# Patient Record
Sex: Female | Born: 1935 | Race: White | Hispanic: No | Marital: Married | Smoking: Never smoker
Health system: Southern US, Community
[De-identification: ages and names within clinical notes are randomized; demographics above are authoritative.]

## PROBLEM LIST (undated history)

## (undated) DIAGNOSIS — M1990 Unspecified osteoarthritis, unspecified site: Secondary | ICD-10-CM

## (undated) DIAGNOSIS — J449 Chronic obstructive pulmonary disease, unspecified: Secondary | ICD-10-CM

## (undated) DIAGNOSIS — M810 Age-related osteoporosis without current pathological fracture: Secondary | ICD-10-CM

## (undated) DIAGNOSIS — M51369 Other intervertebral disc degeneration, lumbar region without mention of lumbar back pain or lower extremity pain: Secondary | ICD-10-CM

## (undated) DIAGNOSIS — M4800 Spinal stenosis, site unspecified: Secondary | ICD-10-CM

## (undated) DIAGNOSIS — C50919 Malignant neoplasm of unspecified site of unspecified female breast: Secondary | ICD-10-CM

## (undated) DIAGNOSIS — M5430 Sciatica, unspecified side: Secondary | ICD-10-CM

## (undated) DIAGNOSIS — M5136 Other intervertebral disc degeneration, lumbar region: Secondary | ICD-10-CM

## (undated) DIAGNOSIS — I10 Essential (primary) hypertension: Secondary | ICD-10-CM

## (undated) DIAGNOSIS — C9200 Acute myeloblastic leukemia, not having achieved remission: Secondary | ICD-10-CM

## (undated) DIAGNOSIS — C801 Malignant (primary) neoplasm, unspecified: Secondary | ICD-10-CM

## (undated) HISTORY — DX: Essential (primary) hypertension: I10

## (undated) HISTORY — PX: ABDOMINAL HYSTERECTOMY: SHX81

## (undated) HISTORY — DX: Malignant (primary) neoplasm, unspecified: C80.1

## (undated) HISTORY — DX: Malignant neoplasm of unspecified site of unspecified female breast: C50.919

## (undated) HISTORY — DX: Sciatica, unspecified side: M54.30

## (undated) HISTORY — DX: Acute myeloblastic leukemia, not having achieved remission: C92.00

---

## 2010-02-24 DIAGNOSIS — E7800 Pure hypercholesterolemia, unspecified: Secondary | ICD-10-CM | POA: Insufficient documentation

## 2013-04-02 ENCOUNTER — Ambulatory Visit: Payer: Self-pay | Admitting: Family Medicine

## 2013-04-09 ENCOUNTER — Encounter: Payer: Self-pay | Admitting: General Surgery

## 2013-04-09 ENCOUNTER — Ambulatory Visit (INDEPENDENT_AMBULATORY_CARE_PROVIDER_SITE_OTHER): Payer: Medicare Other | Admitting: General Surgery

## 2013-04-09 ENCOUNTER — Ambulatory Visit: Payer: Self-pay | Admitting: General Surgery

## 2013-04-09 VITALS — BP 120/80 | HR 80 | Resp 16 | Ht 63.0 in | Wt 162.0 lb

## 2013-04-09 DIAGNOSIS — C50919 Malignant neoplasm of unspecified site of unspecified female breast: Secondary | ICD-10-CM

## 2013-04-09 DIAGNOSIS — C50912 Malignant neoplasm of unspecified site of left female breast: Secondary | ICD-10-CM

## 2013-04-09 DIAGNOSIS — N63 Unspecified lump in unspecified breast: Secondary | ICD-10-CM

## 2013-04-09 LAB — CBC WITH DIFFERENTIAL/PLATELET
HGB: 13.3 g/dL (ref 12.0–16.0)
Lymphocyte #: 2.2 10*3/uL (ref 1.0–3.6)
MCV: 91 fL (ref 80–100)
Monocyte #: 0.5 x10 3/mm (ref 0.2–0.9)
Monocyte %: 6.9 %
Neutrophil #: 4.1 10*3/uL (ref 1.4–6.5)
Neutrophil %: 59.4 %
Platelet: 177 10*3/uL (ref 150–440)
RDW: 14 % (ref 11.5–14.5)

## 2013-04-09 LAB — COMPREHENSIVE METABOLIC PANEL
Albumin: 4.1 g/dL (ref 3.4–5.0)
Anion Gap: 5 — ABNORMAL LOW (ref 7–16)
BUN: 17 mg/dL (ref 7–18)
Bilirubin,Total: 0.5 mg/dL (ref 0.2–1.0)
Chloride: 102 mmol/L (ref 98–107)
Creatinine: 0.76 mg/dL (ref 0.60–1.30)
EGFR (African American): >60
EGFR (Non-African Amer.): >60
Potassium: 4.1 mmol/L (ref 3.5–5.1)
SGOT(AST): 23 U/L (ref 15–37)
Sodium: 138 mmol/L (ref 136–145)
Total Protein: 7.3 g/dL (ref 6.4–8.2)

## 2013-04-09 NOTE — Patient Instructions (Addendum)
.     CARE AFTER BREAST BIOPSY  1. Leave the dressing on that your doctor applied after surgery. It is waterproof. You may bathe, shower and/or swim. The dressing will probably remain intact until your return office visit. If the dressing comes off, you will see small strips of tape against your skin on the incision. Do not remove these strips.  2. You may want to use a gauze,cloth or similar protection in your bra to prevent rubbing against your dressing and incision. This is not necessary, but you may feel more comfortable doing so.  3. It is recommended that you wear a bra day and night to give support to the breast. This will prevent the weight of the breast from pulling on the incision.  4. Your breast will feel hard and lumpy under the incision. Do not be alarmed. This is the underlying stitching of tissue. Softening of this tissue will occur in time.  5. Make sure you call the office and schedule an appointment in one week after your surgery. The office phone number is [phone]. The [facility] may have already done this for you.  6. You will notice about a week after your office visit that the strips of the tape on your incision will begin to loosen. These may then be removed.  7. Report to your doctor any of the following:  * Severe pain not relieved by your pain medication  *Redness of the incision  * Drainage from the incision  *Fever greater than 101 degrees  Follow up with MD based on results.  Patient to have labs and a chest x-ray at [facility] today.

## 2013-04-09 NOTE — Progress Notes (Signed)
Patient ID: Crystal Carpenter, female   DOB: 1924-07-17, 79 y.o.   MRN: [id number]  Chief Complaint  Patient presents with  . Other    mammogram    HPI Crystal Carpenter is a 79 y.o. female Who presents for a breast evaluation. The most recent mammogram was done on 04/02/13 cat 5  Patient does not  perform regular self breast checks and this was her first mammogram.  No family history of breast cancer. Patient felt an left breast mass on the central outer side a left breast after a chiropractic treatment. She felt it two weeks ago.   HPI  Past Medical History  Diagnosis Date  . Hypertension     Past Surgical History  Procedure Laterality Date  . Abdominal hysterectomy      No family history on file.  Social History History  Substance Use Topics  . Smoking status: Never Smoker   . Smokeless tobacco: Never Used  . Alcohol Use: No    Allergies  Allergen Reactions  . Shellfish Allergy Rash    Current Outpatient Prescriptions  Medication Sig Dispense Refill  . hydrochlorothiazide (HYDRODIURIL) 25 MG tablet Take 1 tablet by mouth daily.      . potassium chloride (MICRO-K) 10 MEQ CR capsule Take 1 capsule by mouth daily.      . sertraline (ZOLOFT) 50 MG tablet Take 1 tablet by mouth daily.      . Vitamin D, Ergocalciferol, (DRISDOL) 50000 UNITS CAPS Take 50,000 Units by mouth.       No current facility-administered medications for this visit.    Review of Systems Review of Systems  Constitutional: Negative.   Respiratory: Negative.   Cardiovascular: Negative.     Blood pressure 120/80, pulse 80, resp. rate 16, height 5\' 3"  (1.6 m), weight 162 lb (73.483 kg).  Physical Exam Physical Exam  Constitutional: She is oriented to person, place, and time. She appears well-developed and well-nourished.  Eyes: No scleral icterus.  Cardiovascular: Normal rate, regular rhythm, normal heart sounds and normal pulses.   No edema   Pulmonary/Chest: Breath sounds normal. Right breast  exhibits no inverted nipple, no mass, no nipple discharge, no skin change and no tenderness. Left breast exhibits mass (3cm mass at 2 o'clock). Left breast exhibits no inverted nipple, no nipple discharge, no skin change and no tenderness.  Lymphadenopathy:    She has no cervical adenopathy.    She has no axillary adenopathy.  Neurological: She is alert and oriented to person, place, and time.  Skin: Skin is warm and dry.    Data Reviewed Bilateral mammogram dated April 02, 2013 showed scattered fibroglandular tissue. The right breast was unremarkable. The left breast showed a mass with pleomorphic calcifications. Ultrasound at the 2:00 position showed a 1.5 x 2.0 x 2.7 cm mass suspicious for malignancy. BI-RAD-5.  Ultrasound examination completed in the 2:00 position, 6 centimeters from the nipple confirmed a 1.25 x 1.85 x 2.3 cm hypoechoic mass with dense shadowing and markedly irregular borders. The patient was amenable to core biopsy.  After skin preparation with alcohol 10 cc of 0.5% Xylocaine with 0.25% Marcaine with 1-200,000 units of epinephrine was utilized well-tolerated. ChloraPrep was applied to the skin. A 14-gauge Bard biopsy device was utilized and multiple core samples extracted. No bleeding was noted. The skin defect was closed with benzoin and Steri-Strips followed by Telfa and Tegaderm dressing. A postbiopsy clip was not placed. The procedure was well-tolerated. Instructions were provided. Assessment    Left breast  cancer.     Plan    The patient was notified of the clinical suspicion for malignancy. Arrangements were made for a followup examination early next week for biopsy results and laboratory studies have returned to review options.  The patient's case was briefly present at the [facility] tumor board. There was no interest in neoadjuvant chemotherapy.     Patient to have labs (CBC, Met C, CEA, and CA 27-29) and a chest x-ray PA and lateral at [facility] today. Labs and x-ray  will be completed at [facility] due to patient's request.   We will have the patient return to the office on Monday, 04-13-13, to discuss options.    Crystal Carpenter 04/09/2013, 9:36 PM

## 2013-04-10 ENCOUNTER — Telehealth: Payer: Self-pay | Admitting: General Surgery

## 2013-04-10 LAB — CANCER ANTIGEN 27.29: CA 27.29: 22.4 U/mL (ref 0.0–38.6)

## 2013-04-10 LAB — CEA: CEA: 1.3 ng/mL (ref 0.0–4.7)

## 2013-04-10 NOTE — Telephone Encounter (Signed)
The patient was notified that her biopsy completed yesterday did confirm the clinical impression of breast cancer. She is scheduled to come in on 04/14/2003 to discuss treatment options.

## 2013-04-13 ENCOUNTER — Ambulatory Visit (INDEPENDENT_AMBULATORY_CARE_PROVIDER_SITE_OTHER): Payer: Medicare Other | Admitting: General Surgery

## 2013-04-13 ENCOUNTER — Encounter: Payer: Self-pay | Admitting: General Surgery

## 2013-04-13 VITALS — BP 138/68 | HR 68 | Resp 14 | Ht 63.0 in | Wt 163.0 lb

## 2013-04-13 DIAGNOSIS — C801 Malignant (primary) neoplasm, unspecified: Secondary | ICD-10-CM | POA: Insufficient documentation

## 2013-04-13 DIAGNOSIS — C50912 Malignant neoplasm of unspecified site of left female breast: Secondary | ICD-10-CM

## 2013-04-13 DIAGNOSIS — C50919 Malignant neoplasm of unspecified site of unspecified female breast: Secondary | ICD-10-CM

## 2013-04-14 ENCOUNTER — Encounter: Payer: Self-pay | Admitting: General Surgery

## 2013-04-14 DIAGNOSIS — C50919 Malignant neoplasm of unspecified site of unspecified female breast: Secondary | ICD-10-CM

## 2013-04-14 DIAGNOSIS — C801 Malignant (primary) neoplasm, unspecified: Secondary | ICD-10-CM

## 2013-04-14 HISTORY — DX: Malignant neoplasm of unspecified site of unspecified female breast: C50.919

## 2013-04-14 HISTORY — DX: Malignant (primary) neoplasm, unspecified: C80.1

## 2013-04-14 NOTE — Progress Notes (Signed)
Patient ID: Crystal Carpenter, female   DOB: [birth year], 79 y.o.   MRN: [id number]   The patient returns today accompanied by her daughter, Crystal Carpenter, as well as a close friend who is present at the time of her biopsy to discuss her biopsy results. She had been contacted by phone yesterday with the results.  The patient shows minimal bruising from the biopsy.  Dispense better part 45 minutes to an hour to review the options for management. He she is aware that there is no research data for patient center age bracket, but considering her actual functional status she should be treated as a younger woman.  Breast conservation and mastectomy were presented is equivalent. The role of adjuvant radiation therapy was discussed. The possibility but not necessitate of adjuvant chemotherapy with reviewed.  An informational brochure was provided.  Baseline laboratory studies will be appropriate, and arrangements will be made for these to be completed in the near future.  The patient's daughter as the possibility of preoperative body imaging to look for metastatic disease. I explained it typically this is done only of preoperative laboratory chest x-ray studies are abnormal and then based on nodal status.  The patient will consider her options and notify the office of how she would like to proceed.

## 2013-04-16 LAB — PATHOLOGY

## 2013-04-20 ENCOUNTER — Other Ambulatory Visit: Payer: Self-pay | Admitting: General Surgery

## 2013-04-20 ENCOUNTER — Telehealth: Payer: Self-pay | Admitting: *Deleted

## 2013-04-20 DIAGNOSIS — C50912 Malignant neoplasm of unspecified site of left female breast: Secondary | ICD-10-CM

## 2013-04-20 NOTE — Telephone Encounter (Signed)
Patient has been scheduled for surgery at [facility] for 04-28-13. She is aware of all instructions and will call if she has further questions.

## 2013-04-22 ENCOUNTER — Ambulatory Visit: Payer: Self-pay | Admitting: General Surgery

## 2013-04-28 ENCOUNTER — Ambulatory Visit: Payer: Self-pay | Admitting: General Surgery

## 2013-04-28 DIAGNOSIS — C50419 Malignant neoplasm of upper-outer quadrant of unspecified female breast: Secondary | ICD-10-CM

## 2013-04-28 HISTORY — PX: BREAST SURGERY: SHX581

## 2013-04-29 ENCOUNTER — Encounter: Payer: Self-pay | Admitting: General Surgery

## 2013-05-01 ENCOUNTER — Ambulatory Visit (INDEPENDENT_AMBULATORY_CARE_PROVIDER_SITE_OTHER): Payer: Medicare Other | Admitting: *Deleted

## 2013-05-01 ENCOUNTER — Encounter: Payer: Self-pay | Admitting: General Surgery

## 2013-05-01 DIAGNOSIS — C50912 Malignant neoplasm of unspecified site of left female breast: Secondary | ICD-10-CM

## 2013-05-01 DIAGNOSIS — C50919 Malignant neoplasm of unspecified site of unspecified female breast: Secondary | ICD-10-CM

## 2013-05-01 NOTE — Progress Notes (Signed)
Patient came in today for an wound check on left mastectomy site. Changed and cleaned the area,added fluff and applied a wrapping of gauzes than an ace bandage was applied. Patient stated she has not had any pain since surgery. Will follow up on Tuesday with the nurse.

## 2013-05-05 ENCOUNTER — Ambulatory Visit (INDEPENDENT_AMBULATORY_CARE_PROVIDER_SITE_OTHER): Payer: Medicare Other | Admitting: *Deleted

## 2013-05-05 DIAGNOSIS — C50912 Malignant neoplasm of unspecified site of left female breast: Secondary | ICD-10-CM

## 2013-05-05 DIAGNOSIS — C50919 Malignant neoplasm of unspecified site of unspecified female breast: Secondary | ICD-10-CM

## 2013-05-05 NOTE — Patient Instructions (Addendum)
As scheduled with Dr Lemar Livings May use heating pad for comfort twice a day Continue to record JP drain output

## 2013-05-05 NOTE — Progress Notes (Addendum)
Patient came in today for an wound check on left mastectomy site. Area clean and no signs of infection. Changed and cleaned the area,added fluff and applied a wrapping of gauzes than an ace bandage was applied. Patient stated she has not had any pain since surgery. JP drain sheet present, output over 30ml a day

## 2013-05-11 ENCOUNTER — Ambulatory Visit (INDEPENDENT_AMBULATORY_CARE_PROVIDER_SITE_OTHER): Payer: Medicare Other | Admitting: General Surgery

## 2013-05-11 DIAGNOSIS — C50919 Malignant neoplasm of unspecified site of unspecified female breast: Secondary | ICD-10-CM

## 2013-05-11 DIAGNOSIS — C50912 Malignant neoplasm of unspecified site of left female breast: Secondary | ICD-10-CM

## 2013-05-12 ENCOUNTER — Encounter: Payer: Self-pay | Admitting: General Surgery

## 2013-05-12 NOTE — Progress Notes (Signed)
The patient presented for wound evaluation. The drain volume is still greater than 30 cc per day.  Examination of the incision shows good healing. No evidence of likely the fluid. No erythema or induration.  The compressive wrap will be discontinued.

## 2013-05-13 ENCOUNTER — Ambulatory Visit (INDEPENDENT_AMBULATORY_CARE_PROVIDER_SITE_OTHER): Payer: Medicare Other | Admitting: General Surgery

## 2013-05-13 ENCOUNTER — Encounter: Payer: Self-pay | Admitting: General Surgery

## 2013-05-13 VITALS — BP 150/78 | HR 72 | Resp 14 | Ht 65.0 in | Wt 164.0 lb

## 2013-05-13 DIAGNOSIS — C50912 Malignant neoplasm of unspecified site of left female breast: Secondary | ICD-10-CM

## 2013-05-13 DIAGNOSIS — C50919 Malignant neoplasm of unspecified site of unspecified female breast: Secondary | ICD-10-CM

## 2013-05-13 NOTE — Patient Instructions (Addendum)
Call if any problems. Resume regular activities. No heavy lifting with the left arm.

## 2013-05-13 NOTE — Progress Notes (Signed)
Patient ID: Crystal Carpenter, female   DOB: 09-Jan-1924, 79 y.o.   MRN: [id number]  Chief Complaint  Patient presents with  . Follow-up    post op mastectomy    HPI Crystal Carpenter is a 79 y.o. female here for Post Op left mastectomy. The surgery was done on 04/28/13. Patient is doing well and denies pain.   HPI  Past Medical History  Diagnosis Date  . Hypertension   . Cancer 2014    left breast     Past Surgical History  Procedure Laterality Date  . Abdominal hysterectomy      No family history on file.  Social History History  Substance Use Topics  . Smoking status: Never Smoker   . Smokeless tobacco: Never Used  . Alcohol Use: No    Allergies  Allergen Reactions  . Shellfish Allergy Rash    Current Outpatient Prescriptions  Medication Sig Dispense Refill  . hydrochlorothiazide (HYDRODIURIL) 25 MG tablet Take 1 tablet by mouth daily.      . potassium chloride (MICRO-K) 10 MEQ CR capsule Take 1 capsule by mouth daily.      . sertraline (ZOLOFT) 50 MG tablet Take 1 tablet by mouth daily.      . Vitamin D, Ergocalciferol, (DRISDOL) 50000 UNITS CAPS Take 50,000 Units by mouth.       No current facility-administered medications for this visit.    Review of Systems Review of Systems  Constitutional: Negative.   Respiratory: Negative.   Cardiovascular: Negative.     Blood pressure 150/78, pulse 72, resp. rate 14, height 5\' 5"  (1.651 m), weight 164 lb (74.39 kg).  Physical Exam Physical Exam Examination of the mastectomy site shows no evidence of loculated fluid. The previously placed JP drain was removed 2 days ago. Excellent shoulder range of motion.  Data Reviewed Pathology showed 6 axillary nodes positive.  Assessment    Doing well status post left modified radical mastectomy.     Plan    The patient was accompanied today by her daughter and a close friend. Her case was presented at the [facility] tumor board last week. Recommendations were for chest  wall/peripheral lymphatic radiation if metastatic disease was not identified, anti-estrogen therapy only if additional metastatic disease was detected on a PET CT. No plans for adjuvant chemotherapy if metastatic disease was identified was recommended. I see little advantage to chest wall radiation for this patient at this age. Options for formal medical oncology/radiation oncology consultation was offered. Otherwise, antiestrogen therapy with an aromatase inhibitor would be appropriate.  The patient will consider her options and will review them at her follow up in approximately 10 days.  She may resume driving if she feels comfortable incompetent and her ability to control a car.       [name] 05/13/2013, 9:10 PM

## 2013-05-25 ENCOUNTER — Encounter: Payer: Self-pay | Admitting: General Surgery

## 2013-05-25 ENCOUNTER — Ambulatory Visit (INDEPENDENT_AMBULATORY_CARE_PROVIDER_SITE_OTHER): Payer: Medicare Other | Admitting: General Surgery

## 2013-05-25 VITALS — BP 140/72 | HR 72 | Resp 12 | Ht 62.0 in | Wt 160.0 lb

## 2013-05-25 DIAGNOSIS — C50912 Malignant neoplasm of unspecified site of left female breast: Secondary | ICD-10-CM

## 2013-05-25 DIAGNOSIS — M810 Age-related osteoporosis without current pathological fracture: Secondary | ICD-10-CM

## 2013-05-25 DIAGNOSIS — C50919 Malignant neoplasm of unspecified site of unspecified female breast: Secondary | ICD-10-CM

## 2013-05-25 MED ORDER — LETROZOLE 2.5 MG PO TABS: 2.5000 mg | ORAL_TABLET | Freq: Every day | ORAL | Status: DC

## 2013-05-25 NOTE — Patient Instructions (Addendum)
Patient to return in one month.  This patient is to have a bone density test at the [facility] on 05-26-13 at 1:30 pm. She is aware of date, time, and instructions.

## 2013-05-25 NOTE — Progress Notes (Signed)
Patient ID: [name], female   DOB: 1924/02/05, 79 y.o.   MRN: [id number]  Chief Complaint  Patient presents with  . Other    breast ca    HPI Crystal Carpenter is a 79 y.o. female here today for following up from breast cancer.Post op left mastectomy. The surgery was done on 04/28/13. Patient is doing well and denies pain.   HPI  Past Medical History  Diagnosis Date  . Hypertension   . Cancer 2014    left breast     Past Surgical History  Procedure Laterality Date  . Abdominal hysterectomy      No family history on file.  Social History History  Substance Use Topics  . Smoking status: Never Smoker   . Smokeless tobacco: Never Used  . Alcohol Use: No    Allergies  Allergen Reactions  . Tape Rash  . Shellfish Allergy Rash    Current Outpatient Prescriptions  Medication Sig Dispense Refill  . hydrochlorothiazide (HYDRODIURIL) 25 MG tablet Take 1 tablet by mouth daily.      . potassium chloride (MICRO-K) 10 MEQ CR capsule Take 1 capsule by mouth daily.      . sertraline (ZOLOFT) 50 MG tablet Take 1 tablet by mouth daily.      . Vitamin D, Ergocalciferol, (DRISDOL) 50000 UNITS CAPS Take 50,000 Units by mouth.      . letrozole (FEMARA) 2.5 MG tablet Take 1 tablet (2.5 mg total) by mouth daily.  30 tablet  11   No current facility-administered medications for this visit.    Review of Systems Review of Systems  Constitutional: Negative.   Respiratory: Negative.   Cardiovascular: Negative.     Blood pressure 140/72, pulse 72, resp. rate 12, height 5\' 2"  (1.575 m), weight 160 lb (72.576 kg).  Physical Exam Physical Exam Examination of the mastectomy site showed no nausea loculated fluid.  Full range of shoulder motion.  There is slight fullness in the lower aspect of the left forearm. The patient reports that she is appreciated full dose in this area for years, well prior to her recent surgery.  Measurement of the upper extremities L. Location 15 cm superior  to the olecranon process as well as 10 and 20 cm below the olecranon process shielded the following results:  Right side: 33, 23, 17 centimeters.  Left side, 33, 26, 19 centimeters.  Data Reviewed No new data.  Assessment    Left breast cancer.     Plan    Options for referral to medical oncology/radiation oncology based on the [facility] Tumor board review versus proceeding with an aromatase inhibitor alone was discussed with the patient. She was still a little ambivalent about how she would like to proceed.   We'll go ahead and send a prescription for Femara 2.5 mg to be taken daily to her pharmacy. A bone density test will be scheduled. We'll plan for a followup examination in one month. Should she desire formal medical/radiation oncology evaluation she was encouraged to call.    This patient is to have a bone density test at the [facility] on 05-26-13 at 1:30 pm. She is aware of date, time, and instructions.    [name] 05/25/2013, 8:19 PM

## 2013-05-26 ENCOUNTER — Ambulatory Visit: Payer: Self-pay | Admitting: General Surgery

## 2013-05-26 LAB — HER-2 / NEU, FISH
Avg Num CEP17 probes/nucleus:: 2.5
HER-2/CEP17 Ratio: 1.38
Number of Observers:: 2
Number of Tumor Cells Counted:: 40

## 2013-05-26 LAB — ER/PR,IMMUNOHISTOCHEM,PARAFFIN
Estrogen Receptor IHC: >90 %
Progesterone Recp IP: 20 %

## 2013-06-30 ENCOUNTER — Encounter: Payer: Self-pay | Admitting: General Surgery

## 2013-06-30 ENCOUNTER — Ambulatory Visit (INDEPENDENT_AMBULATORY_CARE_PROVIDER_SITE_OTHER): Payer: Medicare Other | Admitting: General Surgery

## 2013-06-30 VITALS — BP 148/72 | HR 78 | Resp 14 | Ht 64.0 in | Wt 159.0 lb

## 2013-06-30 DIAGNOSIS — Z853 Personal history of malignant neoplasm of breast: Secondary | ICD-10-CM

## 2013-06-30 NOTE — Progress Notes (Signed)
Patient ID: Crystal Carpenter, female   DOB: 08-27-1924, 79 y.o.   MRN: [id number]  Chief Complaint  Patient presents with  . Follow-up    1 month follow up breast cancer    HPI Crystal Carpenter is a 79 y.o. female who presents for a 1 month follow up left breast cancer. The patient denies any new problems with her breast at this time. The patient reports that the numbness previously troubling her in the left axilla is improving. She is now able to put on her own deodorant. She was concerned about the mild swelling in the left forearm, and had discussed a sleeve with her primary care physician.  The patient is not having any difficulty with shoulder range of motion. She is tolerating the Femara well, but did have a question of whether it might be interfering with her sleep. Her daughter, who accompanied her today, reported that her mother is less energetic than she was before surgery, and is taking a couple hours rest in the afternoon. This may be disrupting her night sleep cycle.  When she last saw Dr. Sullivan Lone he encouraged her to increase her Zoloft back to 50 mg daily. This was started yesterday.  HPI  Past Medical History  Diagnosis Date  . Hypertension   . 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 Abdominal hysterectomy      History reviewed. No pertinent family history.  Social History History  Substance Use Topics  . Smoking status: Never Smoker   . Smokeless tobacco: Never Used  . Alcohol Use: No    Allergies  Allergen Reactions  . Tape Rash  . Shellfish Allergy Rash    Current Outpatient Prescriptions  Medication Sig Dispense Refill  . hydrochlorothiazide (HYDRODIURIL) 25 MG tablet Take 1 tablet by mouth daily.      [name] letrozole (FEMARA) 2.5 MG tablet Take 1 tablet (2.5 mg total) by mouth daily.  30 tablet  11  . potassium chloride (MICRO-K) 10 MEQ CR capsule Take 1  capsule by mouth daily.      . sertraline (ZOLOFT) 50 MG tablet Take 1 tablet by mouth daily.      . Vitamin D, Ergocalciferol, (DRISDOL) 50000 UNITS CAPS Take 50,000 Units by mouth.       No current facility-administered medications for this visit.    Review of Systems Review of Systems  Constitutional: Negative.   Respiratory: Negative.   Cardiovascular: Negative.     Blood pressure 148/72, pulse 78, resp. rate 14, height 5\' 4"  (1.626 m), weight 159 lb (72.122 kg).  Physical Exam Physical Exam  Constitutional: She is oriented to person, place, and time. She appears well-developed and well-nourished.  Neck: Neck supple.  Lymphadenopathy:    She has no cervical adenopathy.  Neurological: She is alert and oriented to person, place, and time.   She shows full range of motion on the left side.  The mastectomy site shows some soft irregularity, but no evidence of loculated fluid.  Measurement of the upper extremities 15 cm above as well as 10 and 20 cm below the olecranon process showed measurements as follows:  Right side: 31, 24 and 17 cm. Left side: 34, 26 and 19. The left side measurements show a 1 cm increase in the upper arm and stable measurements on the lower arm.     Data Reviewed No new data.  Assessment  Mild and nonprogressive left upper extremity edema  Tolerating Femara fairly well.      Plan    I again spoke to the patient whether she desired a formal medical oncology assessment. At this time she is still on the fence, so we'll continue to administer Femara at 2.5 mg daily.  With the modest and nonprogressive upper extremity swelling on the left, I think she would be far more frustrated by a compressive sleeve than benefit. I suggest we reassess the swelling in 3 months and make a decision at that time.  She was given a prescription for a left breast prosthesis and surgical bras x4.        [name] 06/30/2013, 8:36 PM

## 2013-10-15 DIAGNOSIS — M5430 Sciatica, unspecified side: Secondary | ICD-10-CM

## 2013-10-15 HISTORY — DX: Sciatica, unspecified side: M54.30

## 2013-10-15 HISTORY — PX: EPIDURAL BLOCK INJECTION: SHX1516

## 2013-10-22 ENCOUNTER — Ambulatory Visit: Payer: Medicare Other | Admitting: General Surgery

## 2013-11-04 ENCOUNTER — Encounter: Payer: Self-pay | Admitting: General Surgery

## 2013-11-04 ENCOUNTER — Ambulatory Visit (INDEPENDENT_AMBULATORY_CARE_PROVIDER_SITE_OTHER): Payer: Medicare Other | Admitting: General Surgery

## 2013-11-04 VITALS — BP 142/70 | HR 72 | Resp 12 | Ht 64.0 in | Wt 159.0 lb

## 2013-11-04 DIAGNOSIS — Z853 Personal history of malignant neoplasm of breast: Secondary | ICD-10-CM

## 2013-11-04 NOTE — Progress Notes (Signed)
Patient ID: Crystal Carpenter, female   DOB: [birth year], 80 y.o.   MRN: [id number]  Chief Complaint  Patient presents with  . Follow-up    breast cancer    HPI Crystal Carpenter is a 80 y.o. female.  Here today for follow up breast cancer. No new breast complaints.  States she is tolerating Femara she just notices that she gets tired easily.  HPI  Past Medical History  Diagnosis Date  . Hypertension   . 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 Abdominal hysterectomy    . Breast surgery Left 04-28-13    left mastectomy, SN biopsy    No family history on file.  Social History History  Substance Use Topics  . Smoking status: Never Smoker   . Smokeless tobacco: Never Used  . Alcohol Use: No    Allergies  Allergen Reactions  . Tape Rash  . Shellfish Allergy Rash    Current Outpatient Prescriptions  Medication Sig Dispense Refill  . hydrochlorothiazide (HYDRODIURIL) 25 MG tablet Take 1 tablet by mouth daily.      [name] letrozole (FEMARA) 2.5 MG tablet Take 1 tablet (2.5 mg total) by mouth daily.  30 tablet  11  . potassium chloride (MICRO-K) 10 MEQ CR capsule Take 1 capsule by mouth daily.      . sertraline (ZOLOFT) 50 MG tablet Take 1 tablet by mouth daily.      . Vitamin D, Ergocalciferol, (DRISDOL) 50000 UNITS CAPS Take 50,000 Units by mouth.       No current facility-administered medications for this visit.    Review of Systems Review of Systems  Constitutional: Positive for fatigue.  Respiratory: Negative.   Cardiovascular: Negative.     Blood pressure 142/70, pulse 72, resp. rate 12, height $RemoveBe'5\' 4"'[id number]$  (1.626 m), weight 159 lb (72.122 kg).  Physical Exam Physical Exam  Constitutional: She is oriented to person, place, and time. She appears well-developed and well-nourished.  Neck: Neck supple.  Cardiovascular: Normal rate, regular rhythm and normal heart sounds.   No lower  leg edema  Pulmonary/Chest: Effort normal and breath sounds normal. Right breast exhibits no inverted nipple, no mass, no nipple discharge, no skin change and no tenderness.  Left mastectomy site healed well.  Lymphadenopathy:    She has no cervical adenopathy.    She has no axillary adenopathy.  Minimal swelling left arm  Neurological: She is alert and oriented to person, place, and time.  Skin: Skin is warm and dry.    Data Reviewed    Measurement of the upper extremities 15 cm above as well as 10 and 20 cm below the olecranon process were obtained 3 months ago:  Right side: 31, 24 and 17 cm.  Left side: 34, 26 and 19.  Repeat determination today showed:  Right side 32, 23, 17 cm.  Left side: 34, 24.5, 17.5 cm.  Assessment    Benign exam.  Tolerating Femara therapy well.  No significant progression of upper extremity lymphedema.    Plan    Right mammogram and office visit in 6 months.       Robert Bellow 11/05/2013, 5:08 PM

## 2013-11-04 NOTE — Patient Instructions (Signed)
Continue self breast exams. Call office for any new breast issues or concerns. 

## 2013-11-05 ENCOUNTER — Encounter: Payer: Self-pay | Admitting: General Surgery

## 2013-12-29 ENCOUNTER — Ambulatory Visit: Payer: Self-pay | Admitting: Family Medicine

## 2014-05-06 ENCOUNTER — Encounter: Payer: Self-pay | Admitting: General Surgery

## 2014-05-13 ENCOUNTER — Ambulatory Visit: Payer: Medicare Other | Admitting: General Surgery

## 2014-05-19 ENCOUNTER — Other Ambulatory Visit: Payer: Self-pay | Admitting: General Surgery

## 2014-05-24 ENCOUNTER — Ambulatory Visit (INDEPENDENT_AMBULATORY_CARE_PROVIDER_SITE_OTHER): Payer: Medicare Other | Admitting: General Surgery

## 2014-05-24 ENCOUNTER — Encounter: Payer: Self-pay | Admitting: General Surgery

## 2014-05-24 VITALS — BP 158/76 | HR 72 | Resp 14 | Ht 64.0 in | Wt 156.0 lb

## 2014-05-24 DIAGNOSIS — Z853 Personal history of malignant neoplasm of breast: Secondary | ICD-10-CM

## 2014-05-24 NOTE — Patient Instructions (Addendum)
Patient to return in 6 months for office visit.

## 2014-05-24 NOTE — Progress Notes (Signed)
Patient ID: Crystal Carpenter, female   DOB: [birth year], 80 y.o.   MRN: [id number]  Chief Complaint  Patient presents with  . Follow-up    mamogram    HPI Crystal Carpenter is a 80 y.o. female. who presents for a breast evaluation. The most recent right breast mammogram was done on 05/06/14 at [facility].  Patient does perform regular self breast checks and gets regular mammograms done.    HPI  Past Medical History  Diagnosis Date  . Hypertension   . Cancer July 2014    T2,N2a, Modified radical mastectomy. 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 Malignant neoplasm of breast (female), unspecified site July 2014    Her case was presented at the [facility] tumor board in July 2014. Recommendations were for chest wall/peripheral lymphatic radiation if metastatic disease was not identified, anti-estrogen therapy only if additional metastatic disease was detected on a PET CT. No plans for adjuvant chemotherapy if metastatic disease was identified was recommended.    Past Surgical History  Procedure Laterality Date  . Abdominal hysterectomy    . Breast surgery Left 04-28-13    left mastectomy, SN biopsy    No family history on file.  Social History History  Substance Use Topics  . Smoking status: Never Smoker   . Smokeless tobacco: Never Used  . Alcohol Use: No    Allergies  Allergen Reactions  . Tape Rash  . Shellfish Allergy Rash    Current Outpatient Prescriptions  Medication Sig Dispense Refill  . Calcium 500 MG tablet Take 600 mg by mouth daily.      . hydrochlorothiazide (HYDRODIURIL) 25 MG tablet Take 1 tablet by mouth daily.      [name] letrozole (FEMARA) 2.5 MG tablet TAKE ONE (1) TABLET EACH DAY  30 tablet  11  . potassium chloride (MICRO-K) 10 MEQ CR capsule Take 1 capsule by mouth daily.      . sertraline (ZOLOFT) 50 MG tablet Take 1 tablet by mouth daily.       No current facility-administered medications for this visit.    Review of Systems Review of  Systems  Blood pressure 158/76, pulse 72, resp. rate 14, height $RemoveBe'5\' 4"'xXOlmwaId$  (1.626 m), weight 156 lb (70.761 kg).  Physical Exam Physical Exam  Constitutional: She is oriented to person, place, and time. She appears well-developed and well-nourished.  Eyes: Conjunctivae are normal. No scleral icterus.  Neck: Neck supple.  Cardiovascular: Normal rate, regular rhythm and normal heart sounds.   Pulmonary/Chest: Effort normal and breath sounds normal. Right breast exhibits no inverted nipple, no mass, no nipple discharge, no skin change and no tenderness.  Left mastectomy site clean and well healed.   Lymphadenopathy:    She has no cervical adenopathy.    She has no axillary adenopathy.  Neurological: She is alert and oriented to person, place, and time.  Skin: Skin is warm and dry.  Upper extremity assessment throughout but it was completed. Measurements were obtained 15 cm superior as well as 10 and 20 cm inferior to the olecranon process.  Right measurements: 33/22/17 respectively.  Left measurements: 33/25/18 respectively.  January 2005 measurements:  Right side 32, 23, 17 cm.  Left side: 34, 24.5, 17.5 cm.  No significant interval change.   Data Reviewed Right breast diagnostic mammogram dated May 06, 2014 was reviewed. BI-RAD-1.  Assessment    T2 carcinoma of the left breast, tolerating anti-estrogen therapy well.     Plan    Bone  density completed last year showed osteopenia.the patient has been encouraged to make use of 1200 mg of calcium a day with vitamin D. A repeat bone density test will be obtained in 2016.  We'll plan for a clinical examination 6 months. Repeat examination by mammography of the contralateral breast in one year.     PCP: Philemon Kingdom 05/24/2014, 8:42 PM

## 2014-06-08 DIAGNOSIS — M48061 Spinal stenosis, lumbar region without neurogenic claudication: Secondary | ICD-10-CM | POA: Insufficient documentation

## 2014-08-16 ENCOUNTER — Encounter: Payer: Self-pay | Admitting: General Surgery

## 2014-11-23 ENCOUNTER — Encounter: Payer: Self-pay | Admitting: General Surgery

## 2014-11-23 ENCOUNTER — Ambulatory Visit (INDEPENDENT_AMBULATORY_CARE_PROVIDER_SITE_OTHER): Payer: Medicare Other | Admitting: General Surgery

## 2014-11-23 VITALS — BP 146/70 | HR 78 | Resp 16 | Ht 64.0 in | Wt 156.0 lb

## 2014-11-23 DIAGNOSIS — Z853 Personal history of malignant neoplasm of breast: Secondary | ICD-10-CM | POA: Diagnosis not present

## 2014-11-23 NOTE — Patient Instructions (Signed)
The patient has been asked to return to the office in 6 monthswith a unilateral right breast diagnostic mammogram. Continue self breast exams. Call office for any new breast issues or concerns. 

## 2014-11-23 NOTE — Progress Notes (Signed)
Patient ID: Crystal Carpenter, female   DOB: 1923-10-22, 81 y.o.   MRN: [id number]  Chief Complaint  Patient presents with  . Follow-up    breast cancer    HPI Crystal Carpenter is a 81 y.o. female.  who presents for her follow up breast cancer and evaluation. Patient does perform regular self breast checks and gets regular mammograms done.  No new breast issues. States she is tolerating the letrozole well.   HPI  Past Medical History  Diagnosis Date  . Hypertension   . Cancer July 2014    T2,N2a, Modified radical mastectomy. 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 Malignant neoplasm of breast (female), unspecified site July 2014    Her case was presented at the [facility] tumor board in July 2014. Recommendations were for chest wall/peripheral lymphatic radiation if metastatic disease was not identified, anti-estrogen therapy only if additional metastatic disease was detected on a PET CT. No plans for adjuvant chemotherapy if metastatic disease was identified was recommended.  . Sciatic pain 2015    Past Surgical History  Procedure Laterality Date  . Abdominal hysterectomy    . Breast surgery Left 04-28-13    left mastectomy, SN biopsy  . Epidural block injection  2015    x3    No family history on file.  Social History History  Substance Use Topics  . Smoking status: Never Smoker   . Smokeless tobacco: Never Used  . Alcohol Use: No    Allergies  Allergen Reactions  . Tape Rash  . Shellfish Allergy Rash    Current Outpatient Prescriptions  Medication Sig Dispense Refill  . acetaminophen (TYLENOL) 325 MG tablet Take 650 mg by mouth every 6 (six) hours as needed.    [name] b complex vitamins tablet Take 1 tablet by mouth daily.    . Calcium 500 MG tablet Take 1,200 mg by mouth daily.     . hydrochlorothiazide (HYDRODIURIL) 25 MG tablet Take 1 tablet by mouth daily.    [name] ibuprofen (ADVIL,MOTRIN) 100 MG tablet Take 100 mg by mouth every 6 (six) hours as needed  for fever.    [name] letrozole (FEMARA) 2.5 MG tablet TAKE ONE (1) TABLET EACH DAY 30 tablet 11  . potassium chloride (MICRO-K) 10 MEQ CR capsule Take 1 capsule by mouth daily.    . sertraline (ZOLOFT) 50 MG tablet Take 1 tablet by mouth daily.     No current facility-administered medications for this visit.    Review of Systems Review of Systems  Constitutional: Negative.   Respiratory: Negative.   Cardiovascular: Negative.     Blood pressure 146/70, pulse 78, resp. rate 16, height 5' 4" (1.626 m), weight 156 lb (70.761 kg).  Physical Exam Physical Exam  Constitutional: She is oriented to person, place, and time. She appears well-developed and well-nourished.  Neck: Neck supple.  Cardiovascular: Normal rate, regular rhythm and normal heart sounds.   Pulmonary/Chest: Effort normal and breath sounds normal. Right breast exhibits no inverted nipple, no mass, no nipple discharge, no skin change and no tenderness.  Left mastectomy site well healed.  Musculoskeletal:  excellent upper arm range of motion.  Lymphadenopathy:    She has no cervical adenopathy.    She has no axillary adenopathy.  Neurological: She is alert and oriented to person, place, and time.  Skin: Skin is warm and dry.       Assessment    Doing well status post left modified radical mastectomy. Good tolerance  of antiestrogen therapy. No evidence of recurrent chest wall/axillary disease.    Plan        The patient has been asked to return to the office in 5 months with a unilateral right breast diagnostic mammogram. Continue self breast exams. Call office for any new breast issues or concerns.  PCP:  Philemon Kingdom 11/24/2014, 4:35 PM

## 2014-12-21 DIAGNOSIS — J309 Allergic rhinitis, unspecified: Secondary | ICD-10-CM | POA: Diagnosis not present

## 2014-12-21 DIAGNOSIS — I10 Essential (primary) hypertension: Secondary | ICD-10-CM | POA: Diagnosis not present

## 2014-12-21 DIAGNOSIS — K219 Gastro-esophageal reflux disease without esophagitis: Secondary | ICD-10-CM | POA: Diagnosis not present

## 2014-12-21 DIAGNOSIS — F329 Major depressive disorder, single episode, unspecified: Secondary | ICD-10-CM | POA: Diagnosis not present

## 2014-12-21 DIAGNOSIS — C50912 Malignant neoplasm of unspecified site of left female breast: Secondary | ICD-10-CM | POA: Diagnosis not present

## 2014-12-22 DIAGNOSIS — E780 Pure hypercholesterolemia: Secondary | ICD-10-CM | POA: Diagnosis not present

## 2014-12-22 DIAGNOSIS — I10 Essential (primary) hypertension: Secondary | ICD-10-CM | POA: Diagnosis not present

## 2014-12-22 LAB — TSH: TSH: 1.82 u[IU]/mL (ref 0.41–5.90)

## 2014-12-22 LAB — CBC AND DIFFERENTIAL
HCT: 41 % (ref 36–46)
HEMOGLOBIN: 13.4 g/dL (ref 12.0–16.0)
Neutrophils Absolute: 2 /uL
Platelets: 178 10*3/uL (ref 150–399)
WBC: 5.7 10*3/mL

## 2014-12-22 LAB — LIPID PANEL
CHOLESTEROL: 198 mg/dL (ref 0–200)
HDL: 39 mg/dL (ref 35–70)
LDL Cholesterol: 115 mg/dL
LDl/HDL Ratio: 2.9
TRIGLYCERIDES: 218 mg/dL — AB (ref 40–160)

## 2015-01-12 DIAGNOSIS — J019 Acute sinusitis, unspecified: Secondary | ICD-10-CM | POA: Diagnosis not present

## 2015-01-12 DIAGNOSIS — K219 Gastro-esophageal reflux disease without esophagitis: Secondary | ICD-10-CM | POA: Diagnosis not present

## 2015-01-12 DIAGNOSIS — R05 Cough: Secondary | ICD-10-CM | POA: Diagnosis not present

## 2015-01-24 LAB — BASIC METABOLIC PANEL
BUN: 17 mg/dL (ref 4–21)
Creatinine: 0.8 mg/dL (ref 0.5–1.1)
GLUCOSE: 114 mg/dL
Potassium: 4.3 mmol/L (ref 3.4–5.3)
Sodium: 139 mmol/L (ref 137–147)

## 2015-01-24 LAB — HEPATIC FUNCTION PANEL
ALK PHOS: 80 U/L (ref 25–125)
ALT: 14 U/L (ref 7–35)
AST: 20 U/L (ref 13–35)
Bilirubin, Total: 0.5 mg/dL

## 2015-01-25 DIAGNOSIS — J329 Chronic sinusitis, unspecified: Secondary | ICD-10-CM | POA: Diagnosis not present

## 2015-01-25 DIAGNOSIS — J310 Chronic rhinitis: Secondary | ICD-10-CM | POA: Diagnosis not present

## 2015-01-25 DIAGNOSIS — J0100 Acute maxillary sinusitis, unspecified: Secondary | ICD-10-CM | POA: Diagnosis not present

## 2015-01-25 DIAGNOSIS — R05 Cough: Secondary | ICD-10-CM | POA: Diagnosis not present

## 2015-01-26 DIAGNOSIS — I10 Essential (primary) hypertension: Secondary | ICD-10-CM | POA: Diagnosis not present

## 2015-01-26 DIAGNOSIS — K219 Gastro-esophageal reflux disease without esophagitis: Secondary | ICD-10-CM | POA: Diagnosis not present

## 2015-01-26 DIAGNOSIS — J019 Acute sinusitis, unspecified: Secondary | ICD-10-CM | POA: Diagnosis not present

## 2015-02-04 NOTE — Op Note (Signed)
PATIENT NAME:  Crystal Carpenter, Crystal Carpenter MR#:  [id number] DATE OF BIRTH:  03-27-1924  DATE OF PROCEDURE:  04/28/2013  PREOPERATIVE DIAGNOSIS: Left breast cancer.   POSTOPERATIVE DIAGNOSIS: Left breast cancer.   OPERATIVE PROCEDURE: Left mastectomy with sentinel node biopsy/axillary dissection.   SURGEON: Hervey Ard, MD.   ANESTHESIA: General by LMA under Dr.Bhandari  ESTIMATED BLOOD LOSS: Less than 50 mL.  FLUID REPLACEMENT: Crystalloid.   CLINICAL NOTE: This 81 year old woman was noted with a right breast mass. Biopsy showed evidence of invasive mammary carcinoma. Given her options for management, she desired to proceed to mastectomy. She was not felt to be a candidate for neoadjuvant chemotherapy, but sentinel node biopsy was planned to help assess the axilla for possible metastatic disease. She was injected with technetium sulfur colloid prior to the procedure.   OPERATIVE NOTE: The patient received Kefzol intravenously. The subareolar plexus was injected with 6 mL of methylene blue diluted 1:2 with normal saline. The breast was then prepped with ChloraPrep and draped. An elliptical incision was outlined on the breast. Skin flaps were then raised with the following borders: Superiorly to the clavicle, laterally the serratus muscle, inferiorly to the rectus fascia and medially to the sternum. Hemostasis was with electrocautery and 3-0 Vicryl ties. A single hot blue lymph node was identified in the axilla. This was reported to show macro-metastatic disease by Quay Burow, M.D. It was elected to proceed to axillary dissection. The axillary envelope was opened and the dissection completed making use of the Harmonic Scalpel and 0 silk sutures for hemostasis. The long thoracic nerve of Bell and thoracodorsal nerve, artery and vein bundles were identified and protected. Both nerves were functional at the end of the procedure. All of the tissue below the level of the axillary vein was swept inferiorly with  the breast specimen. The breast was sent fresh to pathology per protocol. The wound was irrigated with warm saline solution. A single Blake drain was brought out through the inferior medial flap and anchored in place with 3-0 nylon. The deep dermal layer was approximated with a running 2-0 Vicryl in 2 segments. Benzoin and Steri-Strips were applied followed by fluff gauze, Kerlix, and Ace wrap. The patient tolerated the procedure well and was taken to the recovery room in stable condition.   ____________________________ Robert Bellow, MD jwb:aw D: 04/28/2013 13:39:38 ET T: 04/28/2013 13:50:30 ET JOB#: [id number]  cc: Robert Bellow, MD, <Dictator> Richard L. Rosanna Randy, MD Shekia Kuper Amedeo Kinsman MD ELECTRONICALLY SIGNED 04/28/2013 14:25

## 2015-02-08 DIAGNOSIS — J301 Allergic rhinitis due to pollen: Secondary | ICD-10-CM | POA: Diagnosis not present

## 2015-02-08 DIAGNOSIS — J0100 Acute maxillary sinusitis, unspecified: Secondary | ICD-10-CM | POA: Diagnosis not present

## 2015-05-02 ENCOUNTER — Other Ambulatory Visit: Payer: Self-pay | Admitting: General Surgery

## 2015-05-03 ENCOUNTER — Encounter: Payer: Self-pay | Admitting: General Surgery

## 2015-05-03 DIAGNOSIS — Z9012 Acquired absence of left breast and nipple: Secondary | ICD-10-CM | POA: Diagnosis not present

## 2015-05-03 DIAGNOSIS — Z08 Encounter for follow-up examination after completed treatment for malignant neoplasm: Secondary | ICD-10-CM | POA: Diagnosis not present

## 2015-05-03 DIAGNOSIS — Z853 Personal history of malignant neoplasm of breast: Secondary | ICD-10-CM | POA: Diagnosis not present

## 2015-05-11 ENCOUNTER — Encounter: Payer: Self-pay | Admitting: General Surgery

## 2015-05-11 ENCOUNTER — Ambulatory Visit (INDEPENDENT_AMBULATORY_CARE_PROVIDER_SITE_OTHER): Payer: Medicare Other | Admitting: General Surgery

## 2015-05-11 VITALS — BP 144/78 | HR 64 | Resp 14 | Ht 62.0 in | Wt 156.0 lb

## 2015-05-11 DIAGNOSIS — Z853 Personal history of malignant neoplasm of breast: Secondary | ICD-10-CM

## 2015-05-11 DIAGNOSIS — C50912 Malignant neoplasm of unspecified site of left female breast: Secondary | ICD-10-CM

## 2015-05-11 MED ORDER — LETROZOLE 2.5 MG PO TABS: 2.5000 mg | ORAL_TABLET | Freq: Every day | ORAL | Status: DC

## 2015-05-11 NOTE — Progress Notes (Signed)
Patient ID: Crystal Carpenter, female   DOB: July 21, 1924, 82 y.o.   MRN: [id number]  Chief Complaint  Patient presents with  . Follow-up    mammogram    HPI Crystal Carpenter is a 81 y.o. female. who presents for a breast evaluation. The most recent right mammogram was done on 05-03-15.  Patient does perform regular self breast checks and gets regular mammograms done.  No new breast issues. The patient reports that her calcium supplementations were stopped the spring by her PCP due to an elevated serum calcium. Recheck is planned for later this year.  The patient reports no unusual bone pain outside of her previously documented left hip and low back arthritis symptoms.  Functional level is excellent. She continues to live independently with her husband.[name] HPI  Past Medical History  Diagnosis Date  . Hypertension   . Cancer July 2014    T2,N2a, Modified radical mastectomy. 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 Malignant neoplasm of breast (female), unspecified site July 2014    Her case was presented at the [facility] tumor board in July 2014. Recommendations were for chest wall/peripheral lymphatic radiation if metastatic disease was not identified, anti-estrogen therapy only if additional metastatic disease was detected on a PET CT. No plans for adjuvant chemotherapy if metastatic disease was identified was recommended.  . Sciatic pain 2015    Past Surgical History  Procedure Laterality Date  . Abdominal hysterectomy    . Breast surgery Left 04-28-13    left mastectomy, SN biopsy  . Epidural block injection  2015    x3    No family history on file.  Social History History  Substance Use Topics  . Smoking status: Never Smoker   . Smokeless tobacco: Never Used  . Alcohol Use: No    Allergies  Allergen Reactions  . Tape Rash  . Shellfish Allergy Rash    Current Outpatient Prescriptions  Medication Sig Dispense Refill  . acetaminophen (TYLENOL) 325 MG tablet  Take 650 mg by mouth every 6 (six) hours as needed.    [name] b complex vitamins tablet Take 1 tablet by mouth daily.    . hydrochlorothiazide (HYDRODIURIL) 25 MG tablet Take 1 tablet by mouth daily.    [name] ibuprofen (ADVIL,MOTRIN) 100 MG tablet Take 100 mg by mouth every 6 (six) hours as needed for fever.    [name] letrozole (FEMARA) 2.5 MG tablet TAKE ONE (1) TABLET EACH DAY 30 tablet 11  . potassium chloride (MICRO-K) 10 MEQ CR capsule Take 1 capsule by mouth daily.    . sertraline (ZOLOFT) 50 MG tablet Take 1 tablet by mouth daily.    [name] letrozole (FEMARA) 2.5 MG tablet Take 1 tablet (2.5 mg total) by mouth daily. 90 tablet 3   No current facility-administered medications for this visit.    Review of Systems Review of Systems  Constitutional: Negative.   Respiratory: Negative.   Cardiovascular: Negative.     Blood pressure 144/78, pulse 64, resp. rate 14, height $RemoveBe'5\' 2"'iOhSyzZoF$  (1.575 m), weight 156 lb (70.761 kg).  Physical Exam Physical Exam  Constitutional: She is oriented to person, place, and time. She appears well-developed and well-nourished.  HENT:  Mouth/Throat: Oropharynx is clear and moist.  Eyes: Conjunctivae are normal. No scleral icterus.  Neck: Neck supple.  Cardiovascular: Normal rate, regular rhythm and normal heart sounds.   Pulmonary/Chest: Effort normal and breath sounds normal. Right breast exhibits no inverted nipple, no mass, no nipple discharge, no skin  change and no tenderness.  Left mastectomy site well healed.  Lymphadenopathy:    She has no cervical adenopathy.    She has no axillary adenopathy.       Left: No supraclavicular adenopathy present.  Neurological: She is alert and oriented to person, place, and time.  Skin: Skin is warm and dry.  Psychiatric: She has a normal mood and affect.    Data Reviewed Right breast mammogram dated 05/03/2015 was reviewed. No interval change. BI-RADS-1.  Assessment    Benign breast exam. No evidence of recurrent  tumor. Hypercalcemia by report.    Plan    The patient will continue on Femara 2.5 mg daily. She requested a 90 day supply.  The patient was encouraged to report any unusual bony pain (possibility of metastatic disease in light of history of elevated serum calcium) or likely secondary to hyperparathyroidism.      The patient has been asked to return to the office in one year with a unilateral right breast screening mammogram.  PCP:  Cranford Mon, Donnal Moat, Forest Gleason 05/11/2015, 10:18 AM

## 2015-05-11 NOTE — Patient Instructions (Addendum)
Continue self breast exams. Call office for any new breast issues or concerns.  The patient has been asked to return to the office in one year with a unilateral right breast screening mammogram. 

## 2015-06-02 ENCOUNTER — Other Ambulatory Visit: Payer: Self-pay | Admitting: Family Medicine

## 2015-08-10 ENCOUNTER — Ambulatory Visit (INDEPENDENT_AMBULATORY_CARE_PROVIDER_SITE_OTHER): Payer: Medicare Other

## 2015-08-10 DIAGNOSIS — Z23 Encounter for immunization: Secondary | ICD-10-CM

## 2015-08-25 DIAGNOSIS — M129 Arthropathy, unspecified: Secondary | ICD-10-CM | POA: Insufficient documentation

## 2015-08-25 DIAGNOSIS — F32A Depression, unspecified: Secondary | ICD-10-CM | POA: Insufficient documentation

## 2015-08-25 DIAGNOSIS — N399 Disorder of urinary system, unspecified: Secondary | ICD-10-CM | POA: Insufficient documentation

## 2015-08-25 DIAGNOSIS — J309 Allergic rhinitis, unspecified: Secondary | ICD-10-CM | POA: Insufficient documentation

## 2015-08-25 DIAGNOSIS — F329 Major depressive disorder, single episode, unspecified: Secondary | ICD-10-CM | POA: Insufficient documentation

## 2015-08-30 ENCOUNTER — Encounter: Payer: Self-pay | Admitting: Family Medicine

## 2015-08-30 ENCOUNTER — Ambulatory Visit (INDEPENDENT_AMBULATORY_CARE_PROVIDER_SITE_OTHER): Payer: Medicare Other | Admitting: Family Medicine

## 2015-08-30 NOTE — Progress Notes (Signed)
Patient ID: Crystal Carpenter, female   DOB: 01/02/1924, 81 y.o.   MRN: [id number]       [name]    DOB: [birth year]   81 y.o.   MRN: [id number] Visit Date: 08/30/2015  Today's Provider: Wilhemena Durie, MD   Chief Complaint  Patient presents with  . Depression    7 month F/U.    Subjective:    Depression      The problem is unchanged.  Associated symptoms include no decreased concentration, no fatigue, no helplessness, no hopelessness, does not have insomnia, not irritable, no restlessness, no appetite change, no body aches, no myalgias, no headaches, no indigestion and no suicidal ideas.     The symptoms are aggravated by nothing.  Compliance with treatment is good.  Previous treatment provided significant relief. Hypercalcemia Patient reports that on last OV she was told to D/C Calcium supplements. She reports that she is due for a recheck today.   Breast Cancer Doing well. In remission.  Allergies  Allergen Reactions  . Tape Rash  . Shellfish Allergy Rash   Previous Medications   ACETAMINOPHEN (TYLENOL) 325 MG TABLET    Take 650 mg by mouth every 6 (six) hours as needed.   B COMPLEX VITAMINS TABLET    Take 1 tablet by mouth daily.   HYDROCHLOROTHIAZIDE (HYDRODIURIL) 25 MG TABLET    Take 1 tablet by mouth daily.   IBUPROFEN (ADVIL,MOTRIN) 100 MG TABLET    Take 100 mg by mouth every 6 (six) hours as needed for fever.   LETROZOLE (FEMARA) 2.5 MG TABLET    TAKE ONE (1) TABLET EACH DAY   LETROZOLE (FEMARA) 2.5 MG TABLET    Take 1 tablet (2.5 mg total) by mouth daily.   NAPROXEN SODIUM (ALEVE) 220 MG TABLET    Take by mouth.   POTASSIUM CHLORIDE (MICRO-K) 10 MEQ CR CAPSULE    TAKE ONE (1) CAPSULE EACH DAY   SALINE 0.2 % SOLN       SERTRALINE (ZOLOFT) 50 MG TABLET    Take 1 tablet by mouth daily.    Review of Systems  Constitutional: Negative.  Negative for appetite change and fatigue.  HENT: Negative.   Eyes: Negative.   Respiratory: Negative.     Cardiovascular: Negative.   Endocrine: Negative.   Musculoskeletal: Negative.  Negative for myalgias.  Allergic/Immunologic: Negative.   Neurological: Negative.  Negative for headaches.  Hematological: Negative.   Psychiatric/Behavioral: Positive for depression. Negative for suicidal ideas and decreased concentration. The patient does not have insomnia.     Social History  Substance Use Topics  . Smoking status: Never Smoker   . Smokeless tobacco: Never Used  . Alcohol Use: No   Objective:   BP 134/62 mmHg  Pulse 80  Temp(Src) 98.3 F (36.8 C)  Resp 16  Wt 153 lb (69.4 kg)  Physical Exam  Constitutional: She is oriented to person, place, and time. She appears well-developed and well-nourished. She is not irritable.  HENT:  Head: Normocephalic and atraumatic.  Right Ear: External ear normal.  Left Ear: External ear normal.  Nose: Nose normal.  Eyes: Conjunctivae and EOM are normal. Pupils are equal, round, and reactive to light.  Neck: Neck supple.  Cardiovascular: Normal rate, regular rhythm and normal heart sounds.   Pulmonary/Chest: Effort normal and breath sounds normal.  Abdominal: Soft.  Neurological: She is alert and oriented to person, place, and time.  Skin: Skin is warm and dry.  Psychiatric:  She has a normal mood and affect. Her behavior is normal. Judgment and thought content normal.        Assessment & Plan:     1. Hypercalcemia  - CBC with Differential - Renal Function Panel - Calcium, ionized - Parathyroid hormone, intact (no Ca) 2.Breast Cancer In remission. 3.HTN controlled 4.MDD In remission.  I have done the exam and reviewed the above chart and it is accurate to the best of my knowledge.       Rhandi Despain Cranford Mon, MD  [facility]

## 2015-08-31 LAB — CBC WITH DIFFERENTIAL/PLATELET
BASOS: 0 %
Basophils Absolute: 0 10*3/uL (ref 0.0–0.2)
EOS (ABSOLUTE): 0.2 10*3/uL (ref 0.0–0.4)
Eos: 3 %
HEMOGLOBIN: 12.5 g/dL (ref 11.1–15.9)
Hematocrit: 37.6 % (ref 34.0–46.6)
IMMATURE GRANS (ABS): 0 10*3/uL (ref 0.0–0.1)
Immature Granulocytes: 0 %
LYMPHS: 42 %
Lymphocytes Absolute: 2.6 10*3/uL (ref 0.7–3.1)
MCH: 28.2 pg (ref 26.6–33.0)
MCHC: 33.2 g/dL (ref 31.5–35.7)
MCV: 85 fL (ref 79–97)
MONOCYTES: 7 %
Monocytes Absolute: 0.4 10*3/uL (ref 0.1–0.9)
NEUTROS ABS: 3 10*3/uL (ref 1.4–7.0)
Neutrophils: 48 %
Platelets: 156 10*3/uL (ref 150–379)
RBC: 4.44 x10E6/uL (ref 3.77–5.28)
RDW: 17.3 % — ABNORMAL HIGH (ref 12.3–15.4)
WBC: 6.2 10*3/uL (ref 3.4–10.8)

## 2015-08-31 LAB — RENAL FUNCTION PANEL
ALBUMIN: 4.5 g/dL (ref 3.5–4.7)
BUN/Creatinine Ratio: 22 (ref 11–26)
BUN: 15 mg/dL (ref 8–27)
CO2: 26 mmol/L (ref 18–29)
CREATININE: 0.67 mg/dL (ref 0.57–1.00)
Calcium: 10.4 mg/dL — ABNORMAL HIGH (ref 8.7–10.3)
Chloride: 96 mmol/L — ABNORMAL LOW (ref 97–106)
GFR calc Af Amer: 90 mL/min/{1.73_m2} (ref >59)
GFR, EST NON AFRICAN AMERICAN: 78 mL/min/{1.73_m2} (ref >59)
Glucose: 98 mg/dL (ref 65–99)
PHOSPHORUS: 3.2 mg/dL (ref 2.5–4.5)
Potassium: 3.7 mmol/L (ref 3.5–5.2)
Sodium: 140 mmol/L (ref 136–144)

## 2015-08-31 LAB — PARATHYROID HORMONE, INTACT (NO CA): PTH: 37 pg/mL (ref 15–65)

## 2015-08-31 LAB — CALCIUM, IONIZED: Calcium, Ion: 5.6 mg/dL (ref 4.5–5.6)

## 2015-09-14 DIAGNOSIS — H1089 Other conjunctivitis: Secondary | ICD-10-CM | POA: Diagnosis not present

## 2015-09-14 DIAGNOSIS — H43813 Vitreous degeneration, bilateral: Secondary | ICD-10-CM | POA: Diagnosis not present

## 2015-09-14 DIAGNOSIS — H5203 Hypermetropia, bilateral: Secondary | ICD-10-CM | POA: Diagnosis not present

## 2015-09-14 DIAGNOSIS — Z961 Presence of intraocular lens: Secondary | ICD-10-CM | POA: Diagnosis not present

## 2015-09-14 DIAGNOSIS — D2312 Other benign neoplasm of skin of left eyelid, including canthus: Secondary | ICD-10-CM | POA: Diagnosis not present

## 2015-10-12 DIAGNOSIS — H52223 Regular astigmatism, bilateral: Secondary | ICD-10-CM | POA: Diagnosis not present

## 2015-10-12 DIAGNOSIS — D2312 Other benign neoplasm of skin of left eyelid, including canthus: Secondary | ICD-10-CM | POA: Diagnosis not present

## 2015-10-12 DIAGNOSIS — H43813 Vitreous degeneration, bilateral: Secondary | ICD-10-CM | POA: Diagnosis not present

## 2015-10-12 DIAGNOSIS — H5203 Hypermetropia, bilateral: Secondary | ICD-10-CM | POA: Diagnosis not present

## 2015-10-12 DIAGNOSIS — Z961 Presence of intraocular lens: Secondary | ICD-10-CM | POA: Diagnosis not present

## 2015-11-07 ENCOUNTER — Other Ambulatory Visit: Payer: Self-pay | Admitting: Family Medicine

## 2015-11-07 DIAGNOSIS — I10 Essential (primary) hypertension: Secondary | ICD-10-CM

## 2015-12-05 DIAGNOSIS — J069 Acute upper respiratory infection, unspecified: Secondary | ICD-10-CM | POA: Diagnosis not present

## 2015-12-05 DIAGNOSIS — J0100 Acute maxillary sinusitis, unspecified: Secondary | ICD-10-CM | POA: Diagnosis not present

## 2015-12-21 ENCOUNTER — Encounter: Payer: Self-pay | Admitting: Family Medicine

## 2015-12-21 ENCOUNTER — Ambulatory Visit (INDEPENDENT_AMBULATORY_CARE_PROVIDER_SITE_OTHER): Payer: Medicare Other | Admitting: Family Medicine

## 2015-12-21 VITALS — BP 118/60 | HR 80 | Temp 98.6°F | Resp 16 | Wt 151.0 lb

## 2015-12-21 DIAGNOSIS — J069 Acute upper respiratory infection, unspecified: Secondary | ICD-10-CM | POA: Diagnosis not present

## 2015-12-21 DIAGNOSIS — J309 Allergic rhinitis, unspecified: Secondary | ICD-10-CM

## 2015-12-21 MED ORDER — MONTELUKAST SODIUM 10 MG PO TABS: 10.0000 mg | ORAL_TABLET | Freq: Every day | ORAL | Status: DC

## 2015-12-21 MED ORDER — AZITHROMYCIN 250 MG PO TABS: ORAL_TABLET | ORAL | Status: AC

## 2015-12-21 NOTE — Progress Notes (Signed)
[name]    DOB: Mar 24, 1924   81 y.o.   MRN: [id number] Visit Date: 12/21/2015  Today's Provider: Lelon Huh, MD   Chief Complaint  Patient presents with  . URI   Subjective:    URI  Episode onset: 2 weeks ago. The problem has been unchanged. There has been no fever. Associated symptoms include congestion, coughing (productive with clear mucus), ear pain, headaches, nausea, neck pain, rhinorrhea, sinus pain, sneezing and a sore throat. Pertinent negatives include no abdominal pain, chest pain, vomiting or wheezing.  Patient was prescribed Cefdinir and Tessalon Perles  By Dr. Pryor Ochoa on 12/12/2015 for cough, congestion and sinus drainage which started the day before.. She states that she took her last antibiotic pill 4 days ago and still had lingering cough. Patient was advised by Dr. Pryor Ochoa that she should take Mucinex. Patient reports she has been taking Mucinex and Tessalon Perles and symptoms have not resolved. Patient sill has itchy eyes, ear pain and productive cough with clear sputum. Had low grade fever the first few days.      Allergies  Allergen Reactions  . Tape Rash  . Sulfa Antibiotics   . Shellfish Allergy Rash   Previous Medications   ACETAMINOPHEN (TYLENOL) 325 MG TABLET    Take 650 mg by mouth every 6 (six) hours as needed.   B COMPLEX VITAMINS TABLET    Take 1 tablet by mouth daily.   HYDROCHLOROTHIAZIDE (HYDRODIURIL) 25 MG TABLET    TAKE ONE (1) TABLET EACH DAY   IBUPROFEN (ADVIL,MOTRIN) 100 MG TABLET    Take 100 mg by mouth every 6 (six) hours as needed for fever.   LETROZOLE (FEMARA) 2.5 MG TABLET    Take 1 tablet (2.5 mg total) by mouth daily.   NAPROXEN SODIUM (ALEVE) 220 MG TABLET    Take by mouth.   POTASSIUM CHLORIDE (MICRO-K) 10 MEQ CR CAPSULE    TAKE ONE (1) CAPSULE EACH DAY   SALINE 0.2 % SOLN       SERTRALINE (ZOLOFT) 50 MG TABLET    Take 1 tablet by mouth daily.    Review of Systems  Constitutional: Positive for chills  and fatigue. Negative for fever and appetite change.  HENT: Positive for congestion, dental problem, ear pain, rhinorrhea, sneezing, sore throat and voice change. Negative for mouth sores, nosebleeds and postnasal drip.   Eyes: Positive for itching.  Respiratory: Positive for cough (productive with clear mucus) and shortness of breath. Negative for chest tightness and wheezing.   Cardiovascular: Negative for chest pain and palpitations.  Gastrointestinal: Positive for nausea. Negative for vomiting and abdominal pain.  Musculoskeletal: Positive for neck pain.  Neurological: Positive for headaches. Negative for dizziness, weakness and light-headedness.    Social History  Substance Use Topics  . Smoking status: Never Smoker   . Smokeless tobacco: Never Used  . Alcohol Use: No   Objective:   BP 118/60 mmHg  Pulse 80  Temp(Src) 98.6 F (37 C) (Oral)  Resp 16  Wt 151 lb (68.493 kg)  SpO2 95%  Physical Exam  General Appearance:    Alert, cooperative, no distress  HENT:   bilateral TM normal without fluid or infection, neck without nodes and throat normal without erythema or exudate. Post nasal drainage noted.   Eyes:    PERRL, conjunctiva/corneas clear, EOM's intact       Lungs:     Clear to auscultation bilaterally, respirations unlabored  Heart:  Regular rate and rhythm  Neurologic:   Awake, alert, oriented x 3. No apparent focal neurological           defect.          Assessment & Plan:     1. Allergic rhinitis, unspecified allergic rhinitis type  - montelukast (SINGULAIR) 10 MG tablet; Take 1 tablet (10 mg total) by mouth at bedtime.  Dispense: 30 tablet; Refill: 0  2. Upper respiratory infection  - azithromycin (ZITHROMAX) 250 MG tablet; 2 by mouth today, then 1 daily for 4 days  Dispense: 6 tablet; Refill: 0  Call if symptoms change or if not rapidly improving.           Lelon Huh, MD  [place]

## 2015-12-28 ENCOUNTER — Ambulatory Visit (INDEPENDENT_AMBULATORY_CARE_PROVIDER_SITE_OTHER): Payer: Medicare Other | Admitting: Family Medicine

## 2015-12-28 ENCOUNTER — Encounter: Payer: Self-pay | Admitting: Family Medicine

## 2015-12-28 VITALS — BP 142/58 | HR 78 | Temp 97.6°F | Resp 18 | Ht 62.0 in | Wt 151.0 lb

## 2015-12-28 DIAGNOSIS — Z0000 Encounter for general adult medical examination without abnormal findings: Secondary | ICD-10-CM

## 2015-12-28 DIAGNOSIS — R059 Cough, unspecified: Secondary | ICD-10-CM

## 2015-12-28 DIAGNOSIS — K219 Gastro-esophageal reflux disease without esophagitis: Secondary | ICD-10-CM

## 2015-12-28 DIAGNOSIS — I10 Essential (primary) hypertension: Secondary | ICD-10-CM | POA: Diagnosis not present

## 2015-12-28 DIAGNOSIS — R05 Cough: Secondary | ICD-10-CM | POA: Diagnosis not present

## 2015-12-28 DIAGNOSIS — J40 Bronchitis, not specified as acute or chronic: Secondary | ICD-10-CM

## 2015-12-28 MED ORDER — DOXYCYCLINE HYCLATE 100 MG PO TABS: 100.0000 mg | ORAL_TABLET | Freq: Two times a day (BID) | ORAL | Status: DC

## 2015-12-28 NOTE — Progress Notes (Signed)
Patient ID: Crystal Carpenter, female   DOB: 02/10/1924, 82 y.o.   MRN: [id number] Visit Date: 12/28/2015  Today's Provider: Wilhemena Durie, MD   Chief Complaint  Patient presents with  . Medicare Wellness   Subjective:   Crystal Carpenter is a 82 y.o. female who presents today for her Subsequent Annual Wellness Visit. She feels fairly well. She reports she is not exercising. She reports she is sleeping fairly well. She has had a URI and saw Dr. Caryn Section a week ago, was given a Zpak. She feels a little better but not completely well.  She knows she is here for a wellness exam but she also wishes to have her cough/URI treated today. Colonoscopy- Never BMD- 05/26/13 Osteopenia Mammogram- 04/02/13 Diagnostic with biopsy. Followed by Dr. Terri Piedra  Pt is due for a pneumovax but she has a URI today and feeling as well as she would like.  Also time for routine lab work for her allergies, depression, hypertension, hyperlipidemia. Allergies and osteoarthritis are present but stable. Her depression is well controlled and has been for several years. She understands that she needs to be on the sertraline for the rest of her life and she is pleased to do this Immunization History  Administered Date(s) Administered  . Influenza, High Dose Seasonal PF 08/10/2015  . Pneumococcal Conjugate-13 08/24/2014  . Td 07/23/2006     Review of Systems  Constitutional: Negative.   HENT: Positive for congestion, rhinorrhea, sinus pressure, sneezing and sore throat.   Eyes: Positive for itching.  Respiratory: Positive for cough.   Cardiovascular: Negative.   Gastrointestinal: Negative.   Endocrine: Negative.   Genitourinary: Negative.   Musculoskeletal: Positive for arthralgias.  Skin: Negative.   Allergic/Immunologic: Negative.   Neurological: Negative.   Hematological: Negative.   Psychiatric/Behavioral: Negative.     Patient Active Problem List   Diagnosis Date Noted  . Allergic rhinitis 08/25/2015  .  Arthropathia 08/25/2015  . Clinical depression 08/25/2015  . Urinary system disease 08/25/2015  . Calcium blood increased 08/25/2015  . Lumbar canal stenosis 06/08/2014  . Personal history of malignant neoplasm of breast 06/30/2013  . Malignant neoplasm of breast (female) ([place]) 05/25/2013  . Cancer ([place])   . Breast cancer ([place]) 04/09/2013  . Acid reflux 02/24/2010  . Hypercholesterolemia 02/24/2010  . Avitaminosis D 02/28/2009  . BP (high blood pressure) 07/01/2007    Social History   Social History  . Marital Status: Married    Spouse Name: N/A  . Number of Children: N/A  . Years of Education: N/A   Occupational History  . Not on file.   Social History Main Topics  . Smoking status: Never Smoker   . Smokeless tobacco: Never Used  . Alcohol Use: No  . Drug Use: No  . Sexual Activity: Not on file   Other Topics Concern  . Not on file   Social History Narrative    Past Surgical History  Procedure Laterality Date  . Abdominal hysterectomy    . Breast surgery Left 04-28-13    left mastectomy, SN biopsy  . Epidural block injection  2015    x3    Her family history includes Heart disease in her father; Parkinson's disease in her brother and sister; Stroke in her father and mother.    Outpatient Prescriptions Prior to Visit  Medication Sig Dispense Refill  . acetaminophen (TYLENOL) 325 MG tablet Take 650 mg by mouth every 6 (six) hours as needed.    [name] b complex  vitamins tablet Take 1 tablet by mouth daily.    . hydrochlorothiazide (HYDRODIURIL) 25 MG tablet TAKE ONE (1) TABLET EACH DAY 90 tablet 1  . ibuprofen (ADVIL,MOTRIN) 100 MG tablet Take 100 mg by mouth every 6 (six) hours as needed for fever.    [name] letrozole (FEMARA) 2.5 MG tablet Take 1 tablet (2.5 mg total) by mouth daily. 90 tablet 3  . montelukast (SINGULAIR) 10 MG tablet Take 1 tablet (10 mg total) by mouth at bedtime. 30 tablet 0  . naproxen sodium (ALEVE) 220 MG tablet Take by mouth.    . potassium  chloride (MICRO-K) 10 MEQ CR capsule TAKE ONE (1) CAPSULE EACH DAY 30 capsule 12  . Saline 0.2 % SOLN     . sertraline (ZOLOFT) 50 MG tablet Take 1 tablet by mouth daily.     No facility-administered medications prior to visit.    Allergies  Allergen Reactions  . Tape Rash  . Sulfa Antibiotics   . Shellfish Allergy Rash    Patient Care Team: Jerrol Banana., MD as PCP - General (Family Medicine) Robert Bellow, MD as Consulting Physician (General Surgery) Jerrol Banana., MD (Family Medicine)  Objective:   Vitals:  Filed Vitals:   12/28/15 1411  BP: 142/58  Pulse: 78  Temp: 97.6 F (36.4 C)  TempSrc: Oral  Resp: 18  Height: 5\' 2"  (1.575 m)  Weight: 151 lb (68.493 kg)    Physical Exam  Constitutional: She is oriented to person, place, and time. She appears well-developed and well-nourished.  HENT:  Head: Normocephalic and atraumatic.  Right Ear: External ear normal.  Left Ear: External ear normal.  Nose: Nose normal.  Mouth/Throat: Oropharynx is clear and moist.  Eyes: Conjunctivae and EOM are normal. Pupils are equal, round, and reactive to light.  Neck: Normal range of motion. Neck supple.  Cardiovascular: Normal rate, regular rhythm, normal heart sounds and intact distal pulses.   Pulmonary/Chest: Effort normal and breath sounds normal.  Abdominal: Soft. Bowel sounds are normal.  Musculoskeletal: Normal range of motion.  Neurological: She is alert and oriented to person, place, and time. She has normal reflexes.  Skin: Skin is warm and dry.  Psychiatric: She has a normal mood and affect. Her behavior is normal. Judgment and thought content normal.    Activities of Daily Living In your present state of health, do you have any difficulty performing the following activities: 12/28/2015 12/28/2015  Hearing? N N  Vision? N N  Difficulty concentrating or making decisions? N N  Walking or climbing stairs? Y N  Dressing or bathing? N N  Doing  errands, shopping? N N    Fall Risk Assessment Fall Risk  12/28/2015  Falls in the past year? No     Depression Screen PHQ 2/9 Scores 12/28/2015  PHQ - 2 Score 0    Cognitive Testing - 6-CIT    Year: 0 points  Month: 0 points  Memorize "Pia Mau, [address], [place]"  Time (within 1 hour:) 0 points  Count backwards from 20: 0 points  Name months of year: 0 points  Repeat Address: 2 points   Total Score: 2/28  Interpretation : Normal (0-7) Abnormal (8-28)    Assessment & Plan:     Annual Wellness Visit  Reviewed patient's Family Medical History Reviewed and updated list of patient's medical providers Assessment of cognitive impairment was done Assessed patient's functional ability Established a written schedule for health screening [place]  Completed and Reviewed  Exercise Activities and Dietary recommendations Goals    None      Immunization History  Administered Date(s) Administered  . Influenza, High Dose Seasonal PF 08/10/2015  . Pneumococcal Conjugate-13 08/24/2014  . Td 07/23/2006    Health Maintenance  Topic Date Due  . ZOSTAVAX  07/19/1986  . PNA vac Low Risk Adult (2 of 2 - PPSV23) 08/25/2015  . INFLUENZA VACCINE  05/15/2016  . TETANUS/TDAP  07/23/2016  . DEXA SCAN  Completed      Discussed health benefits of physical activity, and encouraged her to engage in regular exercise appropriate for her age and condition.  Chronic bronchitis Treat with doxycycline for one week. No chest x-ray today Cough Breast cancer Followed by Dr. Tollie Pizza Major depressive disorder In remission with good control of her depressive symptoms. Lifelong sertraline Allergic rhinitis Osteoarthritis Hypertension Good control. I have done the exam and reviewed the above chart and it is accurate to the best of my knowledge.   Miguel Aschoff MD [facility] 12/28/2015 2:19  PM  ------------------------------------------------------------------------------------------------------------

## 2015-12-29 LAB — CBC WITH DIFFERENTIAL/PLATELET
BASOS: 1 %
Basophils Absolute: 0.1 10*3/uL (ref 0.0–0.2)
EOS (ABSOLUTE): 0.3 10*3/uL (ref 0.0–0.4)
EOS: 4 %
Hematocrit: 39.2 % (ref 34.0–46.6)
Hemoglobin: 12.8 g/dL (ref 11.1–15.9)
LYMPHS ABS: 2.8 10*3/uL (ref 0.7–3.1)
Lymphs: 40 %
MCH: 26.4 pg — AB (ref 26.6–33.0)
MCHC: 32.7 g/dL (ref 31.5–35.7)
MCV: 81 fL (ref 79–97)
MONOS ABS: 0.7 10*3/uL (ref 0.1–0.9)
Monocytes: 10 %
NEUTROS ABS: 3.1 10*3/uL (ref 1.4–7.0)
NEUTROS PCT: 45 %
PLATELETS: 141 10*3/uL — AB (ref 150–379)
RBC: 4.84 x10E6/uL (ref 3.77–5.28)
RDW: 17.8 % — ABNORMAL HIGH (ref 12.3–15.4)
WBC: 6.9 10*3/uL (ref 3.4–10.8)

## 2015-12-29 LAB — COMPREHENSIVE METABOLIC PANEL
A/G RATIO: 1.9 (ref 1.2–2.2)
ALT: 17 IU/L (ref 0–32)
AST: 22 IU/L (ref 0–40)
Albumin: 4.6 g/dL (ref 3.5–4.7)
Alkaline Phosphatase: 91 IU/L (ref 39–117)
BILIRUBIN TOTAL: 0.4 mg/dL (ref 0.0–1.2)
BUN/Creatinine Ratio: 23 (ref 11–26)
BUN: 15 mg/dL (ref 8–27)
CALCIUM: 10.1 mg/dL (ref 8.7–10.3)
CO2: 27 mmol/L (ref 18–29)
Chloride: 96 mmol/L (ref 96–106)
Creatinine, Ser: 0.65 mg/dL (ref 0.57–1.00)
GFR, EST AFRICAN AMERICAN: 91 mL/min/{1.73_m2} (ref >59)
GFR, EST NON AFRICAN AMERICAN: 79 mL/min/{1.73_m2} (ref >59)
GLOBULIN, TOTAL: 2.4 g/dL (ref 1.5–4.5)
Glucose: 107 mg/dL — ABNORMAL HIGH (ref 65–99)
POTASSIUM: 4 mmol/L (ref 3.5–5.2)
SODIUM: 138 mmol/L (ref 134–144)
TOTAL PROTEIN: 7 g/dL (ref 6.0–8.5)

## 2015-12-29 LAB — TSH: TSH: 1.65 u[IU]/mL (ref 0.450–4.500)

## 2016-04-02 ENCOUNTER — Other Ambulatory Visit: Payer: Self-pay | Admitting: Family Medicine

## 2016-05-03 ENCOUNTER — Other Ambulatory Visit: Payer: Self-pay | Admitting: Family Medicine

## 2016-05-07 ENCOUNTER — Encounter: Payer: Self-pay | Admitting: *Deleted

## 2016-05-11 ENCOUNTER — Encounter: Payer: Self-pay | Admitting: General Surgery

## 2016-05-11 DIAGNOSIS — Z853 Personal history of malignant neoplasm of breast: Secondary | ICD-10-CM | POA: Diagnosis not present

## 2016-05-11 DIAGNOSIS — R922 Inconclusive mammogram: Secondary | ICD-10-CM | POA: Diagnosis not present

## 2016-05-14 ENCOUNTER — Telehealth: Payer: Self-pay | Admitting: *Deleted

## 2016-05-14 ENCOUNTER — Encounter: Payer: Self-pay | Admitting: General Surgery

## 2016-05-14 ENCOUNTER — Ambulatory Visit (INDEPENDENT_AMBULATORY_CARE_PROVIDER_SITE_OTHER): Payer: Medicare Other | Admitting: General Surgery

## 2016-05-14 VITALS — BP 140/82 | HR 72 | Resp 14 | Ht 60.0 in | Wt 151.0 lb

## 2016-05-14 DIAGNOSIS — Z853 Personal history of malignant neoplasm of breast: Secondary | ICD-10-CM

## 2016-05-14 NOTE — Telephone Encounter (Signed)
Patient has been scheduled for a Bone density on 05/15/16 at 11:00am at the [facility] breast center. Patient is aware of date and time and instructions.

## 2016-05-14 NOTE — Progress Notes (Signed)
Patient ID: Crystal Carpenter, female   DOB: 1923/11/25, 82 y.o.   MRN: [id number]  Chief Complaint  Patient presents with  . Other    right breast mammogram    HPI Crystal Carpenter is a 82 y.o. female who presents for a breast evaluation. The most recent right breast mammogram was done on 05/11/16 .  Patient does perform regular self breast checks and gets regular mammograms done.    HPI  Past Medical History:  Diagnosis Date  . Cancer [facility]) July 2014   T2,N2a, Modified radical mastectomy. 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 Hypertension   . Malignant neoplasm of breast (female), unspecified site July 2014   Her case was presented at the [facility] tumor board in July 2014. Recommendations were for chest wall/peripheral lymphatic radiation if metastatic disease was not identified, anti-estrogen therapy only if additional metastatic disease was detected on a PET CT. No plans for adjuvant chemotherapy if metastatic disease was identified was recommended.  . Sciatic pain 2015    Past Surgical History:  Procedure Laterality Date  . ABDOMINAL HYSTERECTOMY    . BREAST SURGERY Left 04-28-13   left mastectomy, SN biopsy  . EPIDURAL BLOCK INJECTION  2015   x3    Family History  Problem Relation Age of Onset  . Stroke Mother   . Heart disease Father   . Stroke Father   . Parkinson's disease Sister   . Parkinson's disease Brother     Social History Social History  Substance Use Topics  . Smoking status: Never Smoker  . Smokeless tobacco: Never Used  . Alcohol use No    Allergies  Allergen Reactions  . Tape Rash  . Sulfa Antibiotics   . Shellfish Allergy Rash    Current Outpatient Prescriptions  Medication Sig Dispense Refill  . acetaminophen (TYLENOL) 325 MG tablet Take 650 mg by mouth every 6 (six) hours as needed.    [name] b complex vitamins tablet Take 1 tablet by mouth daily.    . hydrochlorothiazide (HYDRODIURIL) 25 MG tablet TAKE 1 TABLET BY MOUTH  EVERY DAY. 90 tablet 3  . ibuprofen (ADVIL,MOTRIN) 100 MG tablet Take 100 mg by mouth every 6 (six) hours as needed for fever.    [name] letrozole (FEMARA) 2.5 MG tablet Take 1 tablet (2.5 mg total) by mouth daily. 90 tablet 3  . montelukast (SINGULAIR) 10 MG tablet Take 1 tablet (10 mg total) by mouth at bedtime. 30 tablet 0  . naproxen sodium (ALEVE) 220 MG tablet Take by mouth.    . potassium chloride (MICRO-K) 10 MEQ CR capsule TAKE ONE (1) CAPSULE EACH DAY 30 capsule 12  . Saline 0.2 % SOLN     . sertraline (ZOLOFT) 100 MG tablet TAKE ONE (1) TABLET EACH DAY 30 tablet 12  . sertraline (ZOLOFT) 50 MG tablet Take 1 tablet by mouth daily.     No current facility-administered medications for this visit.     Review of Systems Review of Systems  Constitutional: Negative.   Respiratory: Negative.   Cardiovascular: Negative.     Blood pressure 140/82, pulse 72, resp. rate 14, height 5' (1.524 m), weight 151 lb (68.5 kg).  Physical Exam Physical Exam  Constitutional: She is oriented to person, place, and time. She appears well-developed and well-nourished.  Eyes: Conjunctivae are normal. No scleral icterus.  Neck: Neck supple.  Cardiovascular: Normal rate, regular rhythm and normal heart sounds.   Pulmonary/Chest: Effort normal and breath sounds normal.  Right breast exhibits no inverted nipple, no mass, no nipple discharge, no skin change and no tenderness.    Left mastectomy site is clean and well healed.   Abdominal: Soft. Bowel sounds are normal. There is no tenderness.  Lymphadenopathy:    She has no cervical adenopathy.  Neurological: She is alert and oriented to person, place, and time.  Skin: Skin is warm and dry.    Data Reviewed Right breast screening mammogram dated 05/11/2016: BI-RADS-1.  Assessment    3 years status post T2 N2 carcinoma the left breast. Excellent tolerance of Femara.  No recent bone density. (Patient reports that she fell 3 times in the last month  when tripping over various and sundry objects without injury).    Plan    Patient will continue Femara at this time.   Patient to have a bone density test Patient to return in one year right screening mammogram.   This information has been scribed by Gaspar Cola CMA.   Robert Bellow 05/14/2016, 12:19 PM

## 2016-05-14 NOTE — Patient Instructions (Addendum)
Patient to return in one year right screening mammogram.   Patient to have a bone density test

## 2016-05-15 ENCOUNTER — Ambulatory Visit
Admission: RE | Admit: 2016-05-15 | Discharge: 2016-05-15 | Disposition: A | Discharge status: Home / Self Care | Payer: Medicare Other | Source: Ambulatory Visit | Attending: General Surgery | Admitting: General Surgery

## 2016-05-15 DIAGNOSIS — M810 Age-related osteoporosis without current pathological fracture: Secondary | ICD-10-CM | POA: Diagnosis not present

## 2016-05-15 DIAGNOSIS — Z780 Asymptomatic menopausal state: Secondary | ICD-10-CM | POA: Diagnosis not present

## 2016-05-15 DIAGNOSIS — Z853 Personal history of malignant neoplasm of breast: Secondary | ICD-10-CM | POA: Insufficient documentation

## 2016-05-28 DIAGNOSIS — H43813 Vitreous degeneration, bilateral: Secondary | ICD-10-CM | POA: Diagnosis not present

## 2016-05-28 DIAGNOSIS — H52223 Regular astigmatism, bilateral: Secondary | ICD-10-CM | POA: Diagnosis not present

## 2016-05-29 ENCOUNTER — Telehealth: Payer: Self-pay | Admitting: Family Medicine

## 2016-05-29 DIAGNOSIS — M810 Age-related osteoporosis without current pathological fracture: Secondary | ICD-10-CM

## 2016-05-29 MED ORDER — ALENDRONATE SODIUM 70 MG PO TABS: 70.0000 mg | ORAL_TABLET | ORAL | 11 refills | Status: DC

## 2016-05-29 NOTE — Telephone Encounter (Signed)
Osteoporotic, start alendronate 70 mg weekly. See [place] back in 1-2 months

## 2016-05-29 NOTE — Telephone Encounter (Signed)
Pt called regarding her bone density test.  She received the report from [facility].  They suggested that she call her primary to see if she needs to start taking medication.  They told her you should have a copy od the report.    Patients call back is [phone]  Thanks Con Memos

## 2016-05-29 NOTE — Telephone Encounter (Signed)
Results are in chart. Please advise

## 2016-05-29 NOTE — Telephone Encounter (Signed)
Advised patient of results. Medication has been sent into the pharmacy. Patient has an appt scheduled on 06/28/2016.

## 2016-06-04 ENCOUNTER — Other Ambulatory Visit: Payer: Self-pay | Admitting: Family Medicine

## 2016-06-28 ENCOUNTER — Ambulatory Visit: Payer: Medicare Other | Admitting: Family Medicine

## 2016-07-03 ENCOUNTER — Ambulatory Visit (INDEPENDENT_AMBULATORY_CARE_PROVIDER_SITE_OTHER): Payer: Medicare Other | Admitting: Family Medicine

## 2016-07-03 VITALS — BP 128/74 | HR 72 | Temp 97.9°F | Resp 16 | Wt 154.0 lb

## 2016-07-03 DIAGNOSIS — F329 Major depressive disorder, single episode, unspecified: Secondary | ICD-10-CM | POA: Diagnosis not present

## 2016-07-03 DIAGNOSIS — K5909 Other constipation: Secondary | ICD-10-CM | POA: Diagnosis not present

## 2016-07-03 DIAGNOSIS — F32A Depression, unspecified: Secondary | ICD-10-CM

## 2016-07-03 DIAGNOSIS — I10 Essential (primary) hypertension: Secondary | ICD-10-CM

## 2016-07-03 DIAGNOSIS — K219 Gastro-esophageal reflux disease without esophagitis: Secondary | ICD-10-CM

## 2016-07-03 DIAGNOSIS — E7800 Pure hypercholesterolemia, unspecified: Secondary | ICD-10-CM | POA: Diagnosis not present

## 2016-07-03 DIAGNOSIS — Z23 Encounter for immunization: Secondary | ICD-10-CM

## 2016-07-03 DIAGNOSIS — M810 Age-related osteoporosis without current pathological fracture: Secondary | ICD-10-CM | POA: Diagnosis not present

## 2016-07-03 NOTE — Progress Notes (Signed)
Crystal Carpenter  MRN: [id number] DOB: Aug 04, 1924  Subjective:  HPI  Patient is here to follow up after starting to take Fosamax once a week due to been diagnosed with osteoporosis.  She also wanted to discuss if it safe for her to continue taking Stool softeners daily which helps her a lot with a regular bowel movement.  Her last labs were done on 12/28/15 and platelets were low she wanted to know what can she do to help with that. Patient Active Problem List   Diagnosis Date Noted  . Allergic rhinitis 08/25/2015  . Arthropathia 08/25/2015  . Clinical depression 08/25/2015  . Urinary system disease 08/25/2015  . Calcium blood increased 08/25/2015  . Lumbar canal stenosis 06/08/2014  . Personal history of malignant neoplasm of breast 06/30/2013  . Malignant neoplasm of breast (female) ([place]) 05/25/2013  . Cancer ([place])   . Breast cancer ([place]) 04/09/2013  . Acid reflux 02/24/2010  . Hypercholesterolemia 02/24/2010  . Avitaminosis D 02/28/2009  . BP (high blood pressure) 07/01/2007    Past Medical History:  Diagnosis Date  . Cancer [facility]) July 2014   T2,N2a, Modified radical mastectomy. 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 Hypertension   . Malignant neoplasm of breast (female), unspecified site July 2014   Her case was presented at the [facility] tumor board in July 2014. Recommendations were for chest wall/peripheral lymphatic radiation if metastatic disease was not identified, anti-estrogen therapy only if additional metastatic disease was detected on a PET CT. No plans for adjuvant chemotherapy if metastatic disease was identified was recommended.  . Sciatic pain 2015    Social History   Social History  . Marital status: Married    Spouse name: N/A  . Number of children: N/A  . Years of education: N/A   Occupational History  . Not on file.   Social History Main Topics  . Smoking status: Never Smoker  . Smokeless tobacco: Never Used  . Alcohol use  No  . Drug use: No  . Sexual activity: Not on file   Other Topics Concern  . Not on file   Social History Narrative  . No narrative on file    Outpatient Encounter Prescriptions as of 07/03/2016  Medication Sig Note  . acetaminophen (TYLENOL) 325 MG tablet Take 650 mg by mouth every 6 (six) hours as needed.   [name] alendronate (FOSAMAX) 70 MG tablet Take 1 tablet (70 mg total) by mouth every 7 (seven) days. Take with a full glass of water on an empty stomach.   [name] b complex vitamins tablet Take 1 tablet by mouth daily.   . famotidine (PEPCID) 10 MG tablet Take 10 mg by mouth daily as needed for heartburn or indigestion.   . hydrochlorothiazide (HYDRODIURIL) 25 MG tablet TAKE 1 TABLET BY MOUTH EVERY DAY.   [name] ibuprofen (ADVIL,MOTRIN) 100 MG tablet Take 100 mg by mouth every 6 (six) hours as needed for fever.   [name] letrozole (FEMARA) 2.5 MG tablet Take 1 tablet (2.5 mg total) by mouth daily.   . montelukast (SINGULAIR) 10 MG tablet Take 1 tablet (10 mg total) by mouth at bedtime.   . naproxen sodium (ALEVE) 220 MG tablet Take by mouth. 08/25/2015: Medication taken as needed.  Received from: [facility]  . potassium chloride (MICRO-K) 10 MEQ CR capsule TAKE 1 CAPSULE BY MOUTH EVERY DAY.   . Saline 0.2 % SOLN  08/25/2015: Received from: [facility]  . sertraline (ZOLOFT) 100  MG tablet TAKE ONE (1) TABLET EACH DAY   . sertraline (ZOLOFT) 50 MG tablet Take 1 tablet by mouth daily.    No facility-administered encounter medications on file as of 07/03/2016.     Allergies  Allergen Reactions  . Tape Rash  . Sulfa Antibiotics   . Shellfish Allergy Rash    Review of Systems  Constitutional: Positive for malaise/fatigue (give out more as she is getting older.).  Eyes: Negative.   Respiratory: Positive for shortness of breath (chronic-stable.).   Cardiovascular: Negative.   Gastrointestinal: Positive for constipation (better).  Musculoskeletal: Positive for  back pain and joint pain.  Neurological: Positive for weakness. Negative for dizziness, tingling and tremors.  Endo/Heme/Allergies: Bruises/bleeds easily.  Psychiatric/Behavioral: Negative.    Objective:  BP 128/74   Pulse 72   Temp 97.9 F (36.6 C)   Resp 16   Wt 154 lb (69.9 kg)   BMI 30.08 kg/m   Physical Exam  Constitutional: She is oriented to person, place, and time and well-developed, well-nourished, and in no distress.  HENT:  Head: Normocephalic and atraumatic.  Right Ear: External ear normal.  Left Ear: External ear normal.  Nose: Nose normal.  Eyes: Conjunctivae are normal. Pupils are equal, round, and reactive to light.  Neck: Normal range of motion. Neck supple.  Cardiovascular: Normal rate, regular rhythm, normal heart sounds and intact distal pulses.   No murmur heard. Pulmonary/Chest: Effort normal and breath sounds normal. No respiratory distress. She has no wheezes.  Abdominal: Soft.  Musculoskeletal: She exhibits no edema or tenderness.  Neurological: She is alert and oriented to person, place, and time. No cranial nerve deficit. Coordination normal.  Skin: Skin is warm and dry.  Psychiatric: Mood, memory, affect and judgment normal.    Assessment and Plan :  1. Osteoporosis Patient is doing well on Fosamax. Follow.  2. Essential hypertension Stable.  3. Gastroesophageal reflux disease, esophagitis presence not specified  4. Other constipation Advised patient taking stool softener daily is safe at this time. Advised patient that platelets were slightly low on her last visit and that we will follow and nothing that needs to be done at this time. 5. Breast cancer Per Dr. Bary Castilla Check labs today.  HPI, Exam and A&P transcribed under direction and in the presence of Miguel Aschoff, MD.

## 2016-07-06 DIAGNOSIS — I10 Essential (primary) hypertension: Secondary | ICD-10-CM | POA: Diagnosis not present

## 2016-07-06 DIAGNOSIS — E7800 Pure hypercholesterolemia, unspecified: Secondary | ICD-10-CM | POA: Diagnosis not present

## 2016-07-06 DIAGNOSIS — K219 Gastro-esophageal reflux disease without esophagitis: Secondary | ICD-10-CM | POA: Diagnosis not present

## 2016-07-07 LAB — CBC WITH DIFFERENTIAL/PLATELET
BASOS: 0 %
Basophils Absolute: 0 10*3/uL (ref 0.0–0.2)
EOS (ABSOLUTE): 0.2 10*3/uL (ref 0.0–0.4)
EOS: 3 %
HEMATOCRIT: 38.1 % (ref 34.0–46.6)
HEMOGLOBIN: 12.4 g/dL (ref 11.1–15.9)
Immature Grans (Abs): 0 10*3/uL (ref 0.0–0.1)
Immature Granulocytes: 0 %
LYMPHS ABS: 2.5 10*3/uL (ref 0.7–3.1)
Lymphs: 44 %
MCH: 25.6 pg — AB (ref 26.6–33.0)
MCHC: 32.5 g/dL (ref 31.5–35.7)
MCV: 79 fL (ref 79–97)
MONOCYTES: 8 %
Monocytes Absolute: 0.5 10*3/uL (ref 0.1–0.9)
NEUTROS ABS: 2.6 10*3/uL (ref 1.4–7.0)
Neutrophils: 45 %
Platelets: 74 10*3/uL — CL (ref 150–379)
RBC: 4.85 x10E6/uL (ref 3.77–5.28)
RDW: 18.8 % — ABNORMAL HIGH (ref 12.3–15.4)
WBC: 5.7 10*3/uL (ref 3.4–10.8)

## 2016-07-07 LAB — COMPREHENSIVE METABOLIC PANEL
A/G RATIO: 2 (ref 1.2–2.2)
ALBUMIN: 4.5 g/dL (ref 3.5–4.7)
ALT: 17 IU/L (ref 0–32)
AST: 22 IU/L (ref 0–40)
Alkaline Phosphatase: 87 IU/L (ref 39–117)
BILIRUBIN TOTAL: 0.6 mg/dL (ref 0.0–1.2)
BUN / CREAT RATIO: 29 — AB (ref 12–28)
BUN: 19 mg/dL (ref 8–27)
CHLORIDE: 95 mmol/L — AB (ref 96–106)
CO2: 26 mmol/L (ref 18–29)
Calcium: 9.6 mg/dL (ref 8.7–10.3)
Creatinine, Ser: 0.65 mg/dL (ref 0.57–1.00)
GFR calc non Af Amer: 79 mL/min/{1.73_m2} (ref >59)
GFR, EST AFRICAN AMERICAN: 91 mL/min/{1.73_m2} (ref >59)
GLOBULIN, TOTAL: 2.2 g/dL (ref 1.5–4.5)
GLUCOSE: 110 mg/dL — AB (ref 65–99)
Potassium: 3.7 mmol/L (ref 3.5–5.2)
Sodium: 138 mmol/L (ref 134–144)
TOTAL PROTEIN: 6.7 g/dL (ref 6.0–8.5)

## 2016-07-07 LAB — LIPID PANEL WITH LDL/HDL RATIO
Cholesterol, Total: 182 mg/dL (ref 100–199)
HDL: 44 mg/dL (ref >39)
LDL Calculated: 119 mg/dL — ABNORMAL HIGH (ref 0–99)
LDL/HDL RATIO: 2.7 ratio (ref 0.0–3.2)
Triglycerides: 95 mg/dL (ref 0–149)
VLDL Cholesterol Cal: 19 mg/dL (ref 5–40)

## 2016-07-07 LAB — TSH: TSH: 1.32 u[IU]/mL (ref 0.450–4.500)

## 2016-07-10 ENCOUNTER — Telehealth: Payer: Self-pay | Admitting: Family Medicine

## 2016-07-10 NOTE — Telephone Encounter (Signed)
Pt would like to pick up a copy of her lab results that she had done last Friday.  She would like to pick them up tomorrow.  Her call back is  [phone]  [facility]

## 2016-07-10 NOTE — Telephone Encounter (Signed)
Labs printed and placed up front.

## 2016-07-11 DIAGNOSIS — H903 Sensorineural hearing loss, bilateral: Secondary | ICD-10-CM | POA: Diagnosis not present

## 2016-07-30 ENCOUNTER — Telehealth: Payer: Self-pay | Admitting: Family Medicine

## 2016-07-30 MED ORDER — TRAMADOL HCL 50 MG PO TABS: 50.0000 mg | ORAL_TABLET | ORAL | 0 refills | Status: DC | PRN

## 2016-07-30 NOTE — Telephone Encounter (Signed)
Please review-aa 

## 2016-07-30 NOTE — Telephone Encounter (Signed)
Tramadol 50mg  q 4 hrs prn,#55

## 2016-07-30 NOTE — Telephone Encounter (Signed)
Pt states her brother past away and pt is requesting something to help with pain in her back and legs so she will be able to get around to go to the funeral tommorrow.  [facility].  CB#[phone]/MW

## 2016-07-30 NOTE — Telephone Encounter (Signed)
RX called in and pt advised-aa 

## 2016-08-06 ENCOUNTER — Telehealth: Payer: Self-pay | Admitting: Family Medicine

## 2016-08-06 MED ORDER — GABAPENTIN 100 MG PO CAPS: 100.0000 mg | ORAL_CAPSULE | Freq: Three times a day (TID) | ORAL | 5 refills | Status: DC

## 2016-08-06 NOTE — Telephone Encounter (Signed)
Pt stated that she had trouble with her sciatic nerve in 2015 and she thinks that is what is going on now. Pt stated that she can't get an appt until 08/14/16 with ortho and wanted to see if Dr. Rosanna Randy would call in Gabapentin to [place] b/c that has helped her friend that has the same trouble and she wanted to try it to get relief from the pain since she can't see her orthopedic until 08/14/16. Please advise. Thanks TNP

## 2016-08-06 NOTE — Telephone Encounter (Signed)
RX sent in and pt advised-aa 

## 2016-08-06 NOTE — Telephone Encounter (Signed)
Please review-aa 

## 2016-08-06 NOTE — Telephone Encounter (Signed)
100mg  TID--start with 1 hs and increase by 1 pill daily.

## 2016-08-14 DIAGNOSIS — M5432 Sciatica, left side: Secondary | ICD-10-CM | POA: Diagnosis not present

## 2016-08-14 DIAGNOSIS — M545 Low back pain: Secondary | ICD-10-CM | POA: Diagnosis not present

## 2016-08-21 DIAGNOSIS — M5136 Other intervertebral disc degeneration, lumbar region: Secondary | ICD-10-CM | POA: Diagnosis not present

## 2016-08-21 DIAGNOSIS — M5416 Radiculopathy, lumbar region: Secondary | ICD-10-CM | POA: Diagnosis not present

## 2016-08-21 DIAGNOSIS — M48062 Spinal stenosis, lumbar region with neurogenic claudication: Secondary | ICD-10-CM | POA: Diagnosis not present

## 2016-08-31 ENCOUNTER — Ambulatory Visit: Payer: Medicare Other

## 2016-09-13 ENCOUNTER — Telehealth: Payer: Self-pay | Admitting: Family Medicine

## 2016-09-13 NOTE — Telephone Encounter (Signed)
Spoke with patient on the phone and advised her of report from 07/07/16, patient has her two month follow up appt scheduled for 09/18/16, would you like to place CBC order now or wait till appt to order? KW

## 2016-09-13 NOTE — Telephone Encounter (Signed)
Pt stated she had her labs done is September 2017 and when she was advised of the results she was told they needed to be recheck in 1 to 2 months. Pt wanted to see if she just needed a lab slip to have the labs done or if she needed to schedule an OV. Please advise. Thanks TNP

## 2016-09-15 NOTE — Telephone Encounter (Signed)
12/5 will be fine for labs and OV together

## 2016-09-17 NOTE — Telephone Encounter (Signed)
Pt did not have appt for 09/18/16. Im not sure where the mix up happened but I have mad her an appt for 09/20/16 at 1:30.

## 2016-09-20 ENCOUNTER — Ambulatory Visit (INDEPENDENT_AMBULATORY_CARE_PROVIDER_SITE_OTHER): Payer: Medicare Other | Admitting: Family Medicine

## 2016-09-20 VITALS — BP 126/62 | HR 80 | Temp 97.8°F | Resp 16 | Wt 150.0 lb

## 2016-09-20 DIAGNOSIS — D691 Qualitative platelet defects: Secondary | ICD-10-CM

## 2016-09-20 DIAGNOSIS — R0602 Shortness of breath: Secondary | ICD-10-CM

## 2016-09-20 DIAGNOSIS — I10 Essential (primary) hypertension: Secondary | ICD-10-CM

## 2016-09-20 DIAGNOSIS — F3289 Other specified depressive episodes: Secondary | ICD-10-CM | POA: Diagnosis not present

## 2016-09-20 DIAGNOSIS — M5416 Radiculopathy, lumbar region: Secondary | ICD-10-CM

## 2016-09-20 NOTE — Patient Instructions (Signed)
Increase Zoloft (sertraline) to 1 whole tablet instead of 1/2 tablet daily.

## 2016-09-20 NOTE — Progress Notes (Signed)
Crystal Carpenter  MRN: [id number] DOB: 02/11/1924  Subjective:  HPI  Patient is here for follow up from September and a few issues she would like to address. Last labs were done in September and platelets were stable per that note and need to follow up today on the levels. Patient has noticed her energy has decreased, more short winded/gives out fast, some dizziness, gets drowsy in the afternoon and sometimes has to take a nap. She is feeling more depressed and is not sure if its because of the holidays or/and the fact she has not been out of the house to go anywhere except for 1 time to church in at least a month. She is on Zoloft right now and is not sure if it is half tablet of 50 mg or 100 mg. Not sure how much this medication helped overall. She wanted to mention a spell she had this Monday 09/17/16. She did not eat until 3 pm that day and when she did she had BLT and sweet tea. After that she went to grocery store and noticed her self feeling confused, feeling like something is not right for about 10 minutes. She denies having any chest pain or tightness or numbness at that time. She thinks maybe its because she did not eat all day. She has not had anymore issues with this since then.  She is having a hard time with sciatica nerve pain since since October. She has seen Dr Boneta Lucks for this and [facility] and has an appointment today for an injection. We called her in Tramadol in October for pain so she could attend her brother's funeral and she takes it as needed. She tried to take Gabapentin that we sent in early in November but after taking overall 9 tablets of this medication she developed a headache and just did not feel right so she stopped. Depression screen [facility] 2/9 09/20/2016 12/28/2015  Decreased Interest 1 0  Down, Depressed, Hopeless 1 0  PHQ - 2 Score 2 0  Altered sleeping 0 -  Tired, decreased energy 3 -  Change in appetite 0 -  Feeling bad or failure about yourself  0 -  Trouble  concentrating 0 -  Moving slowly or fidgety/restless 0 -  Suicidal thoughts 0 -  PHQ-9 Score 5 -  Difficult doing work/chores Very difficult -    Patient Active Problem List   Diagnosis Date Noted  . Allergic rhinitis 08/25/2015  . Arthropathia 08/25/2015  . Clinical depression 08/25/2015  . Urinary system disease 08/25/2015  . Calcium blood increased 08/25/2015  . Lumbar canal stenosis 06/08/2014  . Personal history of malignant neoplasm of breast 06/30/2013  . Malignant neoplasm of breast (female) ([place]) 05/25/2013  . Cancer ([place])   . Breast cancer ([place]) 04/09/2013  . Acid reflux 02/24/2010  . Hypercholesterolemia 02/24/2010  . Avitaminosis D 02/28/2009  . BP (high blood pressure) 07/01/2007    Past Medical History:  Diagnosis Date  . Cancer [facility]) July 2014   T2,N2a, Modified radical mastectomy. 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 Hypertension   . Malignant neoplasm of breast (female), unspecified site [facility]) July 2014   Her case was presented at the [facility] tumor board in July 2014. Recommendations were for chest wall/peripheral lymphatic radiation if metastatic disease was not identified, anti-estrogen therapy only if additional metastatic disease was detected on a PET CT. No plans for adjuvant chemotherapy if metastatic disease was identified was recommended.  . Sciatic pain 2015  Social History   Social History  . Marital status: Married    Spouse name: N/A  . Number of children: N/A  . Years of education: N/A   Occupational History  . Not on file.   Social History Main Topics  . Smoking status: Never Smoker  . Smokeless tobacco: Never Used  . Alcohol use No  . Drug use: No  . Sexual activity: Not on file   Other Topics Concern  . Not on file   Social History Narrative  . No narrative on file    Outpatient Encounter Prescriptions as of 09/20/2016  Medication Sig Note  . acetaminophen (TYLENOL) 325 MG tablet Take 650 mg by mouth  every 6 (six) hours as needed.   [name] alendronate (FOSAMAX) 70 MG tablet Take 1 tablet (70 mg total) by mouth every 7 (seven) days. Take with a full glass of water on an empty stomach.   [name] b complex vitamins tablet Take 1 tablet by mouth daily.   . famotidine (PEPCID) 10 MG tablet Take 10 mg by mouth daily as needed for heartburn or indigestion.   . hydrochlorothiazide (HYDRODIURIL) 25 MG tablet TAKE 1 TABLET BY MOUTH EVERY DAY.   [name] ibuprofen (ADVIL,MOTRIN) 100 MG tablet Take 100 mg by mouth every 6 (six) hours as needed for fever.   [name] letrozole (FEMARA) 2.5 MG tablet Take 1 tablet (2.5 mg total) by mouth daily.   . montelukast (SINGULAIR) 10 MG tablet Take 1 tablet (10 mg total) by mouth at bedtime.   . naproxen sodium (ALEVE) 220 MG tablet Take by mouth. 08/25/2015: Medication taken as needed.  Received from: [facility]  . potassium chloride (MICRO-K) 10 MEQ CR capsule TAKE 1 CAPSULE BY MOUTH EVERY DAY.   . Saline 0.2 % SOLN  08/25/2015: Received from: [facility]  . sertraline (ZOLOFT) 100 MG tablet TAKE ONE (1) TABLET EACH DAY   . sertraline (ZOLOFT) 50 MG tablet Take 1 tablet by mouth daily.   . traMADol (ULTRAM) 50 MG tablet Take 1 tablet (50 mg total) by mouth every 4 (four) hours as needed.   . [DISCONTINUED] gabapentin (NEURONTIN) 100 MG capsule Take 1 capsule (100 mg total) by mouth 3 (three) times daily.    No facility-administered encounter medications on file as of 09/20/2016.     Allergies  Allergen Reactions  . Tape Rash  . Gabapentin     Headache, "felt weird in the head"  . Sulfa Antibiotics   . Shellfish Allergy Rash    Review of Systems  Constitutional: Positive for malaise/fatigue.  Respiratory: Positive for shortness of breath.   Cardiovascular: Negative.   Gastrointestinal: Negative.   Musculoskeletal: Positive for back pain, joint pain and myalgias.       Unsteady gait.  Neurological: Positive for dizziness and weakness.        Numbness in great big toe at night usually  Psychiatric/Behavioral: Positive for depression.    Objective:  BP 126/62   Pulse 80   Temp 97.8 F (36.6 C)   Resp 16   Wt 150 lb (68 kg)   BMI 29.29 kg/m   Physical Exam  Constitutional: She is oriented to person, place, and time and well-developed, well-nourished, and in no distress.  HENT:  Head: Normocephalic and atraumatic.  Right Ear: External ear normal.  Left Ear: External ear normal.  Nose: Nose normal.  Eyes: Conjunctivae are normal. Pupils are equal, round, and reactive to light.  Neck: Normal range of motion.  Neck supple. No thyromegaly present.  Cardiovascular: Normal rate, regular rhythm, normal heart sounds and intact distal pulses.   No murmur heard. Pulmonary/Chest: Effort normal and breath sounds normal. No respiratory distress. She has no wheezes.  Abdominal: Soft.  Musculoskeletal:  Using a cane  Neurological: She is alert and oriented to person, place, and time.  Skin: Skin is warm and dry.  Psychiatric: Mood, memory, affect and judgment normal.    Assessment and Plan :  1. Shortness of breath EKG stable. Do not think this is cardiac at this time. Follow. - EKG 12-Lead  2. Essential hypertension Stable. - TSH  3. Other depression Worsening. PHQ 9 score today is 5. Advised patient to take Zoloft whole tablet instead of 1/2 tablet daily. Advised patient to bring in her Zoloft bottle so we can verify the dose. Re check on the next visit. - TSH  4. Lumbar radiculitis I think some of the symptoms are present due to this issue been uncontrolled. Patient is seen Dr Sharlet Salina and Dr Rogers Blocker for this.  5. Abnormal platelets (HCC) Re check levels today. - CBC w/Diff/Platelet 6.Breast cancer   HPI, Exam and A&P transcribed under direction and in the presence of Miguel Aschoff, MD. I have done the exam and reviewed the chart and it is accurate to the best of my knowledge. Development worker, community has been used and   any errors in dictation or transcription are unintentional. Miguel Aschoff M.D. [facility]

## 2016-09-21 DIAGNOSIS — M5416 Radiculopathy, lumbar region: Secondary | ICD-10-CM | POA: Diagnosis not present

## 2016-09-21 DIAGNOSIS — M48062 Spinal stenosis, lumbar region with neurogenic claudication: Secondary | ICD-10-CM | POA: Diagnosis not present

## 2016-09-21 DIAGNOSIS — M5136 Other intervertebral disc degeneration, lumbar region: Secondary | ICD-10-CM | POA: Diagnosis not present

## 2016-09-21 LAB — CBC WITH DIFFERENTIAL/PLATELET
BASOS ABS: 0 10*3/uL (ref 0.0–0.2)
Basos: 0 %
EOS (ABSOLUTE): 0.3 10*3/uL (ref 0.0–0.4)
Eos: 5 %
Hematocrit: 38.1 % (ref 34.0–46.6)
Hemoglobin: 12 g/dL (ref 11.1–15.9)
Immature Grans (Abs): 0 10*3/uL (ref 0.0–0.1)
Immature Granulocytes: 1 %
LYMPHS ABS: 3.1 10*3/uL (ref 0.7–3.1)
Lymphs: 42 %
MCH: 24.4 pg — AB (ref 26.6–33.0)
MCHC: 31.5 g/dL (ref 31.5–35.7)
MCV: 77 fL — ABNORMAL LOW (ref 79–97)
MONOCYTES: 6 %
MONOS ABS: 0.5 10*3/uL (ref 0.1–0.9)
Neutrophils Absolute: 3.4 10*3/uL (ref 1.4–7.0)
Neutrophils: 46 %
PLATELETS: 106 10*3/uL — AB (ref 150–379)
RBC: 4.92 x10E6/uL (ref 3.77–5.28)
RDW: 19.4 % — AB (ref 12.3–15.4)
WBC: 7.4 10*3/uL (ref 3.4–10.8)

## 2016-09-21 LAB — TSH: TSH: 1.82 u[IU]/mL (ref 0.450–4.500)

## 2016-09-21 NOTE — Progress Notes (Signed)
Patient advised.

## 2016-10-19 DIAGNOSIS — M5432 Sciatica, left side: Secondary | ICD-10-CM | POA: Diagnosis not present

## 2016-10-19 DIAGNOSIS — M25819 Other specified joint disorders, unspecified shoulder: Secondary | ICD-10-CM | POA: Diagnosis not present

## 2016-11-13 DIAGNOSIS — D696 Thrombocytopenia, unspecified: Secondary | ICD-10-CM | POA: Diagnosis not present

## 2016-11-14 DIAGNOSIS — M5136 Other intervertebral disc degeneration, lumbar region: Secondary | ICD-10-CM | POA: Diagnosis not present

## 2016-11-14 DIAGNOSIS — M5416 Radiculopathy, lumbar region: Secondary | ICD-10-CM | POA: Diagnosis not present

## 2016-11-14 DIAGNOSIS — M48062 Spinal stenosis, lumbar region with neurogenic claudication: Secondary | ICD-10-CM | POA: Diagnosis not present

## 2016-12-10 ENCOUNTER — Other Ambulatory Visit: Payer: Self-pay | Admitting: Family Medicine

## 2017-01-02 ENCOUNTER — Ambulatory Visit: Payer: Medicare Other

## 2017-01-02 ENCOUNTER — Ambulatory Visit: Payer: Medicare Other | Admitting: Family Medicine

## 2017-01-10 ENCOUNTER — Ambulatory Visit: Payer: Medicare Other | Admitting: Family Medicine

## 2017-01-15 ENCOUNTER — Telehealth: Payer: Self-pay | Admitting: Family Medicine

## 2017-01-15 NOTE — Telephone Encounter (Signed)
Called Pt to re-schedule AWV with [facility] - knb ° °

## 2017-01-23 ENCOUNTER — Encounter: Payer: Self-pay | Admitting: Family Medicine

## 2017-01-23 ENCOUNTER — Ambulatory Visit (INDEPENDENT_AMBULATORY_CARE_PROVIDER_SITE_OTHER): Payer: Medicare Other | Admitting: Family Medicine

## 2017-01-23 ENCOUNTER — Ambulatory Visit: Payer: Medicare Other

## 2017-01-23 VITALS — BP 142/80 | HR 74 | Temp 97.6°F | Resp 16 | Wt 152.0 lb

## 2017-01-23 DIAGNOSIS — D473 Essential (hemorrhagic) thrombocythemia: Secondary | ICD-10-CM | POA: Diagnosis not present

## 2017-01-23 DIAGNOSIS — F3289 Other specified depressive episodes: Secondary | ICD-10-CM | POA: Diagnosis not present

## 2017-01-23 DIAGNOSIS — I10 Essential (primary) hypertension: Secondary | ICD-10-CM

## 2017-01-23 DIAGNOSIS — Z170 Estrogen receptor positive status [ER+]: Secondary | ICD-10-CM | POA: Diagnosis not present

## 2017-01-23 DIAGNOSIS — R7989 Other specified abnormal findings of blood chemistry: Secondary | ICD-10-CM | POA: Diagnosis not present

## 2017-01-23 DIAGNOSIS — C50912 Malignant neoplasm of unspecified site of left female breast: Secondary | ICD-10-CM

## 2017-01-23 NOTE — Progress Notes (Signed)
Subjective:  HPI  Hypertension, follow-up:  BP Readings from Last 3 Encounters:  01/23/17 (!) 142/80  09/20/16 126/62  07/03/16 128/74    She was last seen for hypertension 6 months ago.  BP at that visit was 128/74. Management since that visit includes none. She reports good compliance with treatment. She is not having side effects.  She is not exercising. She is adherent to low salt diet.   Outside blood pressures are not being checked. She is experiencing dyspnea. This has been addressed in the past and everything was ok. Patient denies chest pain, chest pressure/discomfort, claudication, exertional chest pressure/discomfort, fatigue, irregular heart beat, lower extremity edema, near-syncope, orthopnea, palpitations, paroxysmal nocturnal dyspnea, syncope and tachypnea.   Cardiovascular risk factors include advanced age (older than 27 for men, 30 for women) and hypertension.   Wt Readings from Last 3 Encounters:  01/23/17 152 lb (68.9 kg)  09/20/16 150 lb (68 kg)  07/03/16 154 lb (69.9 kg)   ------------------------------------------------------------------------  Depression- last OV on 09/20/16 depression was worse. She was suppose to increase Zoloft to a whole pill but she did not feel like she needed it, so she did not increase it. She reports that she is feeling well emotionally.   Per labs on 09/20/16 CBC and TSH were checked noted to repeat CBC and iron on next OV.   She also wanted to know if she needed her potassium, because it has gone up to 22$ a month.  Prior to Admission medications   Medication Sig Start Date End Date Taking? Authorizing Provider  acetaminophen (TYLENOL) 325 MG tablet Take 650 mg by mouth every 6 (six) hours as needed.    Historical Provider, MD  alendronate (FOSAMAX) 70 MG tablet Take 1 tablet (70 mg total) by mouth every 7 (seven) days. Take with a full glass of water on an empty stomach. 05/29/16   Jerrol Banana., MD  b complex  vitamins tablet Take 1 tablet by mouth daily.    Historical Provider, MD  famotidine (PEPCID) 10 MG tablet Take 10 mg by mouth daily as needed for heartburn or indigestion.    Historical Provider, MD  hydrochlorothiazide (HYDRODIURIL) 25 MG tablet TAKE 1 TABLET BY MOUTH EVERY DAY. 05/03/16   Jerrol Banana., MD  ibuprofen (ADVIL,MOTRIN) 100 MG tablet Take 100 mg by mouth every 6 (six) hours as needed for fever.    Historical Provider, MD  letrozole (FEMARA) 2.5 MG tablet Take 1 tablet (2.5 mg total) by mouth daily. 05/11/15   Robert Bellow, MD  montelukast (SINGULAIR) 10 MG tablet Take 1 tablet (10 mg total) by mouth at bedtime. 12/21/15   Birdie Sons, MD  naproxen sodium (ALEVE) 220 MG tablet Take by mouth.    Historical Provider, MD  potassium chloride (MICRO-K) 10 MEQ CR capsule TAKE 1 CAPSULE BY MOUTH EVERY DAY. 12/10/16   Jerrol Banana., MD  Saline 0.2 % SOLN     Historical Provider, MD  sertraline (ZOLOFT) 100 MG tablet TAKE ONE (1) TABLET EACH DAY 04/02/16   Ninah Moccio Maceo Pro., MD  sertraline (ZOLOFT) 50 MG tablet Take 1 tablet by mouth daily. 03/23/13   Historical Provider, MD  traMADol (ULTRAM) 50 MG tablet Take 1 tablet (50 mg total) by mouth every 4 (four) hours as needed. 07/30/16   Abdou Stocks Maceo Pro., MD    Patient Active Problem List   Diagnosis Date Noted  . Allergic rhinitis 08/25/2015  . Arthropathia  08/25/2015  . Clinical depression 08/25/2015  . Urinary system disease 08/25/2015  . Calcium blood increased 08/25/2015  . Lumbar canal stenosis 06/08/2014  . Personal history of malignant neoplasm of breast 06/30/2013  . Malignant neoplasm of breast (female) ([place]) 05/25/2013  . Cancer ([place])   . Breast cancer ([place]) 04/09/2013  . Acid reflux 02/24/2010  . Hypercholesterolemia 02/24/2010  . Avitaminosis D 02/28/2009  . BP (high blood pressure) 07/01/2007    Past Medical History:  Diagnosis Date  . Cancer [facility]) July 2014   T2,N2a, Modified radical  mastectomy. 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 Hypertension   . Malignant neoplasm of breast (female), unspecified site July 2014   Her case was presented at the [facility] tumor board in July 2014. Recommendations were for chest wall/peripheral lymphatic radiation if metastatic disease was not identified, anti-estrogen therapy only if additional metastatic disease was detected on a PET CT. No plans for adjuvant chemotherapy if metastatic disease was identified was recommended.  . Sciatic pain 2015    Social History   Social History  . Marital status: Married    Spouse name: N/A  . Number of children: N/A  . Years of education: N/A   Occupational History  . Not on file.   Social History Main Topics  . Smoking status: Never Smoker  . Smokeless tobacco: Never Used  . Alcohol use No  . Drug use: No  . Sexual activity: Not on file   Other Topics Concern  . Not on file   Social History Narrative  . No narrative on file    Allergies  Allergen Reactions  . Tape Rash  . Gabapentin     Headache, "felt weird in the head"  . Sulfa Antibiotics   . Shellfish Allergy Rash    Review of Systems  Constitutional: Positive for malaise/fatigue.  HENT: Negative.   Eyes: Negative.   Respiratory: Positive for shortness of breath.   Cardiovascular: Negative.   Gastrointestinal: Negative.   Genitourinary: Negative.   Musculoskeletal: Positive for back pain.  Skin: Negative.   Neurological: Negative.   Endo/Heme/Allergies: Negative.   Psychiatric/Behavioral: Negative.     Immunization History  Administered Date(s) Administered  . Influenza, High Dose Seasonal PF 08/10/2015, 07/03/2016  . Pneumococcal Conjugate-13 08/24/2014  . Pneumococcal Polysaccharide-23 07/03/2016  . Td 07/23/2006    Objective:  BP (!) 142/80 (BP Location: Right Arm, Patient Position: Sitting, Cuff Size: Normal)   Pulse 74   Temp 97.6 F (36.4 C) (Oral)   Resp 16   Wt 152 lb (68.9  kg)   SpO2 97%   BMI 29.69 kg/m   Physical Exam  Constitutional: She is oriented to person, place, and time and well-developed, well-nourished, and in no distress.  HENT:  Head: Normocephalic and atraumatic.  Right Ear: External ear normal.  Left Ear: External ear normal.  Nose: Nose normal.  Eyes: Conjunctivae and EOM are normal. Pupils are equal, round, and reactive to light.  Neck: Normal range of motion. Neck supple.  Cardiovascular: Normal rate, regular rhythm, normal heart sounds and intact distal pulses.   Pulmonary/Chest: Effort normal and breath sounds normal.  Abdominal: Soft.  Musculoskeletal: Normal range of motion.  Neurological: She is alert and oriented to person, place, and time. She has normal reflexes. Gait normal. GCS score is 15.  Skin: Skin is warm and dry.  Psychiatric: Mood, memory, affect and judgment normal.    Lab Results  Component Value Date   WBC 7.4  09/20/2016   HGB 13.4 12/22/2014   HCT 38.1 09/20/2016   PLT 106 (L) 09/20/2016   GLUCOSE 110 (H) 07/06/2016   CHOL 182 07/06/2016   TRIG 95 07/06/2016   HDL 44 07/06/2016   LDLCALC 119 (H) 07/06/2016   TSH 1.820 09/20/2016    CMP     Component Value Date/Time   NA 138 07/06/2016 1129   NA 138 04/09/2013 1305   K 3.7 07/06/2016 1129   K 4.1 04/09/2013 1305   CL 95 (L) 07/06/2016 1129   CL 102 04/09/2013 1305   CO2 26 07/06/2016 1129   CO2 31 04/09/2013 1305   GLUCOSE 110 (H) 07/06/2016 1129   GLUCOSE 119 (H) 04/09/2013 1305   BUN 19 07/06/2016 1129   BUN 17 04/09/2013 1305   CREATININE 0.65 07/06/2016 1129   CREATININE 0.76 04/09/2013 1305   CALCIUM 9.6 07/06/2016 1129   CALCIUM 9.7 04/09/2013 1305   PROT 6.7 07/06/2016 1129   PROT 7.3 04/09/2013 1305   ALBUMIN 4.5 07/06/2016 1129   ALBUMIN 4.1 04/09/2013 1305   AST 22 07/06/2016 1129   AST 23 04/09/2013 1305   ALT 17 07/06/2016 1129   ALT 23 04/09/2013 1305   ALKPHOS 87 07/06/2016 1129   ALKPHOS 85 04/09/2013 1305   BILITOT  0.6 07/06/2016 1129   BILITOT 0.5 04/09/2013 1305   GFRNONAA 79 07/06/2016 1129   GFRNONAA >60 04/09/2013 1305   GFRAA 91 07/06/2016 1129   GFRAA >60 04/09/2013 1305    Assessment and Plan :  1. Essential hypertension Stable.  - CBC with Differential/Platelet - Renal function panel  2. Other depression Stable.   3. Elevated platelet count (HCC)  - CBC with Differential/Platelet - Iron 4.Breast cancer Treated with Femara.  HPI, Exam, and A&P Transcribed under the direction and in the presence of Carrick Rijos L. Cranford Mon, MD  Electronically Signed: Katina Dung, CMA I have done the exam and reviewed the above chart and it is accurate to the best of my knowledge. Development worker, community has been used in this note in any air is in the dictation or transcription are unintentional.  [facility] 01/23/2017 4:10 PM

## 2017-01-24 LAB — RENAL FUNCTION PANEL
Albumin: 4.5 g/dL (ref 3.2–4.6)
BUN / CREAT RATIO: 23 (ref 12–28)
BUN: 17 mg/dL (ref 10–36)
CO2: 29 mmol/L (ref 18–29)
CREATININE: 0.74 mg/dL (ref 0.57–1.00)
Calcium: 9.6 mg/dL (ref 8.7–10.3)
Chloride: 96 mmol/L (ref 96–106)
GFR, EST AFRICAN AMERICAN: 82 mL/min/{1.73_m2} (ref >59)
GFR, EST NON AFRICAN AMERICAN: 72 mL/min/{1.73_m2} (ref >59)
GLUCOSE: 106 mg/dL — AB (ref 65–99)
PHOSPHORUS: 3 mg/dL (ref 2.5–4.5)
POTASSIUM: 3.4 mmol/L — AB (ref 3.5–5.2)
SODIUM: 141 mmol/L (ref 134–144)

## 2017-01-24 LAB — CBC WITH DIFFERENTIAL/PLATELET
BASOS ABS: 0 10*3/uL (ref 0.0–0.2)
BASOS: 0 %
EOS (ABSOLUTE): 0.1 10*3/uL (ref 0.0–0.4)
EOS: 2 %
Hematocrit: 37.2 % (ref 34.0–46.6)
Hemoglobin: 11.8 g/dL (ref 11.1–15.9)
IMMATURE GRANULOCYTES: 1 %
Immature Grans (Abs): 0 10*3/uL (ref 0.0–0.1)
LYMPHS: 59 %
Lymphocytes Absolute: 3.7 10*3/uL — ABNORMAL HIGH (ref 0.7–3.1)
MCH: 25.1 pg — AB (ref 26.6–33.0)
MCHC: 31.7 g/dL (ref 31.5–35.7)
MCV: 79 fL (ref 79–97)
Monocytes Absolute: 0.4 10*3/uL (ref 0.1–0.9)
Monocytes: 6 %
NEUTROS ABS: 2 10*3/uL (ref 1.4–7.0)
NEUTROS PCT: 32 %
PLATELETS: 97 10*3/uL — AB (ref 150–379)
RBC: 4.71 x10E6/uL (ref 3.77–5.28)
RDW: 19.8 % — ABNORMAL HIGH (ref 12.3–15.4)
WBC: 6.2 10*3/uL (ref 3.4–10.8)

## 2017-01-24 LAB — IRON: IRON: 37 ug/dL (ref 27–139)

## 2017-02-11 ENCOUNTER — Other Ambulatory Visit: Payer: Self-pay

## 2017-02-11 DIAGNOSIS — Z1231 Encounter for screening mammogram for malignant neoplasm of breast: Secondary | ICD-10-CM

## 2017-02-24 DIAGNOSIS — N3001 Acute cystitis with hematuria: Secondary | ICD-10-CM | POA: Diagnosis not present

## 2017-02-24 DIAGNOSIS — R350 Frequency of micturition: Secondary | ICD-10-CM | POA: Diagnosis not present

## 2017-03-03 DIAGNOSIS — R300 Dysuria: Secondary | ICD-10-CM | POA: Diagnosis not present

## 2017-03-03 DIAGNOSIS — N3001 Acute cystitis with hematuria: Secondary | ICD-10-CM | POA: Diagnosis not present

## 2017-05-10 ENCOUNTER — Other Ambulatory Visit: Payer: Self-pay | Admitting: Family Medicine

## 2017-05-10 DIAGNOSIS — M810 Age-related osteoporosis without current pathological fracture: Secondary | ICD-10-CM

## 2017-05-10 NOTE — Telephone Encounter (Signed)
Dr. Rosanna Randy patient

## 2017-05-13 ENCOUNTER — Ambulatory Visit
Admission: RE | Admit: 2017-05-13 | Discharge: 2017-05-13 | Disposition: A | Discharge status: Home / Self Care | Payer: Medicare Other | Source: Ambulatory Visit | Attending: General Surgery | Admitting: General Surgery

## 2017-05-13 DIAGNOSIS — Z9012 Acquired absence of left breast and nipple: Secondary | ICD-10-CM | POA: Insufficient documentation

## 2017-05-13 DIAGNOSIS — Z1231 Encounter for screening mammogram for malignant neoplasm of breast: Secondary | ICD-10-CM | POA: Diagnosis not present

## 2017-05-21 ENCOUNTER — Ambulatory Visit (INDEPENDENT_AMBULATORY_CARE_PROVIDER_SITE_OTHER): Payer: Medicare Other | Admitting: General Surgery

## 2017-05-21 ENCOUNTER — Encounter: Payer: Self-pay | Admitting: General Surgery

## 2017-05-21 VITALS — BP 130/70 | HR 74 | Resp 14 | Ht 62.0 in | Wt 148.0 lb

## 2017-05-21 DIAGNOSIS — Z170 Estrogen receptor positive status [ER+]: Secondary | ICD-10-CM | POA: Diagnosis not present

## 2017-05-21 DIAGNOSIS — C50412 Malignant neoplasm of upper-outer quadrant of left female breast: Secondary | ICD-10-CM | POA: Diagnosis not present

## 2017-05-21 NOTE — Progress Notes (Signed)
Patient ID: Crystal Carpenter, female   DOB: July 15, 1924, 83 y.o.   MRN: [id number]  Chief Complaint  Patient presents with  . Follow-up    HPI Crystal Carpenter is a 83 y.o. female who presents for a breast evaluation. The most recent right breast mammogram was done on 05/13/2017.  Patient does perform regular self breast checks and gets regular mammograms done.    The patient continues to manage her own home.  HPI  Past Medical History:  Diagnosis Date  . Cancer [facility]) July 2014   T2,N2a, Modified radical mastectomy. 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 Hypertension   . Malignant neoplasm of breast (female), unspecified site July 2014   Her case was presented at the [facility] tumor board in July 2014. Recommendations were for chest wall/peripheral lymphatic radiation if metastatic disease was not identified, anti-estrogen therapy only if additional metastatic disease was detected on a PET CT. No plans for adjuvant chemotherapy if metastatic disease was identified was recommended.  . Sciatic pain 2015    Past Surgical History:  Procedure Laterality Date  . ABDOMINAL HYSTERECTOMY    . BREAST SURGERY Left 04-28-13   left mastectomy, SN biopsy  . EPIDURAL BLOCK INJECTION  2015   x3    Family History  Problem Relation Age of Onset  . Stroke Mother   . Heart disease Father   . Stroke Father   . Parkinson's disease Sister   . Parkinson's disease Brother     Social History Social History  Substance Use Topics  . Smoking status: Never Smoker  . Smokeless tobacco: Never Used  . Alcohol use No    Allergies  Allergen Reactions  . Tape Rash  . Gabapentin     Headache, "felt weird in the head"  . Sulfa Antibiotics   . Shellfish Allergy Rash    Current Outpatient Prescriptions  Medication Sig Dispense Refill  . acetaminophen (TYLENOL) 325 MG tablet Take 650 mg by mouth every 6 (six) hours as needed.    [name] alendronate (FOSAMAX) 70 MG tablet TAKE 1 TABLET BY  MOUTH EVERY 7 DAYS. TAKE WITH A FULL GLASS OF WATER ON AN EMPTY STOMACH 4 tablet 0  . b complex vitamins tablet Take 1 tablet by mouth daily.    . famotidine (PEPCID) 10 MG tablet Take 10 mg by mouth daily as needed for heartburn or indigestion.    . hydrochlorothiazide (HYDRODIURIL) 25 MG tablet TAKE 1 TABLET BY MOUTH EVERY DAY. 90 tablet 0  . ibuprofen (ADVIL,MOTRIN) 100 MG tablet Take 100 mg by mouth every 6 (six) hours as needed for fever.    [name] letrozole (FEMARA) 2.5 MG tablet Take 1 tablet (2.5 mg total) by mouth daily. 90 tablet 3  . montelukast (SINGULAIR) 10 MG tablet Take 1 tablet (10 mg total) by mouth at bedtime. 30 tablet 0  . naproxen sodium (ALEVE) 220 MG tablet Take by mouth.    . potassium chloride (MICRO-K) 10 MEQ CR capsule TAKE 1 CAPSULE BY MOUTH EVERY DAY. 90 capsule 3  . Saline 0.2 % SOLN     . sertraline (ZOLOFT) 100 MG tablet TAKE ONE TABLET BY MOUTH ONCE A DAY. 30 tablet 0  . traMADol (ULTRAM) 50 MG tablet Take 1 tablet (50 mg total) by mouth every 4 (four) hours as needed. 55 tablet 0   No current facility-administered medications for this visit.     Review of Systems Review of Systems  Constitutional: Negative.   Respiratory:  Negative.   Cardiovascular: Negative.     Blood pressure 130/70, pulse 74, resp. rate 14, height _0  (1.575 m), weight 148 lb (67.1 kg).  Physical Exam Physical Exam  Constitutional: She is oriented to person, place, and time. She appears well-developed and well-nourished.  Eyes: Conjunctivae are normal. No scleral icterus.  Cardiovascular: Normal rate, regular rhythm and normal heart sounds.   Pulmonary/Chest: Effort normal and breath sounds normal. Right breast exhibits no inverted nipple, no mass, no nipple discharge, no skin change and no tenderness.  Left mastectomy site is clean and well healed.  Lymphadenopathy:       Left cervical: No superficial cervical adenopathy present.    She has no axillary adenopathy.       Left: No  supraclavicular adenopathy present.  Neurological: She is alert and oriented to person, place, and time.  Skin: Skin is warm and dry.    Data Reviewed Screening mammogram dated 05/13/2017 was reviewed. No interval change. BI-RADS-1.  Assessment    Doing well with no evidence of recurrent disease now 4 years after management of her T2, N2 a carcinoma of the left breast.    Plan    The patient was given a new prescription for a breast prosthesis and surgical bras.    Patient will be asked to return to the office in one year with a right screening mammogram.   HPI, Physical Exam, Assessment and Plan have been scribed under the direction and in the presence of Hervey Ard, MD.  Gaspar Cola, CMA  I have completed the exam and reviewed the above documentation for accuracy and completeness.  I agree with the above.  Haematologist has been used and any errors in dictation or transcription are unintentional.  Hervey Ard, M.D., F.A.C.S.   Crystal Carpenter 05/22/2017, 7:07 AM

## 2017-05-21 NOTE — Patient Instructions (Signed)
Patient will be asked to return to the office in one year with a right screening mammogram. 

## 2017-05-22 DIAGNOSIS — C50412 Malignant neoplasm of upper-outer quadrant of left female breast: Secondary | ICD-10-CM | POA: Insufficient documentation

## 2017-05-22 DIAGNOSIS — Z170 Estrogen receptor positive status [ER+]: Principal | ICD-10-CM | POA: Insufficient documentation

## 2017-06-05 ENCOUNTER — Other Ambulatory Visit: Payer: Self-pay | Admitting: Family Medicine

## 2017-06-05 DIAGNOSIS — M810 Age-related osteoporosis without current pathological fracture: Secondary | ICD-10-CM

## 2017-06-10 DIAGNOSIS — H52223 Regular astigmatism, bilateral: Secondary | ICD-10-CM | POA: Diagnosis not present

## 2017-06-10 DIAGNOSIS — H1089 Other conjunctivitis: Secondary | ICD-10-CM | POA: Diagnosis not present

## 2017-06-20 ENCOUNTER — Telehealth: Payer: Self-pay | Admitting: Family Medicine

## 2017-06-20 NOTE — Telephone Encounter (Signed)
Pt called asking for an Rx for UTI to be sent to [facility]. Pt stated she has had pain and pressure and increase frequency of urination since yesterday 06/19/17. I advised that we don't treat over the phone and offered to schedule appt. Pt stated that she is very busy with her husband that had a heart attack. Pt stated that if Dr. Rosanna Randy can't send in an Rx she will try to figure something out to make arrangements to get in the office. Please advise. Thanks TNP

## 2017-06-20 NOTE — Telephone Encounter (Signed)
Please advise. Thanks.  

## 2017-06-21 ENCOUNTER — Ambulatory Visit (INDEPENDENT_AMBULATORY_CARE_PROVIDER_SITE_OTHER): Payer: Medicare Other | Admitting: Family Medicine

## 2017-06-21 ENCOUNTER — Encounter: Payer: Self-pay | Admitting: Family Medicine

## 2017-06-21 VITALS — BP 96/60 | HR 65 | Temp 98.0°F | Wt 146.2 lb

## 2017-06-21 DIAGNOSIS — H9201 Otalgia, right ear: Secondary | ICD-10-CM | POA: Diagnosis not present

## 2017-06-21 DIAGNOSIS — R350 Frequency of micturition: Secondary | ICD-10-CM

## 2017-06-21 DIAGNOSIS — R300 Dysuria: Secondary | ICD-10-CM

## 2017-06-21 LAB — POCT URINALYSIS DIPSTICK
BILIRUBIN UA: NEGATIVE
GLUCOSE UA: NEGATIVE
Ketones, UA: NEGATIVE
NITRITE UA: POSITIVE
Protein, UA: NEGATIVE
Spec Grav, UA: 1.025 (ref 1.010–1.025)
Urobilinogen, UA: 1 E.U./dL
pH, UA: 6 (ref 5.0–8.0)

## 2017-06-21 MED ORDER — AMOXICILLIN 875 MG PO TABS: 875.0000 mg | ORAL_TABLET | Freq: Two times a day (BID) | ORAL | 0 refills | Status: DC

## 2017-06-21 NOTE — Progress Notes (Signed)
[name]    DOB: [birth year]   83 y.o.   MRN: [id number] Visit Date: 06/21/2017  Today's Provider: Vernie Murders, PA   Chief Complaint  Patient presents with  . Urinary Tract Infection   Subjective:    Urinary Tract Infection   This is a new problem. Episode onset: 5 days ago. The problem has been unchanged. The quality of the pain is described as burning. There has been no fever. Associated symptoms include chills and frequency. Treatments tried: Azo. The treatment provided mild relief.  Otalgia   There is pain in the right ear. This is a new problem. The current episode started 1 to 4 weeks ago. The problem has been waxing and waning. She has tried nothing for the symptoms. Her past medical history is significant for hearing loss.   Past Medical History:  Diagnosis Date  . Cancer [facility]) July 2014   T2,N2a, Modified radical mastectomy. 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 Hypertension   . Malignant neoplasm of breast (female), unspecified site July 2014   Her case was presented at the [facility] tumor board in July 2014. Recommendations were for chest wall/peripheral lymphatic radiation if metastatic disease was not identified, anti-estrogen therapy only if additional metastatic disease was detected on a PET CT. No plans for adjuvant chemotherapy if metastatic disease was identified was recommended.  . Sciatic pain 2015   Past Surgical History:  Procedure Laterality Date  . ABDOMINAL HYSTERECTOMY    . BREAST SURGERY Left 04-28-13   left mastectomy, SN biopsy  . EPIDURAL BLOCK INJECTION  2015   x3   Family History  Problem Relation Age of Onset  . Stroke Mother   . Heart disease Father   . Stroke Father   . Parkinson's disease Sister   . Parkinson's disease Brother    Allergies  Allergen Reactions  . Tape Rash  . Gabapentin     Headache, "felt weird in the head"  . Sulfa Antibiotics   . Shellfish Allergy Rash     Previous  Medications   ACETAMINOPHEN (TYLENOL) 325 MG TABLET    Take 650 mg by mouth every 6 (six) hours as needed.   ALENDRONATE (FOSAMAX) 70 MG TABLET    TAKE 1 TABLET BY MOUTH EVERY 7 DAYS. TAKE WITH A FULL GLASS OF WATER ON AN EMPTY STOMACH.   B COMPLEX VITAMINS TABLET    Take 1 tablet by mouth daily.   CHLORHEXIDINE (PERIDEX) 0.12 % SOLUTION       FAMOTIDINE (PEPCID) 10 MG TABLET    Take 10 mg by mouth daily as needed for heartburn or indigestion.   HYDROCHLOROTHIAZIDE (HYDRODIURIL) 25 MG TABLET    TAKE 1 TABLET BY MOUTH EVERY DAY.   IBUPROFEN (ADVIL,MOTRIN) 100 MG TABLET    Take 100 mg by mouth every 6 (six) hours as needed for fever.   LETROZOLE (FEMARA) 2.5 MG TABLET    Take 1 tablet (2.5 mg total) by mouth daily.   MONTELUKAST (SINGULAIR) 10 MG TABLET    Take 1 tablet (10 mg total) by mouth at bedtime.   NAPROXEN SODIUM (ALEVE) 220 MG TABLET    Take by mouth.   POTASSIUM CHLORIDE (MICRO-K) 10 MEQ CR CAPSULE    TAKE 1 CAPSULE BY MOUTH EVERY DAY.   SALINE 0.2 % SOLN       SERTRALINE (ZOLOFT) 100 MG TABLET    TAKE ONE TABLET BY MOUTH ONCE A DAY.   TOBRAMYCIN (TOBREX)  0.3 % OPHTHALMIC SOLUTION       TRAMADOL (ULTRAM) 50 MG TABLET    Take 1 tablet (50 mg total) by mouth every 4 (four) hours as needed.    Review of Systems  Constitutional: Positive for chills.  HENT: Positive for ear pain.   Respiratory: Negative.   Cardiovascular: Negative.   Genitourinary: Positive for dysuria and frequency.    Social History  Substance Use Topics  . Smoking status: Never Smoker  . Smokeless tobacco: Never Used  . Alcohol use No   Objective:   BP 96/60 (BP Location: Right Arm, Patient Position: Sitting, Cuff Size: Normal)   Pulse 65   Temp 98 F (36.7 C) (Oral)   Wt 146 lb 3.2 oz (66.3 kg)   SpO2 95%   BMI 26.74 kg/m   Physical Exam  Constitutional: She is oriented to person, place, and time. She appears well-developed and well-nourished. No distress.  HENT:  Head: Normocephalic and  atraumatic.  Right Ear: Hearing normal.  Left Ear: Hearing normal.  Nose: Nose normal.  Wears hearing aids bilaterally. Small amount of wax in right ear canal. Questionable fluid bubble behind the left TM. No erythema or pain.  Eyes: Conjunctivae and lids are normal. Right eye exhibits no discharge. Left eye exhibits no discharge. No scleral icterus.  Cardiovascular: Normal rate and regular rhythm.   Pulmonary/Chest: Effort normal. No respiratory distress.  Abdominal: Soft. Bowel sounds are normal.  No CVA tenderness to percussion posteriorly. No significant bladder pressure/pain to palpation.  Neurological: She is alert and oriented to person, place, and time.  Skin: Skin is intact. No lesion and no rash noted.  Psychiatric: She has a normal mood and affect. Her speech is normal and behavior is normal. Thought content normal.      Assessment & Plan:     1. Dysuria Onset 5 days ago. No hematuria or fever. Some spasm in lower abdomen that is relieved with use of AZO-Standard. Pyuria on microscopic exam. Treat with antibiotic and use AZO-Standard prn. - POCT Urinalysis Dipstick - Urine Culture - amoxicillin (AMOXIL) 875 MG tablet; Take 1 tablet (875 mg total) by mouth 2 (two) times daily.  Dispense: 14 tablet; Refill: 0  2. Urinary frequency Onset 4-5 days ago with burning discomfort. Urine microscopic exam shows [facility] WBC's with 2-3 + bacteria per hpf. Recommend increase in fluid intake and start antibiotic. Will get C&S and recheck pending report. - POCT Urinalysis Dipstick - Urine Culture - amoxicillin (AMOXIL) 875 MG tablet; Take 1 tablet (875 mg total) by mouth 2 (two) times daily.  Dispense: 14 tablet; Refill: 0  3. Right ear pain  Has noticed an intermittent discomfort in the right ear. No drainage or URI symptoms. History of hearing loss and uses hearing aids bilaterally. Normally has follow up with Dr. Pryor Ochoa (ENT). No sign of infection on exam. Small amount of wax in the right  ear canal. Should follow up with Dr. Pryor Ochoa if discomfort returns/persists.

## 2017-06-24 LAB — URINE CULTURE
MICRO NUMBER: 80986206
SPECIMEN QUALITY: ADEQUATE

## 2017-06-26 ENCOUNTER — Telehealth: Payer: Self-pay

## 2017-06-26 NOTE — Telephone Encounter (Signed)
Patient is requesting urine culture results from 06/21/17. She seen Simona Huh. She states symptoms have improved but still remain. She will finish antibiotic on 9/13. Please advise.

## 2017-06-27 ENCOUNTER — Encounter: Payer: Self-pay | Admitting: Family Medicine

## 2017-06-27 MED ORDER — AMOXICILLIN-POT CLAVULANATE 875-125 MG PO TABS: 1.0000 | ORAL_TABLET | Freq: Two times a day (BID) | ORAL | 0 refills | Status: DC

## 2017-06-27 NOTE — Telephone Encounter (Signed)
Sensitivity report showed resistance to the Amoxicillin alone, Cipro and sulfa drugs (known allergy for this patient). Recommend switch to Augmentin 875 mg BID #14 and increase water intake. Recheck urine in 7-10 days if any symptoms remain.

## 2017-06-27 NOTE — Telephone Encounter (Signed)
Ask lab where the final report with sensitivities are. Only received a preliminary report.

## 2017-06-27 NOTE — Telephone Encounter (Signed)
Final report on your desk.

## 2017-06-27 NOTE — Telephone Encounter (Signed)
Pt has called wanting to know the results from her culture  Call back Is [phone] or [phone]  Thanks teri

## 2017-06-27 NOTE — Telephone Encounter (Signed)
Patient advised. RX sent to [facility].  

## 2017-07-15 ENCOUNTER — Other Ambulatory Visit: Payer: Self-pay | Admitting: Family Medicine

## 2017-07-16 ENCOUNTER — Encounter: Payer: Self-pay | Admitting: Family Medicine

## 2017-07-16 ENCOUNTER — Ambulatory Visit (INDEPENDENT_AMBULATORY_CARE_PROVIDER_SITE_OTHER): Payer: Medicare Other | Admitting: Family Medicine

## 2017-07-16 VITALS — BP 102/58 | HR 79 | Temp 98.5°F | Wt 145.8 lb

## 2017-07-16 DIAGNOSIS — N3000 Acute cystitis without hematuria: Secondary | ICD-10-CM

## 2017-07-16 DIAGNOSIS — J069 Acute upper respiratory infection, unspecified: Secondary | ICD-10-CM | POA: Diagnosis not present

## 2017-07-16 LAB — POCT URINALYSIS DIPSTICK
BILIRUBIN UA: NEGATIVE
Glucose, UA: NEGATIVE
KETONES UA: NEGATIVE
Leukocytes, UA: NEGATIVE
Nitrite, UA: NEGATIVE
PH UA: 8 (ref 5.0–8.0)
RBC UA: NEGATIVE
SPEC GRAV UA: 1.01 (ref 1.010–1.025)
Urobilinogen, UA: 0.2 E.U./dL

## 2017-07-16 NOTE — Progress Notes (Signed)
[name]    DOB: [birth year]   83 y.o.   MRN: [id number] Visit Date: 07/16/2017  Today's Provider: Vernie Murders, PA   Chief Complaint  Patient presents with  . URI   Subjective:    URI   This is a new problem. Episode onset: 3 weeks. The problem has been gradually worsening. There has been no fever. Associated symptoms include congestion, coughing, ear pain, nausea, sinus pain and a sore throat. Associated symptoms comments: Dizziness .   Past Medical History:  Diagnosis Date  . Cancer [facility]) July 2014   T2,N2a, Modified radical mastectomy. 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 Hypertension   . Malignant neoplasm of breast (female), unspecified site July 2014   Her case was presented at the [facility] tumor board in July 2014. Recommendations were for chest wall/peripheral lymphatic radiation if metastatic disease was not identified, anti-estrogen therapy only if additional metastatic disease was detected on a PET CT. No plans for adjuvant chemotherapy if metastatic disease was identified was recommended.  . Sciatic pain 2015   Past Surgical History:  Procedure Laterality Date  . ABDOMINAL HYSTERECTOMY    . BREAST SURGERY Left 04-28-13   left mastectomy, SN biopsy  . EPIDURAL BLOCK INJECTION  2015   x3   Family History  Problem Relation Age of Onset  . Stroke Mother   . Heart disease Father   . Stroke Father   . Parkinson's disease Sister   . Parkinson's disease Brother    Allergies  Allergen Reactions  . Tape Rash  . Gabapentin     Headache, "felt weird in the head"  . Sulfa Antibiotics   . Shellfish Allergy Rash     Previous Medications   ACETAMINOPHEN (TYLENOL) 325 MG TABLET    Take 650 mg by mouth every 6 (six) hours as needed.   ALENDRONATE (FOSAMAX) 70 MG TABLET    TAKE 1 TABLET BY MOUTH EVERY 7 DAYS. TAKE WITH A FULL GLASS OF WATER ON AN EMPTY STOMACH.   B COMPLEX VITAMINS TABLET    Take 1 tablet by mouth daily.   CHLORHEXIDINE (PERIDEX) 0.12 % SOLUTION       FAMOTIDINE (PEPCID) 10 MG TABLET    Take 10 mg by mouth daily as needed for heartburn or indigestion.   HYDROCHLOROTHIAZIDE (HYDRODIURIL) 25 MG TABLET    TAKE 1 TABLET BY MOUTH EVERY DAY.   IBUPROFEN (ADVIL,MOTRIN) 100 MG TABLET    Take 100 mg by mouth every 6 (six) hours as needed for fever.   LETROZOLE (FEMARA) 2.5 MG TABLET    Take 1 tablet (2.5 mg total) by mouth daily.   MONTELUKAST (SINGULAIR) 10 MG TABLET    Take 1 tablet (10 mg total) by mouth at bedtime.   NAPROXEN SODIUM (ALEVE) 220 MG TABLET    Take by mouth.   POTASSIUM CHLORIDE (MICRO-K) 10 MEQ CR CAPSULE    TAKE 1 CAPSULE BY MOUTH EVERY DAY.   SALINE 0.2 % SOLN       SERTRALINE (ZOLOFT) 100 MG TABLET    TAKE 1 TABLET BY MOUTH EVERY DAY   TOBRAMYCIN (TOBREX) 0.3 % OPHTHALMIC SOLUTION       TRAMADOL (ULTRAM) 50 MG TABLET    Take 1 tablet (50 mg total) by mouth every 4 (four) hours as needed.    Review of Systems  Constitutional: Negative.   HENT: Positive for congestion, ear pain, sinus pain and sore throat.   Respiratory: Positive  for cough.   Cardiovascular: Negative.   Gastrointestinal: Positive for nausea.  Genitourinary: Positive for frequency.    Social History  Substance Use Topics  . Smoking status: Never Smoker  . Smokeless tobacco: Never Used  . Alcohol use No   Objective:   BP (!) 102/58 (BP Location: Right Arm, Patient Position: Sitting, Cuff Size: Normal)   Pulse 79   Temp 98.5 F (36.9 C) (Oral)   Wt 145 lb 12.8 oz (66.1 kg)   SpO2 97%   BMI 26.67 kg/m   Physical Exam  Constitutional: She is oriented to person, place, and time. She appears well-developed and well-nourished.  HENT:  Head: Normocephalic.  Right Ear: External ear normal.  Left Ear: External ear normal.  Nose: Nose normal.  Mouth/Throat: Oropharynx is clear and moist.  Decreased transillumination of the left maxillary sinus. No tenderness.  Eyes: Conjunctivae are normal.  Neck: Neck  supple.  Cardiovascular: Normal rate and regular rhythm.   Pulmonary/Chest: Effort normal and breath sounds normal.  Abdominal: Soft. Bowel sounds are normal.  Neurological: She is alert and oriented to person, place, and time.      Assessment & Plan:     1. Acute cystitis without hematuria Much improved since treating E.coli isolated on the urine culture. Urinalysis essentially clear today. Finished the Augmentin and no burning or stinging symptoms. Still has a little frequency. Encouraged to drink extra fluids to flush out the urinary tract. Recheck prn. - POCT Urinalysis Dipstick  2. Viral upper respiratory tract infection Mild cough with nasal congestion and PND. No fever or purulent sputum production. May use Mucinex-DM and increase fluid intake. Recheck prn.

## 2017-07-25 ENCOUNTER — Ambulatory Visit: Payer: Medicare Other

## 2017-07-25 ENCOUNTER — Ambulatory Visit: Payer: Medicare Other | Admitting: Family Medicine

## 2017-08-07 ENCOUNTER — Ambulatory Visit (INDEPENDENT_AMBULATORY_CARE_PROVIDER_SITE_OTHER): Payer: Medicare Other

## 2017-08-07 ENCOUNTER — Ambulatory Visit (INDEPENDENT_AMBULATORY_CARE_PROVIDER_SITE_OTHER): Payer: Medicare Other | Admitting: Family Medicine

## 2017-08-07 VITALS — BP 132/64 | HR 72 | Temp 98.3°F | Resp 18 | Wt 147.6 lb

## 2017-08-07 VITALS — BP 132/64 | HR 72 | Temp 98.3°F | Ht 62.0 in | Wt 147.6 lb

## 2017-08-07 DIAGNOSIS — M816 Localized osteoporosis [Lequesne]: Secondary | ICD-10-CM | POA: Diagnosis not present

## 2017-08-07 DIAGNOSIS — I10 Essential (primary) hypertension: Secondary | ICD-10-CM | POA: Diagnosis not present

## 2017-08-07 DIAGNOSIS — Z853 Personal history of malignant neoplasm of breast: Secondary | ICD-10-CM | POA: Diagnosis not present

## 2017-08-07 DIAGNOSIS — Z23 Encounter for immunization: Secondary | ICD-10-CM

## 2017-08-07 DIAGNOSIS — G629 Polyneuropathy, unspecified: Secondary | ICD-10-CM

## 2017-08-07 DIAGNOSIS — F3289 Other specified depressive episodes: Secondary | ICD-10-CM | POA: Diagnosis not present

## 2017-08-07 DIAGNOSIS — Z0000 Encounter for general adult medical examination without abnormal findings: Secondary | ICD-10-CM | POA: Diagnosis not present

## 2017-08-07 DIAGNOSIS — R05 Cough: Secondary | ICD-10-CM

## 2017-08-07 DIAGNOSIS — R059 Cough, unspecified: Secondary | ICD-10-CM

## 2017-08-07 NOTE — Patient Instructions (Signed)
Give [place] a call to schedule your 6 months follow up when you can please.

## 2017-08-07 NOTE — Progress Notes (Signed)
Subjective:   Crystal Carpenter is a 83 y.o. female who presents for Medicare Annual (Subsequent) preventive examination.  Review of Systems:  N/A  Cardiac Risk Factors include: advanced age (>53mn, >>40women);dyslipidemia;hypertension     Objective:     Vitals: BP 132/64 (BP Location: Left Arm)   Pulse 72   Temp 98.3 F (36.8 C) (Oral)   Ht '5\' 2"'$  (1.575 m)   Wt 147 lb 9.6 oz (67 kg)   BMI 27.00 kg/m   Body mass index is 27 kg/m.   Tobacco History  Smoking Status  . Never Smoker  Smokeless Tobacco  . Never Used     Counseling given: Not Answered   Past Medical History:  Diagnosis Date  . Cancer ([facility] July 2014   T2,N2a, Modified radical mastectomy. 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 Hypertension   . Malignant neoplasm of breast (female), unspecified site July 2014   [facility]. Recommendations were for chest wall/peripheral lymphatic radiation if metastatic disease was not identified, anti-estrogen therapy only if additional metastatic disease was detected on a PET CT. No plans for adjuvant chemotherapy if metastatic disease was identified was recommended.  . Sciatic pain 2015   Past Surgical History:  Procedure Laterality Date  . ABDOMINAL HYSTERECTOMY    . BREAST SURGERY Left 04-28-13   left mastectomy, SN biopsy  . EPIDURAL BLOCK INJECTION  2015   x3   Family History  Problem Relation Age of Onset  . Stroke Mother   . Heart disease Father   . Stroke Father   . Parkinson's disease Sister   . Parkinson's disease Brother    History  Sexual Activity  . Sexual activity: Not on file    Outpatient Encounter Prescriptions as of 08/07/2017  Medication Sig  . acetaminophen (TYLENOL) 325 MG tablet Take 650 mg by mouth every 6 (six) hours as needed.  .Marland Kitchenalendronate (FOSAMAX) 70 MG tablet TAKE 1 TABLET BY MOUTH EVERY 7 DAYS. TAKE WITH A FULL GLASS OF WATER ON AN EMPTY STOMACH.  .Marland Kitchenb  complex vitamins tablet Take 1 tablet by mouth daily.  . famotidine (PEPCID) 10 MG tablet Take 10 mg by mouth daily as needed for heartburn or indigestion.  . hydrochlorothiazide (HYDRODIURIL) 25 MG tablet TAKE 1 TABLET BY MOUTH EVERY DAY.  .Marland Kitchenibuprofen (ADVIL,MOTRIN) 100 MG tablet Take 100 mg by mouth every 6 (six) hours as needed for fever.  .Marland Kitchenletrozole (FEMARA) 2.5 MG tablet Take 1 tablet (2.5 mg total) by mouth daily.  . naproxen sodium (ALEVE) 220 MG tablet Take by mouth.  . potassium chloride (MICRO-K) 10 MEQ CR capsule TAKE 1 CAPSULE BY MOUTH EVERY DAY.  . Saline 0.2 % SOLN   . sertraline (ZOLOFT) 100 MG tablet TAKE 1 TABLET BY MOUTH EVERY DAY  . [DISCONTINUED] chlorhexidine (PERIDEX) 0.12 % solution   . [DISCONTINUED] montelukast (SINGULAIR) 10 MG tablet Take 1 tablet (10 mg total) by mouth at bedtime.  . [DISCONTINUED] tobramycin (TOBREX) 0.3 % ophthalmic solution   . [DISCONTINUED] traMADol (ULTRAM) 50 MG tablet Take 1 tablet (50 mg total) by mouth every 4 (four) hours as needed. (Patient not taking: Reported on 08/07/2017)   No facility-administered encounter medications on file as of 08/07/2017.     Activities of Daily Living In your present state of health, do you have any difficulty performing the following activities: 08/07/2017 06/21/2017  Hearing? YTempie Donning Comment  wears bilateral hearing aids -  Vision? N N  Difficulty concentrating or making decisions? Y N  Walking or climbing stairs? Y Y  Comment due to weak knees -  Dressing or bathing? N N  Doing errands, shopping? N N  Preparing Food and eating ? N -  Using the Toilet? N -  In the past six months, have you accidently leaked urine? Y -  Comment some, wears protection -  Do you have problems with loss of bowel control? N -  Managing your Medications? N -  Managing your Finances? N -  Housekeeping or managing your Housekeeping? N -  Some recent data might be hidden    Patient Care Team: Jerrol Banana., MD as  PCP - General (Family Medicine) Bary Castilla, Forest Gleason, MD as Consulting Physician (General Surgery) Idelle Leech, OD as Consulting Physician (Optometry)    Assessment:     Exercise Activities and Dietary recommendations Current Exercise Habits: The patient does not participate in regular exercise at present (stays busy), Exercise limited by: orthopedic condition(s)  Goals    . Increase water intake          Recommend increasing water intake to 4-6 glasses of water a day.       Fall Risk Fall Risk  08/07/2017 06/21/2017 09/20/2016 12/28/2015  Falls in the past year? Yes Yes No No  Number falls in past yr: 1 1 - -  Injury with Fall? No No - -  Follow up Falls prevention discussed - - -   Depression Screen PHQ 2/9 Scores 08/07/2017 06/21/2017 09/20/2016 12/28/2015  PHQ - 2 Score 0 1 2 0  PHQ- 9 Score 0 3 5 -     Cognitive Function: Pt declined screening today.         Immunization History  Administered Date(s) Administered  . Influenza Split 08/24/2006, 08/29/2009, 07/26/2010, 08/13/2011, 09/16/2012  . Influenza, High Dose Seasonal PF 08/24/2014, 08/10/2015, 07/03/2016, 08/07/2017  . Influenza,inj,Quad PF,6+ Mos 06/29/2013  . Pneumococcal Conjugate-13 08/24/2014  . Pneumococcal Polysaccharide-23 07/03/2016  . Td 07/23/2006   Screening Tests Health Maintenance  Topic Date Due  . TETANUS/TDAP  01/12/2018 (Originally 07/23/2016)  . INFLUENZA VACCINE  Completed  . DEXA SCAN  Completed  . PNA vac Low Risk Adult  Completed      Plan:  I have personally reviewed and addressed the Medicare Annual Wellness questionnaire and have noted the following in the patient's chart:  A. Medical and social history B. Use of alcohol, tobacco or illicit drugs  C. Current medications and supplements D. Functional ability and status E.  Nutritional status F.  Physical activity G. Advance directives H. List of other physicians I.  Hospitalizations, surgeries, and ER visits in previous 12  months J.  Circle such as hearing and vision if needed, cognitive and depression L. Referrals and appointments - none  In addition, I have reviewed and discussed with patient certain preventive protocols, quality metrics, and best practice recommendations. A written personalized care plan for preventive services as well as general preventive health recommendations were provided to patient.  See attached scanned questionnaire for additional information.   Signed,  Fabio Neighbors, LPN Nurse Health Advisor   MD Recommendations: None. Pt declined the tetanus vaccine today.

## 2017-08-07 NOTE — Progress Notes (Signed)
Crystal Carpenter  MRN: [id number] DOB: 04/18/1924  Subjective:  HPI  Patient is here for follow up. Last office visit was on 01/23/17 for routine follow up. Patient saw [facility] for Wellness Visit earlier today. She is not checking her b/p. She is still having shortness of breath. BP Readings from Last 3 Encounters:  08/07/17 132/64  08/07/17 132/64  07/16/17 (!) 102/58   She has been coughing up thick phlegm and wanted to see what she can do for this. Renal, Iron, CBC were checked on 01/23/17=and potassium was noted to be borderline on that check. Last TSH was checked on 09/20/16, Lipid 07/06/16. Depression screen [facility] 2/9 08/07/2017 06/21/2017 09/20/2016  Decreased Interest 0 0 1  Down, Depressed, Hopeless 0 1 1  PHQ - 2 Score 0 1 2  Altered sleeping 0 0 0  Tired, decreased energy - 2 3  Change in appetite - 0 0  Feeling bad or failure about yourself  - 0 0  Trouble concentrating - 0 0  Moving slowly or fidgety/restless - 0 0  Suicidal thoughts - 0 0  PHQ-9 Score 0 3 5  Difficult doing work/chores - Not difficult at all Very difficult   Patient Active Problem List   Diagnosis Date Noted  . Malignant neoplasm of upper-outer quadrant of left breast in female, estrogen receptor positive ([place]) 05/22/2017  . Allergic rhinitis 08/25/2015  . Arthropathia 08/25/2015  . Clinical depression 08/25/2015  . Urinary system disease 08/25/2015  . Calcium blood increased 08/25/2015  . Lumbar canal stenosis 06/08/2014  . Personal history of malignant neoplasm of breast 06/30/2013  . Malignant neoplasm of breast (female) ([place]) 05/25/2013  . Cancer ([place])   . Breast cancer ([place]) 04/09/2013  . Acid reflux 02/24/2010  . Hypercholesterolemia 02/24/2010  . Avitaminosis D 02/28/2009  . BP (high blood pressure) 07/01/2007    Past Medical History:  Diagnosis Date  . Cancer [facility]) July 2014   T2,N2a, Modified radical mastectomy. 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  Hypertension   . Malignant neoplasm of breast (female), unspecified site July 2014   Her case was presented at the [facility] tumor board in July 2014. Recommendations were for chest wall/peripheral lymphatic radiation if metastatic disease was not identified, anti-estrogen therapy only if additional metastatic disease was detected on a PET CT. No plans for adjuvant chemotherapy if metastatic disease was identified was recommended.  . Sciatic pain 2015    Social History   Social History  . Marital status: Married    Spouse name: N/A  . Number of children: N/A  . Years of education: N/A   Occupational History  . Not on file.   Social History Main Topics  . Smoking status: Never Smoker  . Smokeless tobacco: Never Used  . Alcohol use No  . Drug use: No  . Sexual activity: Not on file   Other Topics Concern  . Not on file   Social History Narrative  . No narrative on file    Outpatient Encounter Prescriptions as of 08/07/2017  Medication Sig Note  . acetaminophen (TYLENOL) 325 MG tablet Take 650 mg by mouth every 6 (six) hours as needed.   [name] alendronate (FOSAMAX) 70 MG tablet TAKE 1 TABLET BY MOUTH EVERY 7 DAYS. TAKE WITH A FULL GLASS OF WATER ON AN EMPTY STOMACH.   [name] b complex vitamins tablet Take 1 tablet by mouth daily.   . famotidine (PEPCID) 10 MG tablet Take 10 mg by mouth  daily as needed for heartburn or indigestion.   . hydrochlorothiazide (HYDRODIURIL) 25 MG tablet TAKE 1 TABLET BY MOUTH EVERY DAY.   [name] ibuprofen (ADVIL,MOTRIN) 100 MG tablet Take 100 mg by mouth every 6 (six) hours as needed for fever.   [name] letrozole (FEMARA) 2.5 MG tablet Take 1 tablet (2.5 mg total) by mouth daily.   . naproxen sodium (ALEVE) 220 MG tablet Take by mouth. 08/25/2015: Medication taken as needed.  Received from: [employer]  . potassium chloride (MICRO-K) 10 MEQ CR capsule TAKE 1 CAPSULE BY MOUTH EVERY DAY.   . Saline 0.2 % SOLN  08/25/2015: Received from: [employer]  . sertraline (ZOLOFT) 100 MG tablet TAKE 1 TABLET BY MOUTH EVERY DAY    No facility-administered encounter medications on file as of 08/07/2017.     Allergies  Allergen Reactions  . Tape Rash  . Gabapentin     Headache, "felt weird in the head"  . Sulfa Antibiotics   . Shellfish Allergy Rash    Review of Systems  Constitutional: Positive for malaise/fatigue.  Eyes: Negative.   Respiratory: Positive for cough, sputum production and shortness of breath.   Cardiovascular: Negative.   Gastrointestinal: Negative.   Genitourinary: Positive for urgency.  Musculoskeletal: Positive for joint pain.       Stiffness  Skin: Negative.   Neurological: Positive for tingling (in feet).  Endo/Heme/Allergies: Negative.   Psychiatric/Behavioral: Negative.     Objective:  BP 132/64   Pulse 72   Temp 98.3 F (36.8 C)   Resp 18   Wt 147 lb 9.6 oz (67 kg)   BMI 27.00 kg/m   Physical Exam  Constitutional: She is oriented to person, place, and time and well-developed, well-nourished, and in no distress.  HENT:  Head: Normocephalic and atraumatic.  Eyes: Pupils are equal, round, and reactive to light. Conjunctivae are normal. No scleral icterus.  Neck: Normal range of motion. Neck supple. No thyromegaly present.  Cardiovascular: Normal rate, regular rhythm, normal heart sounds and intact distal pulses.  Exam reveals no gallop.   No murmur heard. Pulmonary/Chest: Effort normal and breath sounds normal. No respiratory distress. She has no wheezes.  Abdominal: Soft. Bowel sounds are normal.  Neurological: She is alert and oriented to person, place, and time. Gait abnormal.  Using a cane to ambulate  Skin: Skin is warm and dry.  Psychiatric: Mood, memory, affect and judgment normal.   Diabetic Foot Exam - Simple   Simple Foot Form Diabetic Foot exam was performed with the following findings:  Yes 08/07/2017  3:23 PM  Visual Inspection No deformities, no ulcerations, no other skin  breakdown bilaterally:  Yes Sensation Testing Intact to touch and monofilament testing bilaterally:  Yes Pulse Check Posterior Tibialis and Dorsalis pulse intact bilaterally:  Yes Comments    Assessment and Plan :  1. Essential hypertension Stable. Continue current medication.  2. Other depression Stable on current regimen.  3. Localized osteoporosis without current pathological fracture 4. Cough I think patient is getting better. Advised patient to take Mucinex DM, follow as needed. 5. Neuropathy Foot exam is normal today. Mild at this time. I think just follow this for now. 6.Breast cancer HPI, Exam and A&P transcribed by Tiffany Kocher, RMA under direction and in the presence of Miguel Aschoff, MD.

## 2017-08-07 NOTE — Patient Instructions (Signed)
Crystal Carpenter , Thank you for taking time to come for your Medicare Wellness Visit. I appreciate your ongoing commitment to your health goals. Please review the following plan we discussed and let me know if I can assist you in the future.   Screening recommendations/referrals: Colonoscopy: no longer required Mammogram: Up to date Bone Density: Up to date Recommended yearly ophthalmology/optometry visit for glaucoma screening and checkup Recommended yearly dental visit for hygiene and checkup  Vaccinations: Influenza vaccine: completed Pneumococcal vaccine: completed series Tdap vaccine: declined Shingles vaccine: declined   Advanced directives: Please bring a copy of your POA (Power of Attorney) and/or Living Will to your next appointment.   Conditions/risks identified: Fall risk prevention; Recommend increasing water intake to 4-6 glasses of water a day.   Next appointment: 2:24 PM today   Preventive Care 65 Years and Older, Female Preventive care refers to lifestyle choices and visits with your health care provider that can promote health and wellness. What does preventive care include?  A yearly physical exam. This is also called an annual well check.  Dental exams once or twice a year.  Routine eye exams. Ask your health care provider how often you should have your eyes checked.  Personal lifestyle choices, including:  Daily care of your teeth and gums.  Regular physical activity.  Eating a healthy diet.  Avoiding tobacco and drug use.  Limiting alcohol use.  Practicing safe sex.  Taking low-dose aspirin every day.  Taking vitamin and mineral supplements as recommended by your health care provider. What happens during an annual well check? The services and screenings done by your health care provider during your annual well check will depend on your age, overall health, lifestyle risk factors, and family history of disease. Counseling  Your health care provider  may ask you questions about your:  Alcohol use.  Tobacco use.  Drug use.  Emotional well-being.  Home and relationship well-being.  Sexual activity.  Eating habits.  History of falls.  Memory and ability to understand (cognition).  Work and work Statistician.  Reproductive health. Screening  You may have the following tests or measurements:  Height, weight, and BMI.  Blood pressure.  Lipid and cholesterol levels. These may be checked every 5 years, or more frequently if you are over 36 years old.  Skin check.  Lung cancer screening. You may have this screening every year starting at age 28 if you have a 30-pack-year history of smoking and currently smoke or have quit within the past 15 years.  Fecal occult blood test (FOBT) of the stool. You may have this test every year starting at age 22.  Flexible sigmoidoscopy or colonoscopy. You may have a sigmoidoscopy every 5 years or a colonoscopy every 10 years starting at age 21.  Hepatitis C blood test.  Hepatitis B blood test.  Sexually transmitted disease (STD) testing.  Diabetes screening. This is done by checking your blood sugar (glucose) after you have not eaten for a while (fasting). You may have this done every 1-3 years.  Bone density scan. This is done to screen for osteoporosis. You may have this done starting at age 64.  Mammogram. This may be done every 1-2 years. Talk to your health care provider about how often you should have regular mammograms. Talk with your health care provider about your test results, treatment options, and if necessary, the need for more tests. Vaccines  Your health care provider may recommend certain vaccines, such as:  Influenza vaccine. This  is recommended every year.  Tetanus, diphtheria, and acellular pertussis (Tdap, Td) vaccine. You may need a Td booster every 10 years.  Zoster vaccine. You may need this after age 96.  Pneumococcal 13-valent conjugate (PCV13) vaccine.  One dose is recommended after age 47.  Pneumococcal polysaccharide (PPSV23) vaccine. One dose is recommended after age 4. Talk to your health care provider about which screenings and vaccines you need and how often you need them. This information is not intended to replace advice given to you by your health care provider. Make sure you discuss any questions you have with your health care provider. Document Released: 10/28/2015 Document Revised: 06/20/2016 Document Reviewed: 08/02/2015 Elsevier Interactive Patient Education  2017 [place] Prevention in the Home Falls can cause injuries. They can happen to people of all ages. There are many things you can do to make your home safe and to help prevent falls. What can I do on the outside of my home?  Regularly fix the edges of walkways and driveways and fix any cracks.  Remove anything that might make you trip as you walk through a door, such as a raised step or threshold.  Trim any bushes or trees on the path to your home.  Use bright outdoor lighting.  Clear any walking paths of anything that might make someone trip, such as rocks or tools.  Regularly check to see if handrails are loose or broken. Make sure that both sides of any steps have handrails.  Any raised decks and porches should have guardrails on the edges.  Have any leaves, snow, or ice cleared regularly.  Use sand or salt on walking paths during winter.  Clean up any spills in your garage right away. This includes oil or grease spills. What can I do in the bathroom?  Use night lights.  Install grab bars by the toilet and in the tub and shower. Do not use towel bars as grab bars.  Use non-skid mats or decals in the tub or shower.  If you need to sit down in the shower, use a plastic, non-slip stool.  Keep the floor dry. Clean up any water that spills on the floor as soon as it happens.  Remove soap buildup in the tub or shower regularly.  Attach bath  mats securely with double-sided non-slip rug tape.  Do not have throw rugs and other things on the floor that can make you trip. What can I do in the bedroom?  Use night lights.  Make sure that you have a light by your bed that is easy to reach.  Do not use any sheets or blankets that are too big for your bed. They should not hang down onto the floor.  Have a firm chair that has side arms. You can use this for support while you get dressed.  Do not have throw rugs and other things on the floor that can make you trip. What can I do in the kitchen?  Clean up any spills right away.  Avoid walking on wet floors.  Keep items that you use a lot in easy-to-reach places.  If you need to reach something above you, use a strong step stool that has a grab bar.  Keep electrical cords out of the way.  Do not use floor polish or wax that makes floors slippery. If you must use wax, use non-skid floor wax.  Do not have throw rugs and other things on the floor that can make you  trip. What can I do with my stairs?  Do not leave any items on the stairs.  Make sure that there are handrails on both sides of the stairs and use them. Fix handrails that are broken or loose. Make sure that handrails are as long as the stairways.  Check any carpeting to make sure that it is firmly attached to the stairs. Fix any carpet that is loose or worn.  Avoid having throw rugs at the top or bottom of the stairs. If you do have throw rugs, attach them to the floor with carpet tape.  Make sure that you have a light switch at the top of the stairs and the bottom of the stairs. If you do not have them, ask someone to add them for you. What else can I do to help prevent falls?  Wear shoes that:  Do not have high heels.  Have rubber bottoms.  Are comfortable and fit you well.  Are closed at the toe. Do not wear sandals.  If you use a stepladder:  Make sure that it is fully opened. Do not climb a closed  stepladder.  Make sure that both sides of the stepladder are locked into place.  Ask someone to hold it for you, if possible.  Clearly mark and make sure that you can see:  Any grab bars or handrails.  First and last steps.  Where the edge of each step is.  Use tools that help you move around (mobility aids) if they are needed. These include:  Canes.  Walkers.  Scooters.  Crutches.  Turn on the lights when you go into a dark area. Replace any light bulbs as soon as they burn out.  Set up your furniture so you have a clear path. Avoid moving your furniture around.  If any of your floors are uneven, fix them.  If there are any pets around you, be aware of where they are.  Review your medicines with your doctor. Some medicines can make you feel dizzy. This can increase your chance of falling. Ask your doctor what other things that you can do to help prevent falls. This information is not intended to replace advice given to you by your health care provider. Make sure you discuss any questions you have with your health care provider. Document Released: 07/28/2009 Document Revised: 03/08/2016 Document Reviewed: 11/05/2014 Elsevier Interactive Patient Education  2017 [employer].

## 2017-08-12 ENCOUNTER — Other Ambulatory Visit: Payer: Self-pay | Admitting: Family Medicine

## 2017-09-20 DIAGNOSIS — I10 Essential (primary) hypertension: Secondary | ICD-10-CM | POA: Diagnosis not present

## 2017-09-20 DIAGNOSIS — R05 Cough: Secondary | ICD-10-CM | POA: Diagnosis not present

## 2017-09-21 LAB — CBC WITH DIFFERENTIAL/PLATELET
BASOS ABS: 22 {cells}/uL (ref 0–200)
Basophils Relative: 0.4 %
EOS ABS: 112 {cells}/uL (ref 15–500)
Eosinophils Relative: 2 %
HEMATOCRIT: 29.8 % — AB (ref 35.0–45.0)
HEMOGLOBIN: 9.6 g/dL — AB (ref 11.7–15.5)
LYMPHS ABS: 2744 {cells}/uL (ref 850–3900)
MCH: 27.7 pg (ref 27.0–33.0)
MCHC: 32.2 g/dL (ref 32.0–36.0)
MCV: 85.9 fL (ref 80.0–100.0)
MPV: 10.9 fL (ref 7.5–12.5)
Monocytes Relative: 11.4 %
NEUTROS ABS: 2083 {cells}/uL (ref 1500–7800)
Neutrophils Relative %: 37.2 %
Platelets: 105 10*3/uL — ABNORMAL LOW (ref 140–400)
RBC: 3.47 10*6/uL — ABNORMAL LOW (ref 3.80–5.10)
RDW: 20.7 % — ABNORMAL HIGH (ref 11.0–15.0)
Total Lymphocyte: 49 %
WBC mixed population: 638 cells/uL (ref 200–950)
WBC: 5.6 10*3/uL (ref 3.8–10.8)

## 2017-09-21 LAB — COMPLETE METABOLIC PANEL WITH GFR
AG Ratio: 2 (calc) (ref 1.0–2.5)
ALT: 13 U/L (ref 6–29)
AST: 16 U/L (ref 10–35)
Albumin: 4.2 g/dL (ref 3.6–5.1)
Alkaline phosphatase (APISO): 50 U/L (ref 33–130)
BUN: 17 mg/dL (ref 7–25)
CALCIUM: 9.7 mg/dL (ref 8.6–10.4)
CO2: 29 mmol/L (ref 20–32)
CREATININE: 0.65 mg/dL (ref 0.60–0.88)
Chloride: 100 mmol/L (ref 98–110)
GFR, EST AFRICAN AMERICAN: 90 mL/min/{1.73_m2} (ref >=60)
GFR, EST NON AFRICAN AMERICAN: 78 mL/min/{1.73_m2} (ref >=60)
Globulin: 2.1 g/dL (calc) (ref 1.9–3.7)
Glucose, Bld: 107 mg/dL — ABNORMAL HIGH (ref 65–99)
Potassium: 3.6 mmol/L (ref 3.5–5.3)
Sodium: 137 mmol/L (ref 135–146)
TOTAL PROTEIN: 6.3 g/dL (ref 6.1–8.1)
Total Bilirubin: 0.6 mg/dL (ref 0.2–1.2)

## 2017-09-21 LAB — TSH: TSH: 1.11 m[IU]/L (ref 0.40–4.50)

## 2017-09-25 ENCOUNTER — Telehealth: Payer: Self-pay

## 2017-09-25 NOTE — Telephone Encounter (Signed)
Patient advised. OV scheduled for 12/17

## 2017-09-25 NOTE — Telephone Encounter (Signed)
-----   Message from Jerrol Banana., MD sent at 09/24/2017  2:07 PM EST ----- Anemic--see me next week for plan for this.

## 2017-09-30 ENCOUNTER — Ambulatory Visit (INDEPENDENT_AMBULATORY_CARE_PROVIDER_SITE_OTHER): Payer: Medicare Other | Admitting: Family Medicine

## 2017-09-30 ENCOUNTER — Other Ambulatory Visit: Payer: Self-pay

## 2017-09-30 VITALS — BP 130/60 | HR 74 | Temp 98.0°F | Wt 144.0 lb

## 2017-09-30 DIAGNOSIS — D649 Anemia, unspecified: Secondary | ICD-10-CM | POA: Diagnosis not present

## 2017-09-30 DIAGNOSIS — H6690 Otitis media, unspecified, unspecified ear: Secondary | ICD-10-CM | POA: Diagnosis not present

## 2017-09-30 MED ORDER — DOXYCYCLINE HYCLATE 100 MG PO TABS: 100.0000 mg | ORAL_TABLET | Freq: Two times a day (BID) | ORAL | 0 refills | Status: DC

## 2017-09-30 NOTE — Patient Instructions (Signed)
Collect stool specimen and bring to office

## 2017-09-30 NOTE — Progress Notes (Signed)
Crystal Carpenter  MRN: [id number] DOB: 1924-09-22  Subjective:  HPI   The patient is a 83 year old female who presents for follow up and treatment plan for anemia.  She had her labs done last on 09/20/17 and Her CBC revealed the following. CBC Latest Ref Rng & Units 09/20/2017 01/23/2017 09/20/2016  WBC 3.8 - 10.8 Thousand/uL 5.6 6.2 7.4  Hemoglobin 11.7 - 15.5 g/dL 9.6(L) 11.8 12.0  Hematocrit 35.0 - 45.0 % 29.8(L) 37.2 38.1  Platelets 140 - 400 Thousand/uL 105(L) 97(LL) 106(L)   The patient states she has noticed being more tired than normal and gives out more easily.  The patient is also complaining of possible sinusitis.  She has had stopped up ears, headache, possible low grade fever in the afternoons and blowing out yellow mucus with some blood.  She has started on Mucinex.   Patient Active Problem List   Diagnosis Date Noted  . Malignant neoplasm of upper-outer quadrant of left breast in female, estrogen receptor positive ([place]) 05/22/2017  . Allergic rhinitis 08/25/2015  . Arthropathia 08/25/2015  . Clinical depression 08/25/2015  . Urinary system disease 08/25/2015  . Calcium blood increased 08/25/2015  . Lumbar canal stenosis 06/08/2014  . Personal history of malignant neoplasm of breast 06/30/2013  . Malignant neoplasm of breast (female) ([place]) 05/25/2013  . Cancer ([place])   . Breast cancer ([place]) 04/09/2013  . Acid reflux 02/24/2010  . Hypercholesterolemia 02/24/2010  . Avitaminosis D 02/28/2009  . BP (high blood pressure) 07/01/2007    Past Medical History:  Diagnosis Date  . Cancer [facility]) July 2014   T2,N2a, Modified radical mastectomy. 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 Hypertension   . Malignant neoplasm of breast (female), unspecified site July 2014   Her case was presented at the [facility] tumor board in July 2014. Recommendations were for chest wall/peripheral lymphatic radiation if metastatic disease was not identified, anti-estrogen therapy  only if additional metastatic disease was detected on a PET CT. No plans for adjuvant chemotherapy if metastatic disease was identified was recommended.  . Sciatic pain 2015    Social History   Socioeconomic History  . Marital status: Married    Spouse name: Not on file  . Number of children: Not on file  . Years of education: Not on file  . Highest education level: Not on file  Social Needs  . Financial resource strain: Not on file  . Food insecurity - worry: Not on file  . Food insecurity - inability: Not on file  . Transportation needs - medical: Not on file  . Transportation needs - non-medical: Not on file  Occupational History  . Not on file  Tobacco Use  . Smoking status: Never Smoker  . Smokeless tobacco: Never Used  Substance and Sexual Activity  . Alcohol use: No  . Drug use: No  . Sexual activity: Not on file  Other Topics Concern  . Not on file  Social History Narrative  . Not on file    Outpatient Encounter Medications as of 09/30/2017  Medication Sig Note  . acetaminophen (TYLENOL) 325 MG tablet Take 650 mg by mouth every 6 (six) hours as needed.   [name] alendronate (FOSAMAX) 70 MG tablet TAKE 1 TABLET BY MOUTH EVERY 7 DAYS. TAKE WITH A FULL GLASS OF WATER ON AN EMPTY STOMACH.   [name] b complex vitamins tablet Take 1 tablet by mouth daily.   . famotidine (PEPCID) 10 MG tablet Take 10 mg  by mouth daily as needed for heartburn or indigestion.   . hydrochlorothiazide (HYDRODIURIL) 25 MG tablet TAKE 1 TABLET BY MOUTH EVERY DAY   . ibuprofen (ADVIL,MOTRIN) 100 MG tablet Take 100 mg by mouth every 6 (six) hours as needed for fever.   [name] letrozole (FEMARA) 2.5 MG tablet Take 1 tablet (2.5 mg total) by mouth daily.   . naproxen sodium (ALEVE) 220 MG tablet Take by mouth. 08/25/2015: Medication taken as needed.  Received from: [employer]  . potassium chloride (MICRO-K) 10 MEQ CR capsule TAKE 1 CAPSULE BY MOUTH EVERY DAY.   . Saline 0.2 % SOLN  08/25/2015:  Received from: [employer]  . sertraline (ZOLOFT) 100 MG tablet TAKE 1 TABLET BY MOUTH EVERY DAY    No facility-administered encounter medications on file as of 09/30/2017.     Allergies  Allergen Reactions  . Tape Rash  . Gabapentin     Headache, "felt weird in the head"  . Sulfa Antibiotics   . Shellfish Allergy Rash    Review of Systems  Constitutional: Positive for chills, fever and malaise/fatigue.  HENT: Positive for ear pain, hearing loss (patient uses hearing aids but does not have them in today) and sinus pain. Negative for ear discharge, nosebleeds, sore throat and tinnitus.   Eyes: Negative.   Respiratory: Positive for cough and sputum production. Negative for shortness of breath and wheezing.   Cardiovascular: Negative for chest pain, palpitations, orthopnea, claudication and leg swelling.  Gastrointestinal: Negative.   Neurological: Positive for weakness.  Endo/Heme/Allergies: Negative.   Psychiatric/Behavioral: Negative.     Objective:  BP 130/60 (BP Location: Right Arm, Patient Position: Sitting, Cuff Size: Normal)   Pulse 74   Temp 98 F (36.7 C) (Oral)   Wt 144 lb (65.3 kg)   SpO2 98%   BMI 26.34 kg/m   Physical Exam  Constitutional: She is oriented to person, place, and time and well-developed, well-nourished, and in no distress.  HENT:  Head: Normocephalic and atraumatic.  Right Ear: External ear normal.  Left Ear: External ear normal.  Nose: Nose normal.  Mouth/Throat: Oropharynx is clear and moist.  Erythematous left TM. Conjunctiva mildly pale.  Eyes: Conjunctivae are normal. Pupils are equal, round, and reactive to light.  Neck: Normal range of motion. No thyromegaly present.  Cardiovascular: Normal rate, regular rhythm and normal heart sounds.  Pulmonary/Chest: Effort normal and breath sounds normal.  Abdominal: Soft.  Lymphadenopathy:    She has no cervical adenopathy.  Neurological: She is alert and oriented to person,  place, and time. Gait normal. GCS score is 15.  Skin: Skin is warm and dry.  Psychiatric: Mood, memory, affect and judgment normal.    Assessment and Plan :    1. Anemia, unspecified type May need hemotology referral if no response to therapy. - CBC with Differential/Platelet - Iron - B12 and Folate Panel  2. Acute otitis media, unspecified otitis media type  - doxycycline (VIBRA-TABS) 100 MG tablet; Take 1 tablet (100 mg total) by mouth 2 (two) times daily.  Dispense: 20 tablet; Refill: 0  I have done the exam and reviewed the chart and it is accurate to the best of my knowledge. Development worker, community has been used and  any errors in dictation or transcription are unintentional. Miguel Aschoff M.D. [facility]

## 2017-10-01 LAB — IFOBT (OCCULT BLOOD): IFOBT: NEGATIVE

## 2017-10-04 ENCOUNTER — Telehealth: Payer: Self-pay

## 2017-10-04 NOTE — Telephone Encounter (Signed)
Patient was seen on 09/30/17 we ordered lab work to re check her CBC, also to check Iron and b12 level. Patient states she was no told to get labs done, I apologized to patient about this confusion. Patient was advised to come by on 10/04/17 or 10/07/17 if possible to check this. She did bring stool sample by and Oclyte was checked and it was negative. Patient wants to know if she should be taking something to help her anemia while waiting on blood work to be done?-Anastasiya [employer], RMA

## 2017-10-09 NOTE — Telephone Encounter (Signed)
Pt advised-Anastasiya V Hopkins, RMA  

## 2017-10-09 NOTE — Telephone Encounter (Signed)
Not until blood done.

## 2017-10-10 ENCOUNTER — Ambulatory Visit (INDEPENDENT_AMBULATORY_CARE_PROVIDER_SITE_OTHER): Payer: Medicare Other | Admitting: Family Medicine

## 2017-10-10 VITALS — BP 128/62 | HR 72 | Temp 97.7°F | Resp 16 | Wt 142.0 lb

## 2017-10-10 DIAGNOSIS — Z170 Estrogen receptor positive status [ER+]: Secondary | ICD-10-CM

## 2017-10-10 DIAGNOSIS — C50412 Malignant neoplasm of upper-outer quadrant of left female breast: Secondary | ICD-10-CM

## 2017-10-10 DIAGNOSIS — D649 Anemia, unspecified: Secondary | ICD-10-CM | POA: Diagnosis not present

## 2017-10-10 DIAGNOSIS — C50919 Malignant neoplasm of unspecified site of unspecified female breast: Secondary | ICD-10-CM | POA: Diagnosis not present

## 2017-10-10 DIAGNOSIS — K219 Gastro-esophageal reflux disease without esophagitis: Secondary | ICD-10-CM

## 2017-10-10 DIAGNOSIS — R7989 Other specified abnormal findings of blood chemistry: Secondary | ICD-10-CM | POA: Diagnosis not present

## 2017-10-10 DIAGNOSIS — I10 Essential (primary) hypertension: Secondary | ICD-10-CM

## 2017-10-10 NOTE — Progress Notes (Signed)
CHARNETTE YOUNKIN  MRN: [id number] DOB: 1923/11/14  Subjective:  HPI  Patient is here to re check her left ear. Patient was seen on 09/30/17 and was advised she had ear infection and was started on Doxy. Patient has 1 tablet left of that medication to take. Patient stats she is not having any pain in the ear but it does feel stopped up. Sunday 10/06/17 she could not hear anything from her left ear, that has gotten better some now. She is having cough, head congestion, drainage also and thinks maybe she is having sinus issue that is contributing to the ear issue. No fever.  Patient Active Problem List   Diagnosis Date Noted  . Malignant neoplasm of upper-outer quadrant of left breast in female, estrogen receptor positive ([place]) 05/22/2017  . Allergic rhinitis 08/25/2015  . Arthropathia 08/25/2015  . Clinical depression 08/25/2015  . Urinary system disease 08/25/2015  . Calcium blood increased 08/25/2015  . Lumbar canal stenosis 06/08/2014  . Personal history of malignant neoplasm of breast 06/30/2013  . Malignant neoplasm of breast (female) ([place]) 05/25/2013  . Cancer ([place])   . Breast cancer ([place]) 04/09/2013  . Acid reflux 02/24/2010  . Hypercholesterolemia 02/24/2010  . Avitaminosis D 02/28/2009  . BP (high blood pressure) 07/01/2007    Past Medical History:  Diagnosis Date  . Cancer [facility]) July 2014   T2,N2a, Modified radical mastectomy. 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 Hypertension   . Malignant neoplasm of breast (female), unspecified site July 2014   Her case was presented at the [facility] tumor board in July 2014. Recommendations were for chest wall/peripheral lymphatic radiation if metastatic disease was not identified, anti-estrogen therapy only if additional metastatic disease was detected on a PET CT. No plans for adjuvant chemotherapy if metastatic disease was identified was recommended.  . Sciatic pain 2015    Social History   Socioeconomic  History  . Marital status: Married    Spouse name: Not on file  . Number of children: Not on file  . Years of education: Not on file  . Highest education level: Not on file  Social Needs  . Financial resource strain: Not on file  . Food insecurity - worry: Not on file  . Food insecurity - inability: Not on file  . Transportation needs - medical: Not on file  . Transportation needs - non-medical: Not on file  Occupational History  . Not on file  Tobacco Use  . Smoking status: Never Smoker  . Smokeless tobacco: Never Used  Substance and Sexual Activity  . Alcohol use: No  . Drug use: No  . Sexual activity: Not on file  Other Topics Concern  . Not on file  Social History Narrative  . Not on file    Outpatient Encounter Medications as of 10/10/2017  Medication Sig Note  . acetaminophen (TYLENOL) 325 MG tablet Take 650 mg by mouth every 6 (six) hours as needed.   [name] alendronate (FOSAMAX) 70 MG tablet TAKE 1 TABLET BY MOUTH EVERY 7 DAYS. TAKE WITH A FULL GLASS OF WATER ON AN EMPTY STOMACH.   [name] b complex vitamins tablet Take 1 tablet by mouth daily.   [name] doxycycline (VIBRA-TABS) 100 MG tablet Take 1 tablet (100 mg total) by mouth 2 (two) times daily.   . famotidine (PEPCID) 10 MG tablet Take 10 mg by mouth daily as needed for heartburn or indigestion.   . hydrochlorothiazide (HYDRODIURIL) 25 MG tablet TAKE 1 TABLET BY  MOUTH EVERY DAY   . ibuprofen (ADVIL,MOTRIN) 100 MG tablet Take 100 mg by mouth every 6 (six) hours as needed for fever.   [name] letrozole (FEMARA) 2.5 MG tablet Take 1 tablet (2.5 mg total) by mouth daily.   . naproxen sodium (ALEVE) 220 MG tablet Take by mouth. 08/25/2015: Medication taken as needed.  Received from: [facility]  . potassium chloride (MICRO-K) 10 MEQ CR capsule TAKE 1 CAPSULE BY MOUTH EVERY DAY.   . Saline 0.2 % SOLN  08/25/2015: Received from: [facility]  . sertraline (ZOLOFT) 100 MG tablet TAKE 1 TABLET BY MOUTH EVERY  DAY    No facility-administered encounter medications on file as of 10/10/2017.     Allergies  Allergen Reactions  . Tape Rash  . Gabapentin     Headache, "felt weird in the head"  . Sulfa Antibiotics   . Shellfish Allergy Rash    Review of Systems  Constitutional: Positive for malaise/fatigue.  HENT: Positive for congestion and hearing loss. Negative for tinnitus.   Eyes: Negative.   Respiratory: Positive for cough and shortness of breath.   Cardiovascular: Negative.   Gastrointestinal: Negative.   Musculoskeletal: Positive for back pain and joint pain.       Stiffness  Neurological: Negative.   Endo/Heme/Allergies: Negative.   Psychiatric/Behavioral: Negative.     Objective:  BP 128/62   Pulse 72   Temp 97.7 F (36.5 C)   Resp 16   Wt 142 lb (64.4 kg)   SpO2 98%   BMI 25.97 kg/m   Physical Exam  Constitutional: She is oriented to person, place, and time and well-developed, well-nourished, and in no distress.  HENT:  Head: Normocephalic and atraumatic.  Right Ear: External ear normal.  Left Ear: External ear normal.  Nose: Nose normal.  Mouth/Throat: Oropharynx is clear and moist.  Eyes: Conjunctivae are normal. No scleral icterus.  Neck: No thyromegaly present.  Cardiovascular: Normal rate, regular rhythm and normal heart sounds.  Pulmonary/Chest: Effort normal and breath sounds normal.  Abdominal: Soft.  Neurological: She is alert and oriented to person, place, and time. Gait normal. GCS score is 15.  Skin: Skin is warm and dry.  Psychiatric: Mood, memory, affect and judgment normal.    Assessment and Plan :   URI/OM Resolving. Anemia May need hematology referral. Breast Cancer MDD GERD HTN  I have done the exam and reviewed the chart and it is accurate to the best of my knowledge. Development worker, community has been used and  any errors in dictation or transcription are unintentional. Miguel Aschoff M.D. [facility]

## 2017-10-11 LAB — IRON: IRON: 103 ug/dL (ref 45–160)

## 2017-10-11 LAB — CBC WITH DIFFERENTIAL/PLATELET
BASOS PCT: 0.7 %
Basophils Absolute: 53 cells/uL (ref 0–200)
EOS ABS: 91 {cells}/uL (ref 15–500)
Eosinophils Relative: 1.2 %
HEMATOCRIT: 29.3 % — AB (ref 35.0–45.0)
Hemoglobin: 9.4 g/dL — ABNORMAL LOW (ref 11.7–15.5)
LYMPHS ABS: 3549 {cells}/uL (ref 850–3900)
MCH: 27.6 pg (ref 27.0–33.0)
MCHC: 32.1 g/dL (ref 32.0–36.0)
MCV: 85.9 fL (ref 80.0–100.0)
MPV: 10.1 fL (ref 7.5–12.5)
Monocytes Relative: 15.1 %
NEUTROS ABS: 2759 {cells}/uL (ref 1500–7800)
Neutrophils Relative %: 36.3 %
Platelets: 147 10*3/uL (ref 140–400)
RBC: 3.41 10*6/uL — AB (ref 3.80–5.10)
RDW: 20.6 % — ABNORMAL HIGH (ref 11.0–15.0)
Total Lymphocyte: 46.7 %
WBC: 7.6 10*3/uL (ref 3.8–10.8)
WBCMIX: 1148 {cells}/uL — AB (ref 200–950)

## 2017-10-11 LAB — B12 AND FOLATE PANEL: Vitamin B-12: 1905 pg/mL — ABNORMAL HIGH (ref 200–1100)

## 2017-10-16 ENCOUNTER — Telehealth: Payer: Self-pay

## 2017-10-16 DIAGNOSIS — D638 Anemia in other chronic diseases classified elsewhere: Secondary | ICD-10-CM

## 2017-10-16 NOTE — Telephone Encounter (Signed)
Pt advised and order placed-[name] V [name], RMA

## 2017-10-16 NOTE — Telephone Encounter (Signed)
-----   Message from Jerrol Banana., MD sent at 10/16/2017  9:01 AM EST ----- Likely anemia of chronic disease--would refer to hematology.

## 2017-10-18 DIAGNOSIS — H65112 Acute and subacute allergic otitis media (mucoid) (sanguinous) (serous), left ear: Secondary | ICD-10-CM | POA: Diagnosis not present

## 2017-10-18 DIAGNOSIS — H6982 Other specified disorders of Eustachian tube, left ear: Secondary | ICD-10-CM | POA: Diagnosis not present

## 2017-10-21 ENCOUNTER — Inpatient Hospital Stay: Payer: Medicare Other | Attending: Oncology | Admitting: Oncology

## 2017-10-21 ENCOUNTER — Other Ambulatory Visit: Payer: Self-pay

## 2017-10-21 ENCOUNTER — Inpatient Hospital Stay: Payer: Medicare Other

## 2017-10-21 ENCOUNTER — Encounter: Payer: Self-pay | Admitting: Oncology

## 2017-10-21 VITALS — BP 140/69 | HR 85 | Temp 98.3°F | Resp 18 | Ht 61.02 in | Wt 139.2 lb

## 2017-10-21 DIAGNOSIS — I10 Essential (primary) hypertension: Secondary | ICD-10-CM | POA: Insufficient documentation

## 2017-10-21 DIAGNOSIS — Z853 Personal history of malignant neoplasm of breast: Secondary | ICD-10-CM | POA: Insufficient documentation

## 2017-10-21 DIAGNOSIS — E785 Hyperlipidemia, unspecified: Secondary | ICD-10-CM | POA: Insufficient documentation

## 2017-10-21 DIAGNOSIS — D649 Anemia, unspecified: Secondary | ICD-10-CM | POA: Diagnosis not present

## 2017-10-21 DIAGNOSIS — Z9012 Acquired absence of left breast and nipple: Secondary | ICD-10-CM | POA: Insufficient documentation

## 2017-10-21 LAB — CBC WITH DIFFERENTIAL/PLATELET
BLASTS: 0 %
Band Neutrophils: 0 %
Basophils Absolute: 0.1 10*3/uL (ref 0–0.1)
Basophils Relative: 1 %
Eosinophils Absolute: 0 10*3/uL (ref 0–0.7)
Eosinophils Relative: 0 %
HEMATOCRIT: 31.9 % — AB (ref 35.0–47.0)
HEMOGLOBIN: 10.1 g/dL — AB (ref 12.0–16.0)
LYMPHS PCT: 43 %
Lymphs Abs: 4.6 10*3/uL — ABNORMAL HIGH (ref 1.0–3.6)
MCH: 28.3 pg (ref 26.0–34.0)
MCHC: 31.8 g/dL — AB (ref 32.0–36.0)
MCV: 88.9 fL (ref 80.0–100.0)
MYELOCYTES: 1 %
Metamyelocytes Relative: 0 %
Monocytes Absolute: 1.7 10*3/uL — ABNORMAL HIGH (ref 0.2–0.9)
Monocytes Relative: 16 %
NEUTROS PCT: 39 %
NRBC: 1 /100{WBCs} — AB
Neutro Abs: 4.2 10*3/uL (ref 1.4–6.5)
OTHER: 0 %
PROMYELOCYTES ABS: 0 %
Platelets: 210 10*3/uL (ref 150–440)
RBC: 3.59 MIL/uL — AB (ref 3.80–5.20)
RDW: 22.5 % — AB (ref 11.5–14.5)
WBC: 10.6 10*3/uL (ref 3.6–11.0)

## 2017-10-21 LAB — RETICULOCYTES
RBC.: 3.66 MIL/uL — ABNORMAL LOW (ref 3.80–5.20)
RETIC COUNT ABSOLUTE: 102.5 10*3/uL (ref 19.0–183.0)
Retic Ct Pct: 2.8 % (ref 0.4–3.1)

## 2017-10-21 LAB — FERRITIN: Ferritin: 256 ng/mL (ref 11–307)

## 2017-10-21 LAB — IRON AND TIBC
Iron: 105 ug/dL (ref 28–170)
SATURATION RATIOS: 31 % (ref 10.4–31.8)
TIBC: 337 ug/dL (ref 250–450)
UIBC: 232 ug/dL

## 2017-10-21 NOTE — Progress Notes (Addendum)
Hematology/Oncology Consult note [facility] [phone] [phone]  Patient Care Team: Jerrol Banana., MD as PCP - General (Family Medicine) Bary Castilla, Forest Gleason, MD as Consulting Physician (General Surgery) Rayetta Humphrey as Consulting Physician (Optometry)   Name of the patient: Crystal Carpenter  [id number]  Dec 24, 1923    Reason for referral- anemia   Referring physician- Dr. Rosanna Randy  Date of visit: 10/21/17   History of presenting illness-patient is a 84 year old female with a past medical history significant for breast cancer, hypercholesterolemia and hypertension among other medical problems.  She has been referred to [place] for anemia.  Recent CBC from 09/20/2017 showed white count of 5.6, H&H of 9.6/29.8 and a platelet count of 105.I TSH was normal at 1.11.  CMP was within normal limits.  Serum iron was normal at 103.  B12 levels were elevated at 1905 and folate was normal at greater than 24.  Stool FOBT was negative.  Patient lives with her 86 year old husband and is independent of her ADLs and IADLs.  She is still driving.  She does feel fatigued on and off but today she reports feeling well.  She denies any blood in her stool or urine.  The  ECOG PS- 1  Pain scale- 0   Review of systems- Review of Systems  Constitutional: Negative for chills, fever, malaise/fatigue and weight loss.  HENT: Negative for congestion, ear discharge and nosebleeds.   Eyes: Negative for blurred vision.  Respiratory: Negative for cough, hemoptysis, sputum production, shortness of breath and wheezing.   Cardiovascular: Negative for chest pain, palpitations, orthopnea and claudication.  Gastrointestinal: Negative for abdominal pain, blood in stool, constipation, diarrhea, heartburn, melena, nausea and vomiting.  Genitourinary: Negative for dysuria, flank pain, frequency, hematuria and urgency.  Musculoskeletal: Negative for back pain, joint pain and  myalgias.  Skin: Negative for rash.  Neurological: Negative for dizziness, tingling, focal weakness, seizures, weakness and headaches.  Endo/Heme/Allergies: Does not bruise/bleed easily.  Psychiatric/Behavioral: Negative for depression and suicidal ideas. The patient does not have insomnia.     Allergies  Allergen Reactions  . Tape Rash  . Gabapentin     Headache, "felt weird in the head"  . Sulfa Antibiotics   . Shellfish Allergy Rash    Patient Active Problem List   Diagnosis Date Noted  . Malignant neoplasm of upper-outer quadrant of left breast in female, estrogen receptor positive ([place]) 05/22/2017  . Allergic rhinitis 08/25/2015  . Arthropathia 08/25/2015  . Clinical depression 08/25/2015  . Urinary system disease 08/25/2015  . Calcium blood increased 08/25/2015  . Lumbar canal stenosis 06/08/2014  . Personal history of malignant neoplasm of breast 06/30/2013  . Malignant neoplasm of breast (female) ([place]) 05/25/2013  . Cancer ([place])   . Breast cancer ([place]) 04/09/2013  . Acid reflux 02/24/2010  . Hypercholesterolemia 02/24/2010  . Avitaminosis D 02/28/2009  . BP (high blood pressure) 07/01/2007     Past Medical History:  Diagnosis Date  . Cancer [facility]) July 2014   T2,N2a, Modified radical mastectomy. 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 Hypertension   . Malignant neoplasm of breast (female), unspecified site July 2014   Her case was presented at the [facility] tumor board in July 2014. Recommendations were for chest wall/peripheral lymphatic radiation if metastatic disease was not identified, anti-estrogen therapy only if additional metastatic disease was detected on a PET CT. No plans for adjuvant chemotherapy if metastatic disease was identified was recommended.  . Sciatic  pain 2015     Past Surgical History:  Procedure Laterality Date  . ABDOMINAL HYSTERECTOMY    . BREAST SURGERY Left 04-28-13   left mastectomy, SN biopsy  . EPIDURAL BLOCK  INJECTION  2015   x3    Social History   Socioeconomic History  . Marital status: Married    Spouse name: Not on file  . Number of children: Not on file  . Years of education: Not on file  . Highest education level: Not on file  Social Needs  . Financial resource strain: Not on file  . Food insecurity - worry: Not on file  . Food insecurity - inability: Not on file  . Transportation needs - medical: Not on file  . Transportation needs - non-medical: Not on file  Occupational History  . Not on file  Tobacco Use  . Smoking status: Never Smoker  . Smokeless tobacco: Never Used  Substance and Sexual Activity  . Alcohol use: No  . Drug use: No  . Sexual activity: Not on file  Other Topics Concern  . Not on file  Social History Narrative  . Not on file     Family History  Problem Relation Age of Onset  . Stroke Mother   . Heart disease Father   . Stroke Father   . Parkinson's disease Sister   . Parkinson's disease Brother      Current Outpatient Medications:  .  alendronate (FOSAMAX) 70 MG tablet, TAKE 1 TABLET BY MOUTH EVERY 7 DAYS. TAKE WITH A FULL GLASS OF WATER ON AN EMPTY STOMACH., Disp: 4 tablet, Rfl: 11 .  b complex vitamins tablet, Take 1 tablet by mouth daily., Disp: , Rfl:  .  docusate sodium (COLACE) 100 MG capsule, Take 100 mg by mouth daily., Disp: , Rfl:  .  fluticasone (FLONASE) 50 MCG/ACT nasal spray, , Disp: , Rfl:  .  hydrochlorothiazide (HYDRODIURIL) 25 MG tablet, TAKE 1 TABLET BY MOUTH EVERY DAY, Disp: 90 tablet, Rfl: 3 .  letrozole (FEMARA) 2.5 MG tablet, Take 1 tablet (2.5 mg total) by mouth daily., Disp: 90 tablet, Rfl: 3 .  potassium chloride (MICRO-K) 10 MEQ CR capsule, TAKE 1 CAPSULE BY MOUTH EVERY DAY., Disp: 90 capsule, Rfl: 3 .  Saline 0.2 % SOLN, , Disp: , Rfl:  .  sertraline (ZOLOFT) 100 MG tablet, TAKE 1 TABLET BY MOUTH EVERY DAY, Disp: 30 tablet, Rfl: 11 .  acetaminophen (TYLENOL) 325 MG tablet, Take 650 mg by mouth every 6 (six) hours  as needed., Disp: , Rfl:  .  cefdinir (OMNICEF) 300 MG capsule, , Disp: , Rfl:  .  famotidine (PEPCID) 10 MG tablet, Take 10 mg by mouth daily as needed for heartburn or indigestion., Disp: , Rfl:  .  ibuprofen (ADVIL,MOTRIN) 100 MG tablet, Take 100 mg by mouth every 6 (six) hours as needed for fever., Disp: , Rfl:  .  methylPREDNISolone (MEDROL DOSEPAK) 4 MG TBPK tablet, , Disp: , Rfl:  .  naproxen sodium (ALEVE) 220 MG tablet, Take by mouth., Disp: , Rfl:    Physical exam:  Vitals:   10/21/17 1429  BP: 140/69  Pulse: 85  Resp: 18  Temp: 98.3 F (36.8 C)  TempSrc: Tympanic  Weight: 139 lb 3.5 oz (63.2 kg)  Height: 5' 1.02" (1.55 m)   Physical Exam  Constitutional: She is oriented to person, place, and time.  Elderly frail woman in no acute distress  HENT:  Head: Normocephalic and atraumatic.  Eyes: EOM  are normal. Pupils are equal, round, and reactive to light.  Neck: Normal range of motion.  Cardiovascular: Normal rate, regular rhythm and normal heart sounds.  Pulmonary/Chest: Effort normal and breath sounds normal.  Abdominal: Soft. Bowel sounds are normal.  Neurological: She is alert and oriented to person, place, and time.  Skin: Skin is warm and dry.       CMP Latest Ref Rng & Units 09/20/2017  Glucose 65 - 99 mg/dL 107(H)  BUN 7 - 25 mg/dL 17  Creatinine 0.60 - 0.88 mg/dL 0.65  Sodium 135 - 146 mmol/L 137  Potassium 3.5 - 5.3 mmol/L 3.6  Chloride 98 - 110 mmol/L 100  CO2 20 - 32 mmol/L 29  Calcium 8.6 - 10.4 mg/dL 9.7  Total Protein 6.1 - 8.1 g/dL 6.3  Total Bilirubin 0.2 - 1.2 mg/dL 0.6  Alkaline Phos 39 - 117 IU/L -  AST 10 - 35 U/L 16  ALT 6 - 29 U/L 13   CBC Latest Ref Rng & Units 10/10/2017  WBC 3.8 - 10.8 Thousand/uL 7.6  Hemoglobin 11.7 - 15.5 g/dL 9.4(L)  Hematocrit 35.0 - 45.0 % 29.3(L)  Platelets 140 - 400 Thousand/uL 147     Assessment and plan- Patient is a 84 y.o. female referred for normocytic anemia  Anemia workup so far done by Dr.  Rosanna Randy reveals a normal TSH and normal serum iron as well as normal B12 and folate levels.  Today I will repeat a CBC with differential along with ferritin and iron studies along with reticulocyte count haptoglobin and myeloma panel.  I will see the patient back in 2 weeks time to discuss the results of her blood work.  If peripheral blood work does not reveal any obvious cause of anemia-possible etiologies are anemia of chronic disease versus MDS given her age (she also had mild thrombocytopenia in the past which seems to have resolved at this time).  I would not recommend doing a bone marrow biopsy at this time as long as her anemia is stable between 9-10 and she is not close to transfusion requirements.  Even of bone marrow biopsy reveals MDS given her age and frailty treatment options may be limited and should be considered only if she has severe cytopenias   Thank you for this kind referral and the opportunity to participate in the care of this  Patient   Visit Diagnosis 1. Normocytic anemia     Dr. Randa Evens, MD, MPH [place] at [facility] Pager- [phone] 10/21/2017

## 2017-10-21 NOTE — Progress Notes (Signed)
Here for few pt evaluation

## 2017-10-22 ENCOUNTER — Encounter: Payer: Self-pay | Admitting: Oncology

## 2017-10-22 LAB — MULTIPLE MYELOMA PANEL, SERUM
ALBUMIN/GLOB SERPL: 1.4 (ref 0.7–1.7)
ALPHA 1: 0.3 g/dL (ref 0.0–0.4)
ALPHA2 GLOB SERPL ELPH-MCNC: 0.9 g/dL (ref 0.4–1.0)
Albumin SerPl Elph-Mcnc: 3.9 g/dL (ref 2.9–4.4)
B-GLOBULIN SERPL ELPH-MCNC: 1.1 g/dL (ref 0.7–1.3)
GAMMA GLOB SERPL ELPH-MCNC: 0.7 g/dL (ref 0.4–1.8)
GLOBULIN, TOTAL: 3 g/dL (ref 2.2–3.9)
IGG (IMMUNOGLOBIN G), SERUM: 657 mg/dL — AB (ref 700–1600)
IgA: 249 mg/dL (ref 64–422)
IgM (Immunoglobulin M), Srm: 53 mg/dL (ref 26–217)
Total Protein ELP: 6.9 g/dL (ref 6.0–8.5)

## 2017-10-22 LAB — HAPTOGLOBIN: Haptoglobin: 177 mg/dL (ref 34–200)

## 2017-11-04 ENCOUNTER — Encounter: Payer: Self-pay | Admitting: Oncology

## 2017-11-04 ENCOUNTER — Inpatient Hospital Stay (HOSPITAL_BASED_OUTPATIENT_CLINIC_OR_DEPARTMENT_OTHER): Payer: Medicare Other | Admitting: Oncology

## 2017-11-04 VITALS — BP 130/71 | HR 77 | Temp 97.8°F | Resp 18 | Wt 141.1 lb

## 2017-11-04 DIAGNOSIS — D649 Anemia, unspecified: Secondary | ICD-10-CM

## 2017-11-04 DIAGNOSIS — Z853 Personal history of malignant neoplasm of breast: Secondary | ICD-10-CM | POA: Diagnosis not present

## 2017-11-04 DIAGNOSIS — E785 Hyperlipidemia, unspecified: Secondary | ICD-10-CM | POA: Diagnosis not present

## 2017-11-04 DIAGNOSIS — Z9012 Acquired absence of left breast and nipple: Secondary | ICD-10-CM | POA: Diagnosis not present

## 2017-11-04 DIAGNOSIS — I10 Essential (primary) hypertension: Secondary | ICD-10-CM | POA: Diagnosis not present

## 2017-11-04 NOTE — Progress Notes (Signed)
Hematology/Oncology Consult note [facility]  [phone] Fax:([phone]  Patient Care Team: Jerrol Banana., MD as PCP - General (Family Medicine) Bary Castilla, Forest Gleason, MD as Consulting Physician (General Surgery) Rayetta Humphrey as Consulting Physician (Optometry)   Name of the patient: Maniyah Moller  [id number]  Dec 04, 1923   Date of visit: 11/04/17  Diagnosis- anemia likely due to chronic disease versus MDS  Chief complaint/ Reason for visit- discuss results of bloodwork  Heme/Onc history: patient is a 84 year old female with a past medical history significant for breast cancer, hypercholesterolemia and hypertension among other medical problems.  She has been referred to [place] for anemia.  Recent CBC from 09/20/2017 showed white count of 5.6, H&H of 9.6/29.8 and a platelet count of 105. TSH was normal at 1.11.  CMP was within normal limits.  Serum iron was normal at 103.  B12 levels were elevated at 1905 and folate was normal at greater than 24.  Stool FOBT was negative.  Patient lives with her 60 year old husband and is independent of her ADLs and IADLs.  She is still driving.  She does feel fatigued on and off but today she reports feeling well.  She denies any blood in her stool or urine.   Results of blood work from 10/21/2017 were as follows: CBC showed white count of 10.6, H&H of 10.1/31.9 with a platelet count of 210.  Iron studies, haptoglobin and reticulocyte count was normal.  Multiple myeloma panel showed no monoclonal protein and IFE was normal  Interval history- she still feels fatigued but overall improved. Appetite is better. She is recovering from a recent ear infection  ECOG PS- 1 Pain scale- 0   Review of systems- Review of Systems  Constitutional: Negative for chills, fever, malaise/fatigue and weight loss.  HENT: Negative for congestion, ear discharge and nosebleeds.   Eyes: Negative for blurred vision.    Respiratory: Negative for cough, hemoptysis, sputum production, shortness of breath and wheezing.   Cardiovascular: Negative for chest pain, palpitations, orthopnea and claudication.  Gastrointestinal: Negative for abdominal pain, blood in stool, constipation, diarrhea, heartburn, melena, nausea and vomiting.  Genitourinary: Negative for dysuria, flank pain, frequency, hematuria and urgency.  Musculoskeletal: Negative for back pain, joint pain and myalgias.  Skin: Negative for rash.  Neurological: Negative for dizziness, tingling, focal weakness, seizures, weakness and headaches.  Endo/Heme/Allergies: Does not bruise/bleed easily.  Psychiatric/Behavioral: Negative for depression and suicidal ideas. The patient does not have insomnia.        Allergies  Allergen Reactions  . Tape Rash  . Gabapentin     Headache, "felt weird in the head"  . Sulfa Antibiotics   . Shellfish Allergy Rash     Past Medical History:  Diagnosis Date  . Cancer [facility]) July 2014   T2,N2a, Modified radical mastectomy. 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 Hypertension   . Malignant neoplasm of breast (female), unspecified site July 2014   Her case was presented at the [facility] tumor board in July 2014. Recommendations were for chest wall/peripheral lymphatic radiation if metastatic disease was not identified, anti-estrogen therapy only if additional metastatic disease was detected on a PET CT. No plans for adjuvant chemotherapy if metastatic disease was identified was recommended.  . Sciatic pain 2015     Past Surgical History:  Procedure Laterality Date  . ABDOMINAL HYSTERECTOMY    . BREAST SURGERY Left 04-28-13   left mastectomy, SN biopsy  . EPIDURAL  BLOCK INJECTION  2015   x3    Social History   Socioeconomic History  . Marital status: Married    Spouse name: Not on file  . Number of children: Not on file  . Years of education: Not on file  . Highest education level: Not on  file  Social Needs  . Financial resource strain: Not on file  . Food insecurity - worry: Not on file  . Food insecurity - inability: Not on file  . Transportation needs - medical: Not on file  . Transportation needs - non-medical: Not on file  Occupational History  . Not on file  Tobacco Use  . Smoking status: Never Smoker  . Smokeless tobacco: Never Used  Substance and Sexual Activity  . Alcohol use: No  . Drug use: No  . Sexual activity: Not on file  Other Topics Concern  . Not on file  Social History Narrative  . Not on file    Family History  Problem Relation Age of Onset  . Stroke Mother   . Heart disease Father   . Stroke Father   . Parkinson's disease Sister   . Parkinson's disease Brother      Current Outpatient Medications:  .  acetaminophen (TYLENOL) 325 MG tablet, Take 650 mg by mouth every 6 (six) hours as needed., Disp: , Rfl:  .  alendronate (FOSAMAX) 70 MG tablet, TAKE 1 TABLET BY MOUTH EVERY 7 DAYS. TAKE WITH A FULL GLASS OF WATER ON AN EMPTY STOMACH., Disp: 4 tablet, Rfl: 11 .  b complex vitamins tablet, Take 1 tablet by mouth daily., Disp: , Rfl:  .  cefdinir (OMNICEF) 300 MG capsule, , Disp: , Rfl:  .  docusate sodium (COLACE) 100 MG capsule, Take 100 mg by mouth daily., Disp: , Rfl:  .  famotidine (PEPCID) 10 MG tablet, Take 10 mg by mouth daily as needed for heartburn or indigestion., Disp: , Rfl:  .  fluticasone (FLONASE) 50 MCG/ACT nasal spray, , Disp: , Rfl:  .  hydrochlorothiazide (HYDRODIURIL) 25 MG tablet, TAKE 1 TABLET BY MOUTH EVERY DAY, Disp: 90 tablet, Rfl: 3 .  ibuprofen (ADVIL,MOTRIN) 100 MG tablet, Take 100 mg by mouth every 6 (six) hours as needed for fever., Disp: , Rfl:  .  letrozole (FEMARA) 2.5 MG tablet, Take 1 tablet (2.5 mg total) by mouth daily., Disp: 90 tablet, Rfl: 3 .  methylPREDNISolone (MEDROL DOSEPAK) 4 MG TBPK tablet, , Disp: , Rfl:  .  naproxen sodium (ALEVE) 220 MG tablet, Take by mouth., Disp: , Rfl:  .  potassium  chloride (MICRO-K) 10 MEQ CR capsule, TAKE 1 CAPSULE BY MOUTH EVERY DAY., Disp: 90 capsule, Rfl: 3 .  Saline 0.2 % SOLN, , Disp: , Rfl:  .  sertraline (ZOLOFT) 100 MG tablet, TAKE 1 TABLET BY MOUTH EVERY DAY, Disp: 30 tablet, Rfl: 11  Physical exam:  Vitals:   11/04/17 1508  BP: 130/71  Pulse: 77  Resp: 18  Temp: 97.8 F (36.6 C)  TempSrc: Tympanic  Weight: 141 lb 1.5 oz (64 kg)   Physical Exam  Constitutional: She is oriented to person, place, and time and well-developed, well-nourished, and in no distress.  HENT:  Head: Normocephalic and atraumatic.  Eyes: EOM are normal. Pupils are equal, round, and reactive to light.  Neck: Normal range of motion.  Cardiovascular: Normal rate, regular rhythm and normal heart sounds.  Pulmonary/Chest: Effort normal and breath sounds normal.  Abdominal: Soft. Bowel sounds are normal.  Neurological:  She is alert and oriented to person, place, and time.  Skin: Skin is warm and dry.     CMP Latest Ref Rng & Units 09/20/2017  Glucose 65 - 99 mg/dL 107(H)  BUN 7 - 25 mg/dL 17  Creatinine 0.60 - 0.88 mg/dL 0.65  Sodium 135 - 146 mmol/L 137  Potassium 3.5 - 5.3 mmol/L 3.6  Chloride 98 - 110 mmol/L 100  CO2 20 - 32 mmol/L 29  Calcium 8.6 - 10.4 mg/dL 9.7  Total Protein 6.1 - 8.1 g/dL 6.3  Total Bilirubin 0.2 - 1.2 mg/dL 0.6  Alkaline Phos 39 - 117 IU/L -  AST 10 - 35 U/L 16  ALT 6 - 29 U/L 13   CBC Latest Ref Rng & Units 10/21/2017  WBC 3.6 - 11.0 K/uL 10.6  Hemoglobin 12.0 - 16.0 g/dL 10.1(L)  Hematocrit 35.0 - 47.0 % 31.9(L)  Platelets 150 - 440 K/uL 210     Assessment and plan- Patient is a 84 y.o. female referred for normocytic anemia  I discussed the results of the blood work with the patient.  Patient had a platelet count of 105 1 month ago which is now normalized to 210.  Also hemoglobin is mildly improved from 9.6 to 10.1 although it is not still at her baseline of 12 which was seen in April 2018.  Results of her anemia workup  including iron studies, B12, folate, reticulocyte count, TSH have been normal.  No evidence of multiple myeloma and labs do not support any hemolysis either.  This is consistent with either anemia of chronic disease or given her age 68 is a possibility.  At this time I am inclined to monitor her CBC conservatively without any additional intervention such as a bone marrow biopsy.  I will repeat her CBC with differential in 3 months in 6 months and I will see her back in 6 months time   Visit Diagnosis 1. Normocytic anemia      Dr. Randa Evens, MD, MPH [place] at [facility] Pager- [phone] 11/04/2017

## 2017-11-05 DIAGNOSIS — J0100 Acute maxillary sinusitis, unspecified: Secondary | ICD-10-CM | POA: Diagnosis not present

## 2017-11-05 DIAGNOSIS — H903 Sensorineural hearing loss, bilateral: Secondary | ICD-10-CM | POA: Diagnosis not present

## 2017-11-19 ENCOUNTER — Telehealth: Payer: Self-pay | Admitting: Family Medicine

## 2017-11-19 NOTE — Telephone Encounter (Signed)
Patient wants to now if she should cancel her appt for 12/02/17 with Dr. Rosanna Randy as she is seeing Dr. Janese Banks now.

## 2017-11-22 DIAGNOSIS — C50112 Malignant neoplasm of central portion of left female breast: Secondary | ICD-10-CM | POA: Diagnosis not present

## 2017-11-22 DIAGNOSIS — Z4432 Encounter for fitting and adjustment of external left breast prosthesis: Secondary | ICD-10-CM | POA: Diagnosis not present

## 2017-12-02 ENCOUNTER — Other Ambulatory Visit: Payer: Self-pay

## 2017-12-02 ENCOUNTER — Ambulatory Visit (INDEPENDENT_AMBULATORY_CARE_PROVIDER_SITE_OTHER): Payer: Medicare Other | Admitting: Family Medicine

## 2017-12-02 VITALS — BP 148/70 | HR 70 | Temp 97.8°F | Resp 16 | Wt 141.0 lb

## 2017-12-02 DIAGNOSIS — I10 Essential (primary) hypertension: Secondary | ICD-10-CM | POA: Diagnosis not present

## 2017-12-02 DIAGNOSIS — C50412 Malignant neoplasm of upper-outer quadrant of left female breast: Secondary | ICD-10-CM | POA: Diagnosis not present

## 2017-12-02 DIAGNOSIS — Z170 Estrogen receptor positive status [ER+]: Secondary | ICD-10-CM

## 2017-12-02 DIAGNOSIS — D638 Anemia in other chronic diseases classified elsewhere: Secondary | ICD-10-CM

## 2017-12-02 NOTE — Progress Notes (Signed)
Crystal Carpenter  MRN: [id number] DOB: 01/10/1924  Subjective:  HPI   The patient is a 84 year old female who presents for follow up of her anemia.  She was last seen on 10/10/17.  She has since started being seen by oncology/hematology.  She states that her numbers have come up and she is feeling well.     Lab Results  Component Value Date   WBC 10.6 10/21/2017   HGB 10.1 (L) 10/21/2017   HCT 31.9 (L) 10/21/2017   MCV 88.9 10/21/2017   PLT 210 10/21/2017    Patient Active Problem List   Diagnosis Date Noted  . Malignant neoplasm of upper-outer quadrant of left breast in female, estrogen receptor positive ([place]) 05/22/2017  . Allergic rhinitis 08/25/2015  . Arthropathia 08/25/2015  . Clinical depression 08/25/2015  . Urinary system disease 08/25/2015  . Calcium blood increased 08/25/2015  . Lumbar canal stenosis 06/08/2014  . Personal history of malignant neoplasm of breast 06/30/2013  . Malignant neoplasm of breast (female) ([place]) 05/25/2013  . Cancer ([place])   . Breast cancer ([place]) 04/09/2013  . Acid reflux 02/24/2010  . Hypercholesterolemia 02/24/2010  . Avitaminosis D 02/28/2009  . BP (high blood pressure) 07/01/2007    Past Medical History:  Diagnosis Date  . Cancer [facility]) July 2014   T2,N2a, Modified radical mastectomy. 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 Hypertension   . Malignant neoplasm of breast (female), unspecified site July 2014   Her case was presented at the [facility] tumor board in July 2014. Recommendations were for chest wall/peripheral lymphatic radiation if metastatic disease was not identified, anti-estrogen therapy only if additional metastatic disease was detected on a PET CT. No plans for adjuvant chemotherapy if metastatic disease was identified was recommended.  . Sciatic pain 2015    Social History   Socioeconomic History  . Marital status: Married    Spouse name: Not on file  . Number of children: Not on file  . Years  of education: Not on file  . Highest education level: Not on file  Social Needs  . Financial resource strain: Not on file  . Food insecurity - worry: Not on file  . Food insecurity - inability: Not on file  . Transportation needs - medical: Not on file  . Transportation needs - non-medical: Not on file  Occupational History  . Not on file  Tobacco Use  . Smoking status: Never Smoker  . Smokeless tobacco: Never Used  Substance and Sexual Activity  . Alcohol use: No  . Drug use: No  . Sexual activity: Not on file  Other Topics Concern  . Not on file  Social History Narrative  . Not on file    Outpatient Encounter Medications as of 12/02/2017  Medication Sig Note  . acetaminophen (TYLENOL) 325 MG tablet Take 650 mg by mouth every 6 (six) hours as needed.   [name] alendronate (FOSAMAX) 70 MG tablet TAKE 1 TABLET BY MOUTH EVERY 7 DAYS. TAKE WITH A FULL GLASS OF WATER ON AN EMPTY STOMACH.   [name] b complex vitamins tablet Take 1 tablet by mouth daily.   [name] docusate sodium (COLACE) 100 MG capsule Take 100 mg by mouth daily.   . famotidine (PEPCID) 10 MG tablet Take 10 mg by mouth daily as needed for heartburn or indigestion.   . fluticasone (FLONASE) 50 MCG/ACT nasal spray    . hydrochlorothiazide (HYDRODIURIL) 25 MG tablet TAKE 1 TABLET BY MOUTH EVERY DAY   .  ibuprofen (ADVIL,MOTRIN) 100 MG tablet Take 100 mg by mouth every 6 (six) hours as needed for fever.   [name] letrozole (FEMARA) 2.5 MG tablet Take 1 tablet (2.5 mg total) by mouth daily.   . naproxen sodium (ALEVE) 220 MG tablet Take by mouth. 08/25/2015: Medication taken as needed.  Received from: [facility]  . potassium chloride (MICRO-K) 10 MEQ CR capsule TAKE 1 CAPSULE BY MOUTH EVERY DAY.   . Saline 0.2 % SOLN  08/25/2015: Received from: [facility]  . sertraline (ZOLOFT) 100 MG tablet TAKE 1 TABLET BY MOUTH EVERY DAY    No facility-administered encounter medications on file as of 12/02/2017.      Allergies  Allergen Reactions  . Tape Rash  . Gabapentin     Headache, "felt weird in the head"  . Sulfa Antibiotics   . Shellfish Allergy Rash    Review of Systems  Constitutional: Positive for malaise/fatigue (improving). Negative for fever.  HENT: Negative.   Eyes: Negative.   Respiratory: Negative for cough, shortness of breath and wheezing.   Cardiovascular: Negative for chest pain, palpitations, orthopnea, claudication and leg swelling.  Gastrointestinal: Negative.  Negative for abdominal pain and blood in stool.  Skin: Negative.   Neurological: Negative for weakness.  Endo/Heme/Allergies: Negative.   Psychiatric/Behavioral: Negative.     Objective:  BP (!) 148/70 (BP Location: Right Arm, Patient Position: Sitting, Cuff Size: Normal)   Pulse 70   Temp 97.8 F (36.6 C) (Oral)   Resp 16   Wt 141 lb (64 kg)   SpO2 99%   BMI 26.62 kg/m   Physical Exam  Constitutional: She is oriented to person, place, and time and well-developed, well-nourished, and in no distress.  HENT:  Head: Normocephalic and atraumatic.  Right Ear: External ear normal.  Left Ear: External ear normal.  Nose: Nose normal.  Mouth/Throat: Oropharynx is clear and moist.  Eyes: Conjunctivae are normal. No scleral icterus.  Neck: No thyromegaly present.  Cardiovascular: Normal rate, regular rhythm and normal heart sounds.  Pulmonary/Chest: Effort normal and breath sounds normal.  Abdominal: Soft.  Neurological: She is alert and oriented to person, place, and time. GCS score is 15.  Skin: Skin is warm and dry.  Psychiatric: Mood, memory, affect and judgment normal.    Assessment and Plan :  Anemia of chronic disease Breast Cancer HTN MDD  I have done the exam and reviewed the chart and it is accurate to the best of my knowledge. Development worker, community has been used and  any errors in dictation or transcription are unintentional. Miguel Aschoff M.D. [facility]

## 2017-12-13 DIAGNOSIS — M6281 Muscle weakness (generalized): Secondary | ICD-10-CM | POA: Diagnosis not present

## 2017-12-13 DIAGNOSIS — R627 Adult failure to thrive: Secondary | ICD-10-CM | POA: Diagnosis not present

## 2017-12-23 ENCOUNTER — Other Ambulatory Visit: Payer: Self-pay | Admitting: Family Medicine

## 2018-01-08 ENCOUNTER — Ambulatory Visit (INDEPENDENT_AMBULATORY_CARE_PROVIDER_SITE_OTHER): Payer: Medicare Other | Admitting: Family Medicine

## 2018-01-08 ENCOUNTER — Encounter: Payer: Self-pay | Admitting: Family Medicine

## 2018-01-08 VITALS — BP 120/58 | HR 79 | Temp 98.5°F | Resp 16 | Wt 136.0 lb

## 2018-01-08 DIAGNOSIS — C50412 Malignant neoplasm of upper-outer quadrant of left female breast: Secondary | ICD-10-CM

## 2018-01-08 DIAGNOSIS — Z170 Estrogen receptor positive status [ER+]: Secondary | ICD-10-CM

## 2018-01-08 DIAGNOSIS — J069 Acute upper respiratory infection, unspecified: Secondary | ICD-10-CM | POA: Diagnosis not present

## 2018-01-08 DIAGNOSIS — R3915 Urgency of urination: Secondary | ICD-10-CM | POA: Diagnosis not present

## 2018-01-08 LAB — POCT URINALYSIS DIPSTICK
Appearance: NORMAL
Bilirubin, UA: NEGATIVE
Glucose, UA: NEGATIVE
KETONES UA: NEGATIVE
LEUKOCYTES UA: NEGATIVE
NITRITE UA: NEGATIVE
Odor: NORMAL
SPEC GRAV UA: 1.015 (ref 1.010–1.025)
Urobilinogen, UA: 0.2 E.U./dL
pH, UA: 7 (ref 5.0–8.0)

## 2018-01-08 MED ORDER — AMOXICILLIN 500 MG PO CAPS: 500.0000 mg | ORAL_CAPSULE | Freq: Three times a day (TID) | ORAL | 0 refills | Status: DC

## 2018-01-08 NOTE — Patient Instructions (Addendum)
  Ethmoid Sinusitis  Patient was given Amoxicillin 500 mg tid for 7 days.

## 2018-01-08 NOTE — Progress Notes (Signed)
[name]    DOB: [birth year]   84 y.o.   MRN: [id number] Visit Date: 01/08/2018  Today's Provider: Wilhemena Durie, MD   Chief Complaint  Patient presents with  . Cough   Subjective:    Patient has had a cough and chest congestion for 3 days. Patient states she also has symptoms of headache, runny nose, and sinus pressure. Patient has been taking otc Robitussin DM with mild relief.     Allergies  Allergen Reactions  . Tape Rash  . Gabapentin     Headache, "felt weird in the head"  . Sulfa Antibiotics   . Shellfish Allergy Rash     Current Outpatient Medications:  .  acetaminophen (TYLENOL) 325 MG tablet, Take 650 mg by mouth every 6 (six) hours as needed., Disp: , Rfl:  .  b complex vitamins tablet, Take 1 tablet by mouth daily., Disp: , Rfl:  .  docusate sodium (COLACE) 100 MG capsule, Take 100 mg by mouth daily., Disp: , Rfl:  .  famotidine (PEPCID) 10 MG tablet, Take 10 mg by mouth daily as needed for heartburn or indigestion., Disp: , Rfl:  .  fluticasone (FLONASE) 50 MCG/ACT nasal spray, , Disp: , Rfl:  .  hydrochlorothiazide (HYDRODIURIL) 25 MG tablet, TAKE 1 TABLET BY MOUTH EVERY DAY, Disp: 90 tablet, Rfl: 3 .  ibuprofen (ADVIL,MOTRIN) 100 MG tablet, Take 100 mg by mouth every 6 (six) hours as needed for fever., Disp: , Rfl:  .  letrozole (FEMARA) 2.5 MG tablet, Take 1 tablet (2.5 mg total) by mouth daily., Disp: 90 tablet, Rfl: 3 .  naproxen sodium (ALEVE) 220 MG tablet, Take by mouth., Disp: , Rfl:  .  potassium chloride (MICRO-K) 10 MEQ CR capsule, TAKE 1 CAPSULE BY MOUTH EVERY DAY., Disp: 90 capsule, Rfl: 3 .  Saline 0.2 % SOLN, , Disp: , Rfl:  .  sertraline (ZOLOFT) 100 MG tablet, TAKE 1 TABLET BY MOUTH EVERY DAY, Disp: 30 tablet, Rfl: 11 .  alendronate (FOSAMAX) 70 MG tablet, TAKE 1 TABLET BY MOUTH EVERY 7 DAYS. TAKE WITH A FULL GLASS OF WATER ON AN EMPTY STOMACH. (Patient not taking: Reported on 01/08/2018), Disp: 4 tablet, Rfl:  11 .  amoxicillin (AMOXIL) 500 MG capsule, Take 1 capsule (500 mg total) by mouth 3 (three) times daily., Disp: 27 capsule, Rfl: 0  Review of Systems  Constitutional: Negative for appetite change, chills, fatigue and fever.  HENT: Positive for congestion, sinus pressure and sore throat.   Eyes: Negative.   Respiratory: Positive for cough and shortness of breath. Negative for chest tightness and wheezing.   Cardiovascular: Negative for chest pain and palpitations.  Gastrointestinal: Negative for abdominal pain, nausea and vomiting.  Genitourinary: Positive for frequency and urgency.  Musculoskeletal: Positive for arthralgias.  Allergic/Immunologic: Negative.   Neurological: Negative for dizziness and weakness.  Psychiatric/Behavioral: Negative.     Social History   Tobacco Use  . Smoking status: Never Smoker  . Smokeless tobacco: Never Used  Substance Use Topics  . Alcohol use: No   Objective:   BP (!) 120/58 (BP Location: Right Arm, Patient Position: Sitting, Cuff Size: Normal)   Pulse 79   Temp 98.5 F (36.9 C) (Oral)   Resp 16   Wt 136 lb (61.7 kg)   SpO2 98%   BMI 25.68 kg/m  Vitals:   01/08/18 1048  BP: (!) 120/58  Pulse: 79  Resp: 16  Temp: 98.5 F (  36.9 C)  TempSrc: Oral  SpO2: 98%  Weight: 136 lb (61.7 kg)     Physical Exam  Constitutional: She is oriented to person, place, and time. She appears well-developed and well-nourished.  HENT:  Head: Normocephalic.  Eyes: Conjunctivae are normal. No scleral icterus.  Neck: No thyromegaly present.  Cardiovascular: Normal rate, regular rhythm and normal heart sounds.  Pulmonary/Chest: Effort normal and breath sounds normal.  Abdominal: Soft.  Neurological: She is alert and oriented to person, place, and time.  Skin: Skin is warm and dry.  Psychiatric: She has a normal mood and affect. Her behavior is normal. Judgment and thought content normal.        Assessment & Plan:     1. Urinary urgency  - POCT  urinalysis dipstick 2.URI Amoxil if she worsens.  I have done the exam and reviewed the chart and it is accurate to the best of my knowledge. Development worker, community has been used and  any errors in dictation or transcription are unintentional. Miguel Aschoff M.D. [place], MD  [facility]

## 2018-01-17 ENCOUNTER — Telehealth: Payer: Self-pay | Admitting: Family Medicine

## 2018-01-17 NOTE — Telephone Encounter (Signed)
Patient states that you gave her amoxicillin (AMOXIL) 500 MG capsule on 01/08/2018 and she finished it yesterday and she states that she does not feel like she has improved any.  She would like to know if you could giver her a refill or call in something else.  She states that she is coughing up mucus that is sometimes yellow, nasal congestion and a headache.  She uses [facility].

## 2018-01-21 NOTE — Telephone Encounter (Signed)
Pt advised. States she has an appointment with Dr. Pryor Ochoa tomorrow regarding this cough.

## 2018-01-21 NOTE — Telephone Encounter (Signed)
Expectorant/fluids.

## 2018-01-22 DIAGNOSIS — J301 Allergic rhinitis due to pollen: Secondary | ICD-10-CM | POA: Diagnosis not present

## 2018-01-22 DIAGNOSIS — H6122 Impacted cerumen, left ear: Secondary | ICD-10-CM | POA: Diagnosis not present

## 2018-01-22 DIAGNOSIS — J0100 Acute maxillary sinusitis, unspecified: Secondary | ICD-10-CM | POA: Diagnosis not present

## 2018-01-27 ENCOUNTER — Telehealth: Payer: Self-pay | Admitting: Family Medicine

## 2018-01-27 DIAGNOSIS — R059 Cough, unspecified: Secondary | ICD-10-CM

## 2018-01-27 DIAGNOSIS — R05 Cough: Secondary | ICD-10-CM

## 2018-01-27 NOTE — Telephone Encounter (Signed)
Pt calling to request a chest x-ray, pt states she is still coughing and would feel better if she had a x-ray. If chest x-ray is scheduled for tomorrow pt states she needs it to be after 2:30 pm. Thanks CC

## 2018-01-27 NOTE — Telephone Encounter (Signed)
Could you advise for Dr. Rosanna Randy please. Thanks.

## 2018-01-28 ENCOUNTER — Ambulatory Visit
Admission: RE | Admit: 2018-01-28 | Discharge: 2018-01-28 | Disposition: A | Discharge status: Home / Self Care | Payer: Medicare Other | Source: Ambulatory Visit | Attending: Family Medicine | Admitting: Family Medicine

## 2018-01-28 DIAGNOSIS — R05 Cough: Secondary | ICD-10-CM | POA: Diagnosis not present

## 2018-01-28 DIAGNOSIS — Z9012 Acquired absence of left breast and nipple: Secondary | ICD-10-CM | POA: Insufficient documentation

## 2018-01-28 DIAGNOSIS — R059 Cough, unspecified: Secondary | ICD-10-CM

## 2018-01-28 DIAGNOSIS — I700 Atherosclerosis of aorta: Secondary | ICD-10-CM | POA: Insufficient documentation

## 2018-01-28 NOTE — Telephone Encounter (Signed)
Order for chest XRay sent to outpatient imaging center.  She can go when she wants.  Virginia Crews, MD, MPH [facility] 01/28/2018 8:20 AM

## 2018-01-28 NOTE — Telephone Encounter (Signed)
Pt advised this was ordered. Will go this afternoon.

## 2018-01-29 ENCOUNTER — Telehealth: Payer: Self-pay

## 2018-01-29 ENCOUNTER — Telehealth: Payer: Self-pay | Admitting: Family Medicine

## 2018-01-29 NOTE — Telephone Encounter (Signed)
No answer

## 2018-01-29 NOTE — Telephone Encounter (Signed)
Daughter is concerned about the arthrosclerosis finding on the cxr. I advised daughter that this is not abnormal given pt's age, but she is concerned because pt is fatigued with worsening SOB. Advised daughter that fatigue may be from pt's anemia. Daughter states she has an appointment to check labs for her anemia with a specialist. Offered appointment with Dr. Rosanna Randy, but daughter wanted to ask you for advise instead. FYI- Dr. Rosanna Randy does not have an opening until next Thursday.

## 2018-01-29 NOTE — Telephone Encounter (Signed)
-----   Message from Virginia Crews, MD sent at 01/29/2018  8:07 AM EDT ----- Normal chest XRay without pneumonia or fluid in the lungs.  Virginia Crews, MD, MPH [facility] 01/29/2018 8:07 AM

## 2018-01-29 NOTE — Telephone Encounter (Signed)
Pt's daughter Manuela Schwartz (on [place] from 10/2017) is requesting call back about pt's X-ray results from 01/28/18. Manuela Schwartz stated she saw the results via [place] and wanted a couple this explained to her. Please advise. Thanks TNP

## 2018-01-29 NOTE — Telephone Encounter (Signed)
Go ahead and schedule her to see someone this week.  Seems she has called about the SOB several times and has now had a CXR without lung findings.  If she has chest pain, she should go to the ER.  Virginia Crews, MD, MPH [facility] 01/29/2018 3:41 PM

## 2018-01-29 NOTE — Telephone Encounter (Signed)
Patient advised.KW 

## 2018-01-30 ENCOUNTER — Telehealth: Payer: Self-pay | Admitting: Family Medicine

## 2018-01-30 DIAGNOSIS — R05 Cough: Secondary | ICD-10-CM

## 2018-01-30 DIAGNOSIS — J069 Acute upper respiratory infection, unspecified: Secondary | ICD-10-CM

## 2018-01-30 DIAGNOSIS — R059 Cough, unspecified: Secondary | ICD-10-CM

## 2018-01-30 MED ORDER — PREDNISONE 5 MG (21) PO TBPK: ORAL_TABLET | ORAL | 0 refills | Status: DC

## 2018-01-30 NOTE — Telephone Encounter (Signed)
Pt called back about this and says that Dr. Lula Olszewski has given her prednisone in the past for this and it helped.

## 2018-01-30 NOTE — Telephone Encounter (Signed)
Prednisone 5mg --6 day taper--thx

## 2018-01-30 NOTE — Telephone Encounter (Signed)
Patient states that she is still coughing a lot and it is driving her crazy.  She states that she is taking robitussin DM and it is not helping at all.  She would like to know if you could call in some cough syrup for her.  She uses [facility].

## 2018-01-30 NOTE — Telephone Encounter (Signed)
Pt advised. Med sent in.

## 2018-01-30 NOTE — Telephone Encounter (Signed)
She had chest xray done on 01/28/18 and was normal. Please advise. Thanks.

## 2018-01-30 NOTE — Telephone Encounter (Signed)
Called daughter to advise her of this, daughter states she will ask pt if she would like to be seen tomorrow. She will call back for appointment if necessary.

## 2018-02-03 ENCOUNTER — Inpatient Hospital Stay: Payer: Medicare Other

## 2018-02-06 ENCOUNTER — Telehealth: Payer: Self-pay | Admitting: Family Medicine

## 2018-02-06 NOTE — Telephone Encounter (Signed)
Pt stated she finished predniSONE (STERAPRED UNI-PAK 21 TAB) 5 MG (21) TBPK tablet yesterday 02/05/18. Pt stated that she still just doesn't feel well and is fatigued. Pt stated that she still has her cough, it's a little better and she still has yellowish mucus. Pt is asking if there is anything she can try to help knock this out. Pt stated that she is tried of being sick and tried of not feeling well enough to do anything. [facility]. Please advise. Thanks TNP

## 2018-02-06 NOTE — Telephone Encounter (Signed)
Patient said she wanted to try taking some Mucinex over the weekend and if she wasn't better by Tuesday she would come in and be seen,.

## 2018-02-06 NOTE — Telephone Encounter (Signed)
May need to see someone tomorrow. I do not have anything that can be called in for present symptoms.May need CXR if she is still sick.

## 2018-02-10 ENCOUNTER — Inpatient Hospital Stay: Payer: Medicare Other

## 2018-02-10 ENCOUNTER — Encounter: Payer: Self-pay | Admitting: Family Medicine

## 2018-02-10 ENCOUNTER — Ambulatory Visit (INDEPENDENT_AMBULATORY_CARE_PROVIDER_SITE_OTHER): Payer: Medicare Other | Admitting: Family Medicine

## 2018-02-10 VITALS — BP 152/52 | HR 98 | Temp 97.9°F | Resp 20 | Wt 138.0 lb

## 2018-02-10 DIAGNOSIS — R05 Cough: Secondary | ICD-10-CM

## 2018-02-10 DIAGNOSIS — J3089 Other allergic rhinitis: Secondary | ICD-10-CM | POA: Diagnosis not present

## 2018-02-10 DIAGNOSIS — R053 Chronic cough: Secondary | ICD-10-CM | POA: Insufficient documentation

## 2018-02-10 DIAGNOSIS — I700 Atherosclerosis of aorta: Secondary | ICD-10-CM | POA: Diagnosis not present

## 2018-02-10 MED ORDER — CETIRIZINE HCL 10 MG PO TABS: 10.0000 mg | ORAL_TABLET | Freq: Every day | ORAL | 11 refills | Status: DC

## 2018-02-10 NOTE — Patient Instructions (Signed)

## 2018-02-10 NOTE — Assessment & Plan Note (Signed)
Discussed with the patient that given her age, it is not surprising she has aortic atherosclerosis Discussed with patient what this means and how she should continue to manage her blood pressure and other risk factors well

## 2018-02-10 NOTE — Progress Notes (Signed)
[name]    DOB: 1924-05-21   84 y.o.   MRN: [id number] Visit Date: 02/10/2018  Today's Provider: Lavon Paganini, MD   I, Martha Clan, CMA, am acting as scribe for Lavon Paganini, MD.  Chief Complaint  Patient presents with  . Cough   Subjective:    Cough  Chronicity: was seen 01/08/2018 for this. She was given Amoxil rx, without relief. Pt then saw ENT, who prescribed Cefdinir. CXR was ordered 01/28/2018, which was WNL except for atherosclerosis. Dr. Rosanna Randy prescribed Prednisone 5 mg 6 day taper on 01/30/2018. Progression since onset: gradually improving, but is lingering. Cough characteristics: some sputum production. Associated symptoms include ear pain (is improving), nasal congestion (improving), a sore throat and shortness of breath. Pertinent negatives include no chest pain, chills, ear congestion, fever, headaches, hemoptysis, postnasal drip, rhinorrhea, weight loss or wheezing. The symptoms are aggravated by pollens. Her past medical history is significant for environmental allergies.   States that the prednisone seemed to help more than anything else.  States that she does not have a f/u with ENT scheduled.  Has not been walking much lately.  Feels more fatigued.  Has noticed that exercise tolerance has decreased, but later states this has been stable for several years.  Coming into office on elevator from car in the parking lot today made her feel short of breath.  Attributes this to her age and alergy symptoms.  Patient is also concerned about her recent chest x-ray.  She does know that it showed no edema or pneumonia.  She and her daughter are concerned that it showed aortic atherosclerosis.  She also states she did not know that she had anemia (even though she was being treated by her PCP for several months for this problem).  She is followed by oncology for her history of breast cancer and states she is having labs and they are going to be  following her for her anemia later this week.    Allergies  Allergen Reactions  . Tape Rash  . Gabapentin     Headache, "felt weird in the head"  . Sulfa Antibiotics   . Shellfish Allergy Rash     Current Outpatient Medications:  .  acetaminophen (TYLENOL) 325 MG tablet, Take 650 mg by mouth every 6 (six) hours as needed., Disp: , Rfl:  .  b complex vitamins tablet, Take 1 tablet by mouth daily., Disp: , Rfl:  .  docusate sodium (COLACE) 100 MG capsule, Take 100 mg by mouth daily., Disp: , Rfl:  .  famotidine (PEPCID) 10 MG tablet, Take 10 mg by mouth daily as needed for heartburn or indigestion., Disp: , Rfl:  .  fluticasone (FLONASE) 50 MCG/ACT nasal spray, , Disp: , Rfl:  .  hydrochlorothiazide (HYDRODIURIL) 25 MG tablet, TAKE 1 TABLET BY MOUTH EVERY DAY, Disp: 90 tablet, Rfl: 3 .  ibuprofen (ADVIL,MOTRIN) 100 MG tablet, Take 100 mg by mouth every 6 (six) hours as needed for fever., Disp: , Rfl:  .  letrozole (FEMARA) 2.5 MG tablet, Take 1 tablet (2.5 mg total) by mouth daily., Disp: 90 tablet, Rfl: 3 .  naproxen sodium (ALEVE) 220 MG tablet, Take by mouth., Disp: , Rfl:  .  potassium chloride (MICRO-K) 10 MEQ CR capsule, TAKE 1 CAPSULE BY MOUTH EVERY DAY., Disp: 90 capsule, Rfl: 3 .  Saline 0.2 % SOLN, , Disp: , Rfl:  .  sertraline (ZOLOFT) 100 MG tablet, TAKE 1 TABLET BY MOUTH  EVERY DAY, Disp: 30 tablet, Rfl: 11 .  alendronate (FOSAMAX) 70 MG tablet, TAKE 1 TABLET BY MOUTH EVERY 7 DAYS. TAKE WITH A FULL GLASS OF WATER ON AN EMPTY STOMACH. (Patient not taking: Reported on 01/08/2018), Disp: 4 tablet, Rfl: 11  Review of Systems  Constitutional: Negative for chills, fever and weight loss.  HENT: Positive for ear pain (is improving) and sore throat. Negative for postnasal drip and rhinorrhea.   Respiratory: Positive for cough and shortness of breath. Negative for hemoptysis and wheezing.   Cardiovascular: Negative for chest pain.  Allergic/Immunologic: Positive for environmental  allergies.  Neurological: Negative for headaches.    Social History   Tobacco Use  . Smoking status: Never Smoker  . Smokeless tobacco: Never Used  Substance Use Topics  . Alcohol use: No   Objective:   BP (!) 152/52 (BP Location: Right Arm, Patient Position: Sitting, Cuff Size: Normal)   Pulse 98   Temp 97.9 F (36.6 C) (Oral)   Resp 20   Wt 138 lb (62.6 kg)   SpO2 96%   BMI 26.05 kg/m  Vitals:   02/10/18 1353  BP: (!) 152/52  Pulse: 98  Resp: 20  Temp: 97.9 F (36.6 C)  TempSrc: Oral  SpO2: 96%  Weight: 138 lb (62.6 kg)     Physical Exam  Constitutional: She is oriented to person, place, and time. She appears well-developed and well-nourished. No distress.  HENT:  Head: Normocephalic and atraumatic.  Right Ear: Tympanic membrane, external ear and ear canal normal.  Left Ear: Tympanic membrane, external ear and ear canal normal.  Nose: Mucosal edema present. Right sinus exhibits no maxillary sinus tenderness and no frontal sinus tenderness. Left sinus exhibits no maxillary sinus tenderness and no frontal sinus tenderness.  Mouth/Throat: Uvula is midline, oropharynx is clear and moist and mucous membranes are normal.  Eyes: Pupils are equal, round, and reactive to light. Conjunctivae are normal. Right eye exhibits no discharge. Left eye exhibits no discharge. No scleral icterus.  Neck: Neck supple. No thyromegaly present.  Cardiovascular: Normal rate, regular rhythm, normal heart sounds and intact distal pulses.  No murmur heard. Pulmonary/Chest: Effort normal and breath sounds normal. No respiratory distress. She has no wheezes. She has no rales.  Musculoskeletal: She exhibits no edema.  Lymphadenopathy:    She has no cervical adenopathy.  Neurological: She is alert and oriented to person, place, and time.  Skin: Skin is warm and dry. Capillary refill takes less than 2 seconds. No rash noted.  Psychiatric: She has a normal mood and affect. Her behavior is normal.   Vitals reviewed.     Assessment & Plan:   Problem List Items Addressed This Visit      Cardiovascular and Mediastinum   Aortic atherosclerosis ([place])    Discussed with the patient that given her age, it is not surprising she has aortic atherosclerosis Discussed with patient what this means and how she should continue to manage her blood pressure and other risk factors well        Respiratory   Allergic rhinitis - Primary    Chronic cough seems most likely related to allergic rhinitis with postnasal drip Patient has now taken amoxicillin, Ceftin ear, prednisone for this problem without relief, but it has been slowly improving Discussed with patient environmental factors that can contribute to allergies and how to avoid them Discussed importance of taking daily antihistamine such as Zyrtec She should continue her Flonase daily May need to consider addition of  Singulair in the future She has taken allergy shots in the past and is considering taking these again, but would try oral antihistamine first Her lungs are clear and she does not need a chest x-ray repeated at this time        Other   Chronic cough    See plan above for allergic rhinitis Most likely related to her allergic rhinitis as this is uncontrolled and she complains of postnasal drip and eustachian tube dysfunction Treat with oral antihistamine and Flonase and environmental precautions Could consider addition of Singulair in the future She is also considering allergy shots, but would try oral antihistamine first          Return if symptoms worsen or fail to improve.   The entirety of the information documented in the History of Present Illness, Review of Systems and Physical Exam were personally obtained by me. Portions of this information were initially documented by Raquel Sarna Ratchford, CMA and reviewed by me for thoroughness and accuracy.    Virginia Crews, MD, MPH [facility] 02/10/2018  3:40 PM

## 2018-02-10 NOTE — Telephone Encounter (Signed)
Patient wants to see Dr. Rosanna Randy today as she is not any better.  What time can we work her in??

## 2018-02-10 NOTE — Assessment & Plan Note (Signed)
Chronic cough seems most likely related to allergic rhinitis with postnasal drip Patient has now taken amoxicillin, Ceftin ear, prednisone for this problem without relief, but it has been slowly improving Discussed with patient environmental factors that can contribute to allergies and how to avoid them Discussed importance of taking daily antihistamine such as Zyrtec She should continue her Flonase daily May need to consider addition of Singulair in the future She has taken allergy shots in the past and is considering taking these again, but would try oral antihistamine first Her lungs are clear and she does not need a chest x-ray repeated at this time

## 2018-02-10 NOTE — Assessment & Plan Note (Signed)
See plan above for allergic rhinitis Most likely related to her allergic rhinitis as this is uncontrolled and she complains of postnasal drip and eustachian tube dysfunction Treat with oral antihistamine and Flonase and environmental precautions Could consider addition of Singulair in the future She is also considering allergy shots, but would try oral antihistamine first

## 2018-02-17 ENCOUNTER — Other Ambulatory Visit: Payer: Self-pay | Admitting: Oncology

## 2018-02-17 ENCOUNTER — Inpatient Hospital Stay: Payer: Medicare Other | Attending: Oncology

## 2018-02-17 ENCOUNTER — Telehealth: Payer: Self-pay | Admitting: *Deleted

## 2018-02-17 ENCOUNTER — Other Ambulatory Visit: Payer: Self-pay | Admitting: *Deleted

## 2018-02-17 DIAGNOSIS — D649 Anemia, unspecified: Secondary | ICD-10-CM

## 2018-02-17 LAB — CBC WITH DIFFERENTIAL/PLATELET
Band Neutrophils: 4 %
Basophils Absolute: 0.5 10*3/uL — ABNORMAL HIGH (ref 0–0.1)
Basophils Relative: 3 %
Blasts: 0 %
EOS PCT: 0 %
Eosinophils Absolute: 0 10*3/uL (ref 0–0.7)
HCT: 20.2 % — ABNORMAL LOW (ref 35.0–47.0)
Hemoglobin: 6.4 g/dL — ABNORMAL LOW (ref 12.0–16.0)
LYMPHS PCT: 56 %
Lymphs Abs: 9.3 10*3/uL — ABNORMAL HIGH (ref 1.0–3.6)
MCH: 30.2 pg (ref 26.0–34.0)
MCHC: 31.8 g/dL — AB (ref 32.0–36.0)
MCV: 95.1 fL (ref 80.0–100.0)
MONOS PCT: 20 %
Metamyelocytes Relative: 1 %
Monocytes Absolute: 3.3 10*3/uL — ABNORMAL HIGH (ref 0.2–0.9)
Myelocytes: 1 %
NEUTROS ABS: 3.5 10*3/uL (ref 1.4–6.5)
NEUTROS PCT: 15 %
NRBC: 2 /100{WBCs} — AB
Other: 0 %
PLATELETS: 176 10*3/uL (ref 150–440)
Promyelocytes Relative: 0 %
RBC: 2.12 MIL/uL — AB (ref 3.80–5.20)
RDW: 24.5 % — ABNORMAL HIGH (ref 11.5–14.5)
WBC: 16.6 10*3/uL — AB (ref 3.6–11.0)

## 2018-02-17 NOTE — Telephone Encounter (Signed)
Called pt and spoke to husband and pt is not here right now and she will be back.  A few  Minutes later the patient called me back and I told her that her hgb was 6.4 and I asked if she saw blood in stool, dark stools.  She said that she di not see any blood and no dark black stools.  She is tired and sob on exertion. I asked her if she can come to [place] and have blood work to type and cross blood and then get 2 units of blood. She is agreeable to this. She will register at 10:15 and she knows the directions to get to [facility]. I told her they have valet parking and if she needs wheel chair just ask the [place]. I told her she will wait for about 1 hour before the blood will be ready to give toher. She is agreeable to above. I also told her we are checking her iron levels and if it is low then she may need iv iron but once labs are back we will let her know.

## 2018-02-18 ENCOUNTER — Inpatient Hospital Stay: Payer: Medicare Other

## 2018-02-18 DIAGNOSIS — D649 Anemia, unspecified: Secondary | ICD-10-CM

## 2018-02-18 LAB — FERRITIN: Ferritin: 276 ng/mL (ref 11–307)

## 2018-02-18 LAB — ABO/RH: ABO/RH(D): A POS

## 2018-02-18 LAB — PREPARE RBC (CROSSMATCH)

## 2018-02-18 LAB — IRON AND TIBC
IRON: 168 ug/dL (ref 28–170)
SATURATION RATIOS: 53 % — AB (ref 10.4–31.8)
TIBC: 319 ug/dL (ref 250–450)
UIBC: 152 ug/dL

## 2018-02-18 LAB — VITAMIN B12: Vitamin B-12: 3641 pg/mL — ABNORMAL HIGH (ref 180–914)

## 2018-02-18 MED ORDER — ACETAMINOPHEN 325 MG PO TABS
650.0000 mg | ORAL_TABLET | Freq: Once | ORAL | Status: AC
  Administered 2018-02-18: 650 mg via ORAL
  Filled 2018-02-18: qty 2

## 2018-02-18 MED ORDER — SODIUM CHLORIDE 0.9 % IV SOLN
250.0000 mL | Freq: Once | INTRAVENOUS | Status: AC
  Administered 2018-02-18: 250 mL via INTRAVENOUS
  Filled 2018-02-18: qty 250

## 2018-02-18 NOTE — Progress Notes (Signed)
Pt tolerated infusion well. Pt and VS stable at discharge.  

## 2018-02-19 LAB — BPAM RBC
Blood Product Expiration Date: 201905242359
Blood Product Expiration Date: 201905242359
ISSUE DATE / TIME: 201905071400
ISSUE DATE / TIME: 201905071554
UNIT TYPE AND RH: 6200
Unit Type and Rh: 6200

## 2018-02-19 LAB — TYPE AND SCREEN
ABO/RH(D): A POS
Antibody Screen: NEGATIVE
UNIT DIVISION: 0
UNIT DIVISION: 0

## 2018-02-20 ENCOUNTER — Other Ambulatory Visit: Payer: Self-pay | Admitting: *Deleted

## 2018-02-20 DIAGNOSIS — D649 Anemia, unspecified: Secondary | ICD-10-CM

## 2018-02-24 ENCOUNTER — Other Ambulatory Visit: Payer: Self-pay | Admitting: Family Medicine

## 2018-02-24 DIAGNOSIS — M810 Age-related osteoporosis without current pathological fracture: Secondary | ICD-10-CM

## 2018-02-25 ENCOUNTER — Encounter: Payer: Self-pay | Admitting: Family Medicine

## 2018-02-25 ENCOUNTER — Encounter: Payer: Self-pay | Admitting: Oncology

## 2018-02-26 ENCOUNTER — Other Ambulatory Visit: Payer: Self-pay

## 2018-02-26 DIAGNOSIS — M810 Age-related osteoporosis without current pathological fracture: Secondary | ICD-10-CM

## 2018-02-26 MED ORDER — ALENDRONATE SODIUM 70 MG PO TABS: ORAL_TABLET | ORAL | 3 refills | Status: AC

## 2018-02-26 MED ORDER — POTASSIUM CHLORIDE ER 10 MEQ PO CPCR: ORAL_CAPSULE | ORAL | 3 refills | Status: DC

## 2018-02-27 ENCOUNTER — Ambulatory Visit: Payer: Medicare Other

## 2018-02-27 ENCOUNTER — Inpatient Hospital Stay: Payer: Medicare Other

## 2018-02-27 DIAGNOSIS — D649 Anemia, unspecified: Secondary | ICD-10-CM

## 2018-02-27 LAB — BASIC METABOLIC PANEL
Anion gap: 11 (ref 5–15)
BUN: 20 mg/dL (ref 6–20)
CALCIUM: 9.4 mg/dL (ref 8.9–10.3)
CO2: 25 mmol/L (ref 22–32)
Chloride: 97 mmol/L — ABNORMAL LOW (ref 101–111)
Creatinine, Ser: 0.57 mg/dL (ref 0.44–1.00)
GFR calc Af Amer: >60 mL/min (ref >60)
GFR calc non Af Amer: >60 mL/min (ref >60)
Glucose, Bld: 120 mg/dL — ABNORMAL HIGH (ref 65–99)
POTASSIUM: 3.1 mmol/L — AB (ref 3.5–5.1)
Sodium: 133 mmol/L — ABNORMAL LOW (ref 135–145)

## 2018-02-27 LAB — CBC WITH DIFFERENTIAL/PLATELET
Band Neutrophils: 6 %
Basophils Absolute: 0.4 10*3/uL — ABNORMAL HIGH (ref 0–0.1)
Basophils Relative: 2 %
EOS ABS: 0.2 10*3/uL (ref 0–0.7)
Eosinophils Relative: 1 %
HCT: 27.3 % — ABNORMAL LOW (ref 35.0–47.0)
Hemoglobin: 8.8 g/dL — ABNORMAL LOW (ref 12.0–16.0)
LYMPHS PCT: 43 %
Lymphs Abs: 7.7 10*3/uL — ABNORMAL HIGH (ref 1.0–3.6)
MCH: 29.3 pg (ref 26.0–34.0)
MCHC: 32.2 g/dL (ref 32.0–36.0)
MCV: 91.1 fL (ref 80.0–100.0)
MONO ABS: 5.4 10*3/uL — AB (ref 0.2–0.9)
MONOS PCT: 30 %
Myelocytes: 1 %
NEUTROS ABS: 3 10*3/uL (ref 1.4–6.5)
Neutrophils Relative %: 10 %
Other: 7 %
PLATELETS: 152 10*3/uL (ref 150–440)
RBC: 2.99 MIL/uL — AB (ref 3.80–5.20)
RDW: 18.7 % — AB (ref 11.5–14.5)
WBC: 17.9 10*3/uL — AB (ref 3.6–11.0)
nRBC: 1 /100 WBC — ABNORMAL HIGH

## 2018-02-27 LAB — SAMPLE TO BLOOD BANK

## 2018-02-27 LAB — PATHOLOGIST SMEAR REVIEW

## 2018-02-28 ENCOUNTER — Ambulatory Visit: Payer: Medicare Other

## 2018-03-05 DIAGNOSIS — H43813 Vitreous degeneration, bilateral: Secondary | ICD-10-CM | POA: Diagnosis not present

## 2018-03-05 DIAGNOSIS — H1045 Other chronic allergic conjunctivitis: Secondary | ICD-10-CM | POA: Diagnosis not present

## 2018-03-11 ENCOUNTER — Ambulatory Visit: Payer: Medicare Other

## 2018-03-11 ENCOUNTER — Telehealth: Payer: Self-pay | Admitting: *Deleted

## 2018-03-11 ENCOUNTER — Other Ambulatory Visit: Payer: Self-pay | Admitting: *Deleted

## 2018-03-11 ENCOUNTER — Inpatient Hospital Stay: Payer: Medicare Other

## 2018-03-11 ENCOUNTER — Other Ambulatory Visit: Payer: Self-pay | Admitting: Oncology

## 2018-03-11 DIAGNOSIS — D649 Anemia, unspecified: Secondary | ICD-10-CM | POA: Diagnosis not present

## 2018-03-11 LAB — CBC WITH DIFFERENTIAL/PLATELET
BASOS ABS: 0.3 10*3/uL — AB (ref 0–0.1)
Band Neutrophils: 1 %
Basophils Relative: 1 %
Blasts: 0 %
EOS ABS: 0.5 10*3/uL (ref 0–0.7)
Eosinophils Relative: 2 %
HCT: 22.6 % — ABNORMAL LOW (ref 35.0–47.0)
HEMOGLOBIN: 7.2 g/dL — AB (ref 12.0–16.0)
LYMPHS ABS: 15.7 10*3/uL — AB (ref 1.0–3.6)
Lymphocytes Relative: 63 %
MCH: 29.2 pg (ref 26.0–34.0)
MCHC: 32.1 g/dL (ref 32.0–36.0)
MCV: 91 fL (ref 80.0–100.0)
METAMYELOCYTES PCT: 0 %
Monocytes Absolute: 5.2 10*3/uL — ABNORMAL HIGH (ref 0.2–0.9)
Monocytes Relative: 21 %
Myelocytes: 0 %
Neutro Abs: 3.2 10*3/uL (ref 1.4–6.5)
Neutrophils Relative %: 12 %
Other: 0 %
Platelets: 224 10*3/uL (ref 150–440)
Promyelocytes Relative: 0 %
RBC: 2.48 MIL/uL — AB (ref 3.80–5.20)
RDW: 18.6 % — ABNORMAL HIGH (ref 11.5–14.5)
WBC: 24.9 10*3/uL — ABNORMAL HIGH (ref 3.6–11.0)
nRBC: 0 /100 WBC

## 2018-03-11 LAB — SAMPLE TO BLOOD BANK

## 2018-03-11 LAB — PREPARE RBC (CROSSMATCH)

## 2018-03-11 NOTE — Telephone Encounter (Signed)
Called pt to ask her how she is feeling and she says she is ok but she gets sob and fatigue when she gets up and moves around. She would be glad to get 1 unit of blood to make her feel better. I spoke to [place] and she is agreeable to blood tom. I have sent email to [facility] in blood bank for blood in [place] tom at 10 am. Patient will be there in [place]. I have also spoke to kristin in [place] and she knows about the blood transfusion. All orders released to get blood set up

## 2018-03-12 ENCOUNTER — Inpatient Hospital Stay: Payer: Medicare Other

## 2018-03-12 DIAGNOSIS — D649 Anemia, unspecified: Secondary | ICD-10-CM | POA: Diagnosis not present

## 2018-03-12 MED ORDER — ACETAMINOPHEN 325 MG PO TABS
650.0000 mg | ORAL_TABLET | Freq: Once | ORAL | Status: AC
  Administered 2018-03-12: 650 mg via ORAL
  Filled 2018-03-12: qty 2

## 2018-03-12 MED ORDER — SODIUM CHLORIDE 0.9 % IV SOLN
250.0000 mL | Freq: Once | INTRAVENOUS | Status: AC
  Administered 2018-03-12: 250 mL via INTRAVENOUS
  Filled 2018-03-12: qty 250

## 2018-03-13 LAB — TYPE AND SCREEN
ABO/RH(D): A POS
ANTIBODY SCREEN: NEGATIVE
Unit division: 0

## 2018-03-13 LAB — BPAM RBC
BLOOD PRODUCT EXPIRATION DATE: 201906062359
ISSUE DATE / TIME: 201905291042
Unit Type and Rh: 6200

## 2018-03-20 ENCOUNTER — Encounter: Payer: Self-pay | Admitting: Oncology

## 2018-03-21 ENCOUNTER — Ambulatory Visit: Payer: Medicare Other | Admitting: Oncology

## 2018-03-21 ENCOUNTER — Other Ambulatory Visit: Payer: Medicare Other

## 2018-03-24 ENCOUNTER — Other Ambulatory Visit: Payer: Self-pay | Admitting: *Deleted

## 2018-03-24 ENCOUNTER — Inpatient Hospital Stay (HOSPITAL_BASED_OUTPATIENT_CLINIC_OR_DEPARTMENT_OTHER): Payer: Medicare Other | Admitting: Oncology

## 2018-03-24 ENCOUNTER — Encounter: Payer: Self-pay | Admitting: Oncology

## 2018-03-24 ENCOUNTER — Inpatient Hospital Stay: Payer: Medicare Other | Attending: Oncology

## 2018-03-24 VITALS — BP 112/53 | HR 74 | Temp 97.7°F | Resp 16 | Ht 61.02 in | Wt 134.7 lb

## 2018-03-24 DIAGNOSIS — I10 Essential (primary) hypertension: Secondary | ICD-10-CM | POA: Insufficient documentation

## 2018-03-24 DIAGNOSIS — D649 Anemia, unspecified: Secondary | ICD-10-CM

## 2018-03-24 DIAGNOSIS — C9200 Acute myeloblastic leukemia, not having achieved remission: Secondary | ICD-10-CM | POA: Insufficient documentation

## 2018-03-24 DIAGNOSIS — D72829 Elevated white blood cell count, unspecified: Secondary | ICD-10-CM

## 2018-03-24 DIAGNOSIS — Z853 Personal history of malignant neoplasm of breast: Secondary | ICD-10-CM | POA: Insufficient documentation

## 2018-03-24 LAB — CBC WITH DIFFERENTIAL/PLATELET
BASOS ABS: 0.5 10*3/uL — AB (ref 0–0.1)
BASOS PCT: 2 %
Eosinophils Absolute: 0.1 10*3/uL (ref 0–0.7)
Eosinophils Relative: 0 %
HEMATOCRIT: 23.1 % — AB (ref 35.0–47.0)
HEMOGLOBIN: 7.5 g/dL — AB (ref 12.0–16.0)
Lymphocytes Relative: 46 %
Lymphs Abs: 13.2 10*3/uL — ABNORMAL HIGH (ref 1.0–3.6)
MCH: 29.3 pg (ref 26.0–34.0)
MCHC: 32.5 g/dL (ref 32.0–36.0)
MCV: 90.1 fL (ref 80.0–100.0)
Monocytes Absolute: 11 10*3/uL — ABNORMAL HIGH (ref 0.2–0.9)
Monocytes Relative: 38 %
Neutro Abs: 4.1 10*3/uL (ref 1.4–6.5)
Neutrophils Relative %: 14 %
Platelets: 206 10*3/uL (ref 150–440)
RBC: 2.57 MIL/uL — AB (ref 3.80–5.20)
RDW: 17.9 % — ABNORMAL HIGH (ref 11.5–14.5)
WBC: 28.9 10*3/uL — ABNORMAL HIGH (ref 3.6–11.0)

## 2018-03-24 LAB — SAMPLE TO BLOOD BANK

## 2018-03-24 LAB — PREPARE RBC (CROSSMATCH)

## 2018-03-24 NOTE — Progress Notes (Signed)
Hematology/Oncology Consult note [facility]  [phone] [phone]  Patient Care Team: Jerrol Banana., MD as PCP - General (Family Medicine) Bary Castilla, Forest Gleason, MD as Consulting Physician (General Surgery) Rayetta Humphrey as Consulting Physician (Optometry)   Name of the patient: Crystal Carpenter  [id number]  Aug 29, 1924   Date of visit: 03/24/18  Diagnosis- normocytic anemia and worsening leucocytosis  Chief complaint/ Reason for visit- routine f/u of leucocytosis and anemia  Heme/Onc history: patient is a 84 year old female with a past medical history significant for breast cancer, hypercholesterolemia and hypertension among other medical problems. She has been referred to [place] for anemia. Recent CBC from 09/20/2017 showed white count of 5.6, H&H of 9.6/29.8 and a platelet count of 105. TSH was normal at 1.11. CMP was within normal limits. Serum iron was normal at 103. B12 levels were elevated at 1905 and folate was normal at greater than 24. Stool FOBT was negative.  Patient lives with her 40 year old husband and is independent of her ADLs and IADLs. She is still driving. She does feel fatigued on and off but today she reports feeling well. She denies any blood in her stool or urine.   Results of blood work from 10/21/2017 were as follows: CBC showed white count of 10.6, H&H of 10.1/31.9 with a platelet count of 210.  Iron studies, haptoglobin and reticulocyte count was normal.  Multiple myeloma panel showed no monoclonal protein and IFE was normal  Patients hb dropped down to 6.4 in may 2019. She received 2 units of prbc transfusion. She also had repeat anemia work up which was unremarkable  Interval history- overall she is doing stable. Appetite is good and her weight is stable. No recurrent infections or hospitalizations. She is still having some ongoing problems with allergies for which she is taking zyrtec. She does  report fatigue and SOB on mild exertion  ECOG PS- 2 Pain scale- 0   Review of systems- Review of Systems  Constitutional: Positive for malaise/fatigue. Negative for chills, fever and weight loss.  HENT: Negative for congestion, ear discharge and nosebleeds.   Eyes: Negative for blurred vision.  Respiratory: Positive for shortness of breath. Negative for cough, hemoptysis, sputum production and wheezing.   Cardiovascular: Negative for chest pain, palpitations, orthopnea and claudication.  Gastrointestinal: Negative for abdominal pain, blood in stool, constipation, diarrhea, heartburn, melena, nausea and vomiting.  Genitourinary: Negative for dysuria, flank pain, frequency, hematuria and urgency.  Musculoskeletal: Negative for back pain, joint pain and myalgias.  Skin: Negative for rash.  Neurological: Negative for dizziness, tingling, focal weakness, seizures, weakness and headaches.  Endo/Heme/Allergies: Does not bruise/bleed easily.  Psychiatric/Behavioral: Negative for depression and suicidal ideas. The patient does not have insomnia.       Allergies  Allergen Reactions  . Tape Rash  . Gabapentin     Headache, "felt weird in the head"  . Sulfa Antibiotics   . Shellfish Allergy Rash     Past Medical History:  Diagnosis Date  . Cancer [facility]) July 2014   T2,N2a, Modified radical mastectomy. 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 Hypertension   . Malignant neoplasm of breast (female), unspecified site July 2014   Her case was presented at the [facility] tumor board in July 2014. Recommendations were for chest wall/peripheral lymphatic radiation if metastatic disease was not identified, anti-estrogen therapy only if additional metastatic disease was detected on a PET CT. No plans for adjuvant chemotherapy if  metastatic disease was identified was recommended.  . Sciatic pain 2015     Past Surgical History:  Procedure Laterality Date  . ABDOMINAL HYSTERECTOMY      . BREAST SURGERY Left 04-28-13   left mastectomy, SN biopsy  . EPIDURAL BLOCK INJECTION  2015   x3    Social History   Socioeconomic History  . Marital status: Married    Spouse name: Not on file  . Number of children: Not on file  . Years of education: Not on file  . Highest education level: Not on file  Occupational History  . Not on file  Social Needs  . Financial resource strain: Not on file  . Food insecurity:    Worry: Not on file    Inability: Not on file  . Transportation needs:    Medical: Not on file    Non-medical: Not on file  Tobacco Use  . Smoking status: Never Smoker  . Smokeless tobacco: Never Used  Substance and Sexual Activity  . Alcohol use: No  . Drug use: No  . Sexual activity: Not on file  Lifestyle  . Physical activity:    Days per week: Not on file    Minutes per session: Not on file  . Stress: Not on file  Relationships  . Social connections:    Talks on phone: Not on file    Gets together: Not on file    Attends religious service: Not on file    Active member of club or organization: Not on file    Attends meetings of clubs or organizations: Not on file    Relationship status: Not on file  . Intimate partner violence:    Fear of current or ex partner: Not on file    Emotionally abused: Not on file    Physically abused: Not on file    Forced sexual activity: Not on file  Other Topics Concern  . Not on file  Social History Narrative  . Not on file    Family History  Problem Relation Age of Onset  . Stroke Mother   . Heart disease Father   . Stroke Father   . Parkinson's disease Sister   . Parkinson's disease Brother      Current Outpatient Medications:  .  alendronate (FOSAMAX) 70 MG tablet, TAKE 1 TABLET BY MOUTH EVERY 7 DAYS. TAKE WITH A FULL GLASS OF WATER ON AN EMPTY STOMACH., Disp: 12 tablet, Rfl: 3 .  cetirizine (ZYRTEC) 10 MG tablet, Take 1 tablet (10 mg total) by mouth daily., Disp: 30 tablet, Rfl: 11 .   hydrochlorothiazide (HYDRODIURIL) 25 MG tablet, TAKE 1 TABLET BY MOUTH EVERY DAY, Disp: 90 tablet, Rfl: 3 .  letrozole (FEMARA) 2.5 MG tablet, Take 1 tablet (2.5 mg total) by mouth daily., Disp: 90 tablet, Rfl: 3 .  potassium chloride (MICRO-K) 10 MEQ CR capsule, TAKE 1 CAPSULE BY MOUTH EVERY DAY., Disp: 90 capsule, Rfl: 3 .  sertraline (ZOLOFT) 100 MG tablet, TAKE 1 TABLET BY MOUTH EVERY DAY, Disp: 30 tablet, Rfl: 11 .  acetaminophen (TYLENOL) 325 MG tablet, Take 650 mg by mouth every 6 (six) hours as needed., Disp: , Rfl:  .  b complex vitamins tablet, Take 1 tablet by mouth daily., Disp: , Rfl:  .  docusate sodium (COLACE) 100 MG capsule, Take 100 mg by mouth daily., Disp: , Rfl:  .  famotidine (PEPCID) 10 MG tablet, Take 10 mg by mouth daily as needed for heartburn or indigestion., Disp: ,  Rfl:  .  fluticasone (FLONASE) 50 MCG/ACT nasal spray, , Disp: , Rfl:  .  ibuprofen (ADVIL,MOTRIN) 100 MG tablet, Take 100 mg by mouth every 6 (six) hours as needed for fever., Disp: , Rfl:  .  naproxen sodium (ALEVE) 220 MG tablet, Take by mouth., Disp: , Rfl:  .  Saline 0.2 % SOLN, , Disp: , Rfl:   Physical exam:  Vitals:   03/24/18 1013  BP: (!) 112/53  Pulse: 74  Resp: 16  Temp: 97.7 F (36.5 C)  TempSrc: Oral  SpO2: 100%  Weight: 134 lb 11.2 oz (61.1 kg)  Height: 5' 1.02" (1.55 m)   Physical Exam  Constitutional: She is oriented to person, place, and time.  Thin frail elderly woman in no acute distress  HENT:  Head: Normocephalic and atraumatic.  Eyes: Pupils are equal, round, and reactive to light. EOM are normal.  Neck: Normal range of motion.  Cardiovascular: Normal rate, regular rhythm and normal heart sounds.  Pulmonary/Chest: Effort normal and breath sounds normal.  Abdominal: Soft. Bowel sounds are normal.  Neurological: She is alert and oriented to person, place, and time.  Skin: Skin is warm and dry.     CMP Latest Ref Rng & Units 02/27/2018  Glucose 65 - 99 mg/dL 120(H)    BUN 6 - 20 mg/dL 20  Creatinine 0.44 - 1.00 mg/dL 0.57  Sodium 135 - 145 mmol/L 133(L)  Potassium 3.5 - 5.1 mmol/L 3.1(L)  Chloride 101 - 111 mmol/L 97(L)  CO2 22 - 32 mmol/L 25  Calcium 8.9 - 10.3 mg/dL 9.4  Total Protein 6.1 - 8.1 g/dL -  Total Bilirubin 0.2 - 1.2 mg/dL -  Alkaline Phos 39 - 117 IU/L -  AST 10 - 35 U/L -  ALT 6 - 29 U/L -   CBC Latest Ref Rng & Units 03/24/2018  WBC 3.6 - 11.0 K/uL 28.9(H)  Hemoglobin 12.0 - 16.0 g/dL 7.5(L)  Hematocrit 35.0 - 47.0 % 23.1(L)  Platelets 150 - 440 K/uL 206    Assessment and plan- Patient is a 84 y.o. female with following issues:  1. Normocytic anemia. Hb was around 11 until 2017. Drifted down to 9 in dec 2018. Since then has been trending down and is now around 7. She needed prbc transfusion last month. Peripheral blood Anemia work up unremarkable. Given the worsenign anemia and worsening leucocytosis (see below)- I would recommend getting a bone marrow biopsy at this time to r/o MDS/ MPN or acute leukemia. Discussed risks and benefits of bone marrow biopsy including all but not limited to risk of bleeding and infection. Patient understands and agrees to proceed. We will arrange that to be done by IR this week  Will also check JAK2, CALR, MPL, BCR ABL fish and peripheral flow cytometry on the day she gets bone marrow biopsy. Will also send peripheral blood NGS panel for MDS  1 unit of prbc transfusion tomorrow for symptomatic anemia  2. Leucocytosis- gradually worsening. Differential mainly shows lymphocytosis, monocytosis and basophilia. Plan as above  I will see her back in 2 weeks time to discuss bone marrow biopsy results. Repeat cbc with diff on that day for possible blood transfusion  Patient is here with ehr son in law today and they are in understanding of the plan   Visit Diagnosis 1. Symptomatic anemia   2. Leukocytosis, unspecified type      Dr. Randa Evens, MD, MPH [facility] [phone] 03/24/2018 12:34 PM

## 2018-03-25 ENCOUNTER — Inpatient Hospital Stay: Payer: Medicare Other

## 2018-03-25 DIAGNOSIS — D649 Anemia, unspecified: Secondary | ICD-10-CM

## 2018-03-25 DIAGNOSIS — C9200 Acute myeloblastic leukemia, not having achieved remission: Secondary | ICD-10-CM | POA: Diagnosis not present

## 2018-03-25 DIAGNOSIS — Z853 Personal history of malignant neoplasm of breast: Secondary | ICD-10-CM | POA: Diagnosis not present

## 2018-03-25 DIAGNOSIS — I10 Essential (primary) hypertension: Secondary | ICD-10-CM | POA: Diagnosis not present

## 2018-03-25 DIAGNOSIS — D72829 Elevated white blood cell count, unspecified: Secondary | ICD-10-CM | POA: Diagnosis not present

## 2018-03-25 MED ORDER — SODIUM CHLORIDE 0.9 % IV SOLN
250.0000 mL | Freq: Once | INTRAVENOUS | Status: AC
  Administered 2018-03-25: 250 mL via INTRAVENOUS
  Filled 2018-03-25: qty 250

## 2018-03-25 MED ORDER — ACETAMINOPHEN 325 MG PO TABS
650.0000 mg | ORAL_TABLET | Freq: Once | ORAL | Status: AC
  Administered 2018-03-25: 650 mg via ORAL
  Filled 2018-03-25: qty 2

## 2018-03-25 NOTE — Patient Instructions (Signed)

## 2018-03-26 LAB — TYPE AND SCREEN
ABO/RH(D): A POS
Antibody Screen: NEGATIVE
UNIT DIVISION: 0

## 2018-03-26 LAB — BPAM RBC
BLOOD PRODUCT EXPIRATION DATE: 201906182359
ISSUE DATE / TIME: 201906111011
UNIT TYPE AND RH: 600

## 2018-03-30 ENCOUNTER — Other Ambulatory Visit: Payer: Self-pay | Admitting: *Deleted

## 2018-03-30 ENCOUNTER — Telehealth: Payer: Self-pay | Admitting: *Deleted

## 2018-03-30 DIAGNOSIS — D649 Anemia, unspecified: Secondary | ICD-10-CM

## 2018-03-30 DIAGNOSIS — D72829 Elevated white blood cell count, unspecified: Secondary | ICD-10-CM

## 2018-03-30 NOTE — Telephone Encounter (Signed)
I called pt's house on 6/14 and husband says that his wife is out and will not be back for a while and I told him I will call back later on another day. I called back today and let pt know about Bone marrow bx for 6/20 and that was the earliest we could get her in per specials. She was called Friday about the appt when she was home and she knew to be NPO, and have a driver and susan her daughter will bring her. I asked if she would like to come in tom and get blood checked to see if she may need blood this week and she is agreeable. I also told her that we want additional blood drawn at this time and she is agreeable/. Blood work 6/17 at 11 am and if she needs blood transfusion then she will be notified by me and it will be 6/18 at 11 am. She is ok with all above.

## 2018-03-31 ENCOUNTER — Inpatient Hospital Stay: Payer: Medicare Other

## 2018-03-31 DIAGNOSIS — D72829 Elevated white blood cell count, unspecified: Secondary | ICD-10-CM

## 2018-03-31 DIAGNOSIS — D649 Anemia, unspecified: Secondary | ICD-10-CM | POA: Diagnosis not present

## 2018-03-31 DIAGNOSIS — I10 Essential (primary) hypertension: Secondary | ICD-10-CM | POA: Diagnosis not present

## 2018-03-31 DIAGNOSIS — C9200 Acute myeloblastic leukemia, not having achieved remission: Secondary | ICD-10-CM | POA: Diagnosis not present

## 2018-03-31 DIAGNOSIS — Z853 Personal history of malignant neoplasm of breast: Secondary | ICD-10-CM | POA: Diagnosis not present

## 2018-03-31 LAB — CBC WITH DIFFERENTIAL/PLATELET
Band Neutrophils: 2 %
Basophils Absolute: 0 10*3/uL (ref 0–0.1)
Basophils Relative: 0 %
EOS ABS: 0 10*3/uL (ref 0–0.7)
Eosinophils Relative: 0 %
HCT: 28.7 % — ABNORMAL LOW (ref 35.0–47.0)
Hemoglobin: 9.4 g/dL — ABNORMAL LOW (ref 12.0–16.0)
LYMPHS PCT: 44 %
Lymphs Abs: 9.7 10*3/uL — ABNORMAL HIGH (ref 1.0–3.6)
MCH: 29.6 pg (ref 26.0–34.0)
MCHC: 32.6 g/dL (ref 32.0–36.0)
MCV: 90.7 fL (ref 80.0–100.0)
MONO ABS: 6.8 10*3/uL — AB (ref 0.2–0.9)
MONOS PCT: 31 %
NEUTROS ABS: 5.5 10*3/uL (ref 1.4–6.5)
Neutrophils Relative %: 23 %
PLATELETS: 168 10*3/uL (ref 150–440)
RBC: 3.16 MIL/uL — AB (ref 3.80–5.20)
RDW: 16.1 % — ABNORMAL HIGH (ref 11.5–14.5)
WBC: 22 10*3/uL — AB (ref 3.6–11.0)

## 2018-03-31 LAB — SAMPLE TO BLOOD BANK

## 2018-04-01 ENCOUNTER — Ambulatory Visit: Payer: Medicare Other | Admitting: Family Medicine

## 2018-04-02 ENCOUNTER — Other Ambulatory Visit: Payer: Self-pay | Admitting: Radiology

## 2018-04-02 LAB — COMP PANEL: LEUKEMIA/LYMPHOMA

## 2018-04-03 ENCOUNTER — Ambulatory Visit
Admission: RE | Admit: 2018-04-03 | Discharge: 2018-04-03 | Disposition: A | Discharge status: Home / Self Care | Payer: Medicare Other | Source: Ambulatory Visit | Attending: Oncology | Admitting: Oncology

## 2018-04-03 ENCOUNTER — Other Ambulatory Visit (HOSPITAL_COMMUNITY)
Admission: RE | Admit: 2018-04-03 | Disposition: A | Discharge status: Home / Self Care | Payer: Medicare Other | Source: Ambulatory Visit | Attending: Oncology | Admitting: Oncology

## 2018-04-03 DIAGNOSIS — C9200 Acute myeloblastic leukemia, not having achieved remission: Secondary | ICD-10-CM | POA: Diagnosis not present

## 2018-04-03 DIAGNOSIS — D72829 Elevated white blood cell count, unspecified: Secondary | ICD-10-CM

## 2018-04-03 DIAGNOSIS — D649 Anemia, unspecified: Secondary | ICD-10-CM

## 2018-04-03 HISTORY — DX: Unspecified osteoarthritis, unspecified site: M19.90

## 2018-04-03 HISTORY — DX: Chronic obstructive pulmonary disease, unspecified: J44.9

## 2018-04-03 HISTORY — DX: Age-related osteoporosis without current pathological fracture: M81.0

## 2018-04-03 HISTORY — DX: Other intervertebral disc degeneration, lumbar region without mention of lumbar back pain or lower extremity pain: M51.369

## 2018-04-03 HISTORY — DX: Spinal stenosis, site unspecified: M48.00

## 2018-04-03 HISTORY — DX: Other intervertebral disc degeneration, lumbar region: M51.36

## 2018-04-03 LAB — CBC WITH DIFFERENTIAL/PLATELET
Band Neutrophils: 1 %
Basophils Absolute: 0 10*3/uL (ref 0–0.1)
Basophils Relative: 0 %
Blasts: 0 %
EOS PCT: 1 %
Eosinophils Absolute: 0.2 10*3/uL (ref 0–0.7)
HEMATOCRIT: 27.1 % — AB (ref 35.0–47.0)
HEMOGLOBIN: 9 g/dL — AB (ref 12.0–16.0)
LYMPHS ABS: 10.7 10*3/uL — AB (ref 1.0–3.6)
LYMPHS PCT: 49 %
MCH: 30.1 pg (ref 26.0–34.0)
MCHC: 33.2 g/dL (ref 32.0–36.0)
MCV: 90.6 fL (ref 80.0–100.0)
MONOS PCT: 35 %
Metamyelocytes Relative: 0 %
Monocytes Absolute: 7.7 10*3/uL — ABNORMAL HIGH (ref 0.2–0.9)
Myelocytes: 0 %
NEUTROS ABS: 3.3 10*3/uL (ref 1.4–6.5)
NEUTROS PCT: 14 %
NRBC: 0 /100{WBCs}
Other: 0 %
Platelets: 160 10*3/uL (ref 150–440)
Promyelocytes Relative: 0 %
RBC: 2.99 MIL/uL — AB (ref 3.80–5.20)
RDW: 16.5 % — AB (ref 11.5–14.5)
WBC: 21.9 10*3/uL — AB (ref 3.6–11.0)

## 2018-04-03 LAB — PROTIME-INR
INR: 1.12
Prothrombin Time: 14.3 seconds (ref 11.4–15.2)

## 2018-04-03 MED ORDER — HEPARIN SOD (PORK) LOCK FLUSH 100 UNIT/ML IV SOLN
INTRAVENOUS | Status: AC
  Filled 2018-04-03: qty 5

## 2018-04-03 MED ORDER — HYDROCODONE-ACETAMINOPHEN 5-325 MG PO TABS: 1.0000 | ORAL_TABLET | ORAL | Status: DC | PRN

## 2018-04-03 MED ORDER — LIDOCAINE HCL (PF) 1 % IJ SOLN
INTRAMUSCULAR | Status: AC | PRN
  Administered 2018-04-03: 10 mL

## 2018-04-03 MED ORDER — MIDAZOLAM HCL 5 MG/5ML IJ SOLN
INTRAMUSCULAR | Status: AC | PRN
  Administered 2018-04-03 (×2): 0.5 mg via INTRAVENOUS

## 2018-04-03 MED ORDER — FENTANYL CITRATE (PF) 100 MCG/2ML IJ SOLN
INTRAMUSCULAR | Status: AC | PRN
  Administered 2018-04-03 (×2): 25 ug via INTRAVENOUS

## 2018-04-03 MED ORDER — FENTANYL CITRATE (PF) 100 MCG/2ML IJ SOLN
INTRAMUSCULAR | Status: AC
  Filled 2018-04-03: qty 4

## 2018-04-03 MED ORDER — MIDAZOLAM HCL 5 MG/5ML IJ SOLN
INTRAMUSCULAR | Status: AC
  Filled 2018-04-03: qty 5

## 2018-04-03 MED ORDER — SODIUM CHLORIDE 0.9 % IV SOLN
INTRAVENOUS | Status: DC
  Administered 2018-04-03: 09:00:00 via INTRAVENOUS

## 2018-04-03 NOTE — Procedures (Signed)
  Procedure: CT bone marrow biopsy R iliac EBL:   minimal Complications:  none immediate  See full dictation in [facility].  Dillard Cannon MD Main # [phone] Pager  [phone]

## 2018-04-04 ENCOUNTER — Telehealth: Payer: Self-pay | Admitting: *Deleted

## 2018-04-04 ENCOUNTER — Other Ambulatory Visit: Payer: Self-pay | Admitting: Otolaryngology

## 2018-04-04 DIAGNOSIS — R519 Headache, unspecified: Secondary | ICD-10-CM

## 2018-04-04 DIAGNOSIS — J0100 Acute maxillary sinusitis, unspecified: Secondary | ICD-10-CM | POA: Diagnosis not present

## 2018-04-04 DIAGNOSIS — R51 Headache: Secondary | ICD-10-CM

## 2018-04-04 DIAGNOSIS — K219 Gastro-esophageal reflux disease without esophagitis: Secondary | ICD-10-CM | POA: Diagnosis not present

## 2018-04-04 DIAGNOSIS — D649 Anemia, unspecified: Secondary | ICD-10-CM

## 2018-04-04 DIAGNOSIS — J029 Acute pharyngitis, unspecified: Secondary | ICD-10-CM

## 2018-04-04 DIAGNOSIS — R0982 Postnasal drip: Secondary | ICD-10-CM

## 2018-04-04 DIAGNOSIS — D72829 Elevated white blood cell count, unspecified: Secondary | ICD-10-CM

## 2018-04-04 DIAGNOSIS — J301 Allergic rhinitis due to pollen: Secondary | ICD-10-CM | POA: Diagnosis not present

## 2018-04-04 NOTE — Telephone Encounter (Signed)
Called pt's home and got her husband and  Told him that we need to see pt on monday in [place] to go over bone marrow results and husband states he will give patient the message because the patient is at ENT office about her allergies. The appt is 10:30 for labs and see md 10:45.

## 2018-04-07 ENCOUNTER — Inpatient Hospital Stay: Payer: Medicare Other

## 2018-04-07 ENCOUNTER — Telehealth: Payer: Self-pay | Admitting: *Deleted

## 2018-04-07 ENCOUNTER — Encounter: Payer: Self-pay | Admitting: Oncology

## 2018-04-07 ENCOUNTER — Inpatient Hospital Stay (HOSPITAL_BASED_OUTPATIENT_CLINIC_OR_DEPARTMENT_OTHER): Payer: Medicare Other | Admitting: Oncology

## 2018-04-07 VITALS — BP 116/73 | HR 80 | Temp 99.3°F | Resp 18 | Ht 61.02 in | Wt 133.7 lb

## 2018-04-07 DIAGNOSIS — D649 Anemia, unspecified: Secondary | ICD-10-CM

## 2018-04-07 DIAGNOSIS — I10 Essential (primary) hypertension: Secondary | ICD-10-CM | POA: Diagnosis not present

## 2018-04-07 DIAGNOSIS — D72829 Elevated white blood cell count, unspecified: Secondary | ICD-10-CM

## 2018-04-07 DIAGNOSIS — C9200 Acute myeloblastic leukemia, not having achieved remission: Secondary | ICD-10-CM

## 2018-04-07 DIAGNOSIS — Z853 Personal history of malignant neoplasm of breast: Secondary | ICD-10-CM | POA: Diagnosis not present

## 2018-04-07 DIAGNOSIS — Z7189 Other specified counseling: Secondary | ICD-10-CM

## 2018-04-07 LAB — CBC WITH DIFFERENTIAL/PLATELET
BASOS PCT: 4 %
Band Neutrophils: 5 %
Basophils Absolute: 0.8 10*3/uL — ABNORMAL HIGH (ref 0–0.1)
Blasts: 0 %
EOS ABS: 0.8 10*3/uL — AB (ref 0–0.7)
EOS PCT: 4 %
HCT: 24.6 % — ABNORMAL LOW (ref 35.0–47.0)
Hemoglobin: 8 g/dL — ABNORMAL LOW (ref 12.0–16.0)
Lymphocytes Relative: 43 %
Lymphs Abs: 9.2 10*3/uL — ABNORMAL HIGH (ref 1.0–3.6)
MCH: 29.3 pg (ref 26.0–34.0)
MCHC: 32.6 g/dL (ref 32.0–36.0)
MCV: 90.1 fL (ref 80.0–100.0)
METAMYELOCYTES PCT: 0 %
MONOS PCT: 30 %
Monocytes Absolute: 6.3 10*3/uL — ABNORMAL HIGH (ref 0.2–0.9)
Myelocytes: 0 %
NEUTROS ABS: 4 10*3/uL (ref 1.4–6.5)
Neutrophils Relative %: 14 %
Other: 0 %
PLATELETS: 168 10*3/uL (ref 150–440)
Promyelocytes Relative: 0 %
RBC: 2.73 MIL/uL — ABNORMAL LOW (ref 3.80–5.20)
RDW: 16.5 % — AB (ref 11.5–14.5)
WBC: 21.1 10*3/uL — ABNORMAL HIGH (ref 3.6–11.0)
nRBC: 1 /100 WBC — ABNORMAL HIGH

## 2018-04-07 LAB — SAMPLE TO BLOOD BANK

## 2018-04-07 NOTE — Progress Notes (Signed)
No new changes noted today 

## 2018-04-07 NOTE — Progress Notes (Signed)
Hematology/Oncology Consult note [facility]  [phone] [phone]  Patient Care Team: Jerrol Banana., MD as PCP - General (Family Medicine) Bary Castilla, Forest Gleason, MD as Consulting Physician (General Surgery) Rayetta Humphrey as Consulting Physician (Optometry)   Name of the patient: Crystal Carpenter  [id number]  10/27/1923   Date of visit: 04/07/18  Diagnosis- acute myeloid leukemia  Chief complaint/ Reason for visit- discuss results of bone marrow biopsy  Heme/Onc history: patient is a 84 year old female with a past medical history significant for breast cancer, hypercholesterolemia and hypertension among other medical problems. She has been referred to [place] for anemia. Recent CBC from 09/20/2017 showed white count of 5.6, H&H of 9.6/29.8 and a platelet count of 105. TSH was normal at 1.11. CMP was within normal limits. Serum iron was normal at 103. B12 levels were elevated at 1905 and folate was normal at greater than 24. Stool FOBT was negative.  Patient lives with her 45 year old husband and is independent of her ADLs and IADLs. She is still driving. She does feel fatigued on and off but today she reports feeling well. She denies any blood in her stool or urine.  Results of blood work from 10/21/2017 were as follows: CBC showed white count of 10.6, H&H of 10.1/31.9 with a platelet count of 210. Iron studies, haptoglobin and reticulocyte count was normal. Multiple myeloma panel showed no monoclonal protein and IFE was normal  Patients hb dropped down to 6.4 in may 2019. She received 2 units of prbc transfusion. She also had repeat anemia work up which was unremarkable. She then had progressive leucocytosis with wbc in 20's and left shift. She underwent bone marrow biopsy on 04/03/18. Noted to have 19% myeloid blasts in the marrow and 20% blasts in peripheral blood consistent with acute myeloid leukemia  Interval history- she  is still having issues with nasal congestion and saw Dr. Pryor Ochoa recently. She is on antibiotics and has a CT scan coming up in a couple of days. Other than that she feels well. Denies any complaints today  ECOG PS- 2 Pain scale- 0   Review of systems- Review of Systems  Constitutional: Positive for malaise/fatigue. Negative for chills, fever and weight loss.  HENT: Positive for congestion. Negative for ear discharge and nosebleeds.   Eyes: Negative for blurred vision.  Respiratory: Negative for cough, hemoptysis, sputum production, shortness of breath and wheezing.   Cardiovascular: Negative for chest pain, palpitations, orthopnea and claudication.  Gastrointestinal: Negative for abdominal pain, blood in stool, constipation, diarrhea, heartburn, melena, nausea and vomiting.  Genitourinary: Negative for dysuria, flank pain, frequency, hematuria and urgency.  Musculoskeletal: Negative for back pain, joint pain and myalgias.  Skin: Negative for rash.  Neurological: Negative for dizziness, tingling, focal weakness, seizures, weakness and headaches.  Endo/Heme/Allergies: Does not bruise/bleed easily.  Psychiatric/Behavioral: Negative for depression and suicidal ideas. The patient does not have insomnia.       Allergies  Allergen Reactions  . Tape Rash  . Gabapentin     Headache, "felt weird in the head"  . Sulfa Antibiotics   . Shellfish Allergy Rash     Past Medical History:  Diagnosis Date  . Arthritis   . Cancer [facility]) July 2014   T2,N2a, Modified radical mastectomy. 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 COPD (chronic obstructive pulmonary disease) ([place])   . Degenerative disc disease, lumbar   . Hypertension   . Malignant neoplasm of breast (  female), unspecified site July 2014   Her case was presented at the [facility] tumor board in July 2014. Recommendations were for chest wall/peripheral lymphatic radiation if metastatic disease was not identified,  anti-estrogen therapy only if additional metastatic disease was detected on a PET CT. No plans for adjuvant chemotherapy if metastatic disease was identified was recommended.  . Osteoporosis   . Sciatic pain 2015  . Spinal stenosis      Past Surgical History:  Procedure Laterality Date  . ABDOMINAL HYSTERECTOMY    . BREAST SURGERY Left 04-28-13   left mastectomy, SN biopsy  . EPIDURAL BLOCK INJECTION  2015   x3    Social History   Socioeconomic History  . Marital status: Married    Spouse name: Not on file  . Number of children: Not on file  . Years of education: Not on file  . Highest education level: Not on file  Occupational History  . Not on file  Social Needs  . Financial resource strain: Not on file  . Food insecurity:    Worry: Not on file    Inability: Not on file  . Transportation needs:    Medical: Not on file    Non-medical: Not on file  Tobacco Use  . Smoking status: Never Smoker  . Smokeless tobacco: Never Used  Substance and Sexual Activity  . Alcohol use: No  . Drug use: No  . Sexual activity: Not on file  Lifestyle  . Physical activity:    Days per week: Not on file    Minutes per session: Not on file  . Stress: Not on file  Relationships  . Social connections:    Talks on phone: Not on file    Gets together: Not on file    Attends religious service: Not on file    Active member of club or organization: Not on file    Attends meetings of clubs or organizations: Not on file    Relationship status: Not on file  . Intimate partner violence:    Fear of current or ex partner: Not on file    Emotionally abused: Not on file    Physically abused: Not on file    Forced sexual activity: Not on file  Other Topics Concern  . Not on file  Social History Narrative  . Not on file    Family History  Problem Relation Age of Onset  . Stroke Mother   . Heart disease Father   . Stroke Father   . Parkinson's disease Sister   . Parkinson's disease  Brother      Current Outpatient Medications:  .  alendronate (FOSAMAX) 70 MG tablet, TAKE 1 TABLET BY MOUTH EVERY 7 DAYS. TAKE WITH A FULL GLASS OF WATER ON AN EMPTY STOMACH., Disp: 12 tablet, Rfl: 3 .  azelastine (ASTELIN) 0.1 % nasal spray, Place into both nostrils 2 (two) times daily. Use in each nostril as directed, Disp: , Rfl:  .  b complex vitamins tablet, Take 1 tablet by mouth daily., Disp: , Rfl:  .  cetirizine (ZYRTEC) 10 MG tablet, Take 1 tablet (10 mg total) by mouth daily., Disp: 30 tablet, Rfl: 11 .  clindamycin (CLEOCIN) 300 MG capsule, Take 300 mg by mouth 3 (three) times daily., Disp: , Rfl:  .  docusate sodium (COLACE) 100 MG capsule, Take 100 mg by mouth daily., Disp: , Rfl:  .  famotidine (PEPCID) 10 MG tablet, Take 10 mg by mouth daily as needed for heartburn  or indigestion., Disp: , Rfl:  .  hydrochlorothiazide (HYDRODIURIL) 25 MG tablet, TAKE 1 TABLET BY MOUTH EVERY DAY, Disp: 90 tablet, Rfl: 3 .  ibuprofen (ADVIL,MOTRIN) 100 MG tablet, Take 100 mg by mouth every 6 (six) hours as needed for fever., Disp: , Rfl:  .  letrozole (FEMARA) 2.5 MG tablet, Take 1 tablet (2.5 mg total) by mouth daily., Disp: 90 tablet, Rfl: 3 .  potassium chloride (MICRO-K) 10 MEQ CR capsule, TAKE 1 CAPSULE BY MOUTH EVERY DAY., Disp: 90 capsule, Rfl: 3 .  sertraline (ZOLOFT) 100 MG tablet, TAKE 1 TABLET BY MOUTH EVERY DAY, Disp: 30 tablet, Rfl: 11 .  acetaminophen (TYLENOL) 325 MG tablet, Take 650 mg by mouth every 6 (six) hours as needed., Disp: , Rfl:  .  fluticasone (FLONASE) 50 MCG/ACT nasal spray, , Disp: , Rfl:  .  naproxen sodium (ALEVE) 220 MG tablet, Take by mouth., Disp: , Rfl:  .  Saline 0.2 % SOLN, , Disp: , Rfl:   Physical exam:  Vitals:   04/07/18 1046  BP: 116/73  Pulse: 80  Resp: 18  Temp: 99.3 F (37.4 C)  TempSrc: Tympanic  SpO2: 99%  Weight: 133 lb 11.3 oz (60.6 kg)  Height: 5' 1.02" (1.55 m)   Physical Exam  Constitutional: She is oriented to person, place, and  time.  Frail elderly woman in no acute distress  HENT:  Head: Normocephalic and atraumatic.  Eyes: Pupils are equal, round, and reactive to light. EOM are normal.  Neck: Normal range of motion.  Cardiovascular: Normal rate, regular rhythm and normal heart sounds.  Pulmonary/Chest: Effort normal and breath sounds normal.  Abdominal: Soft. Bowel sounds are normal.  Neurological: She is alert and oriented to person, place, and time.  Skin: Skin is warm and dry.     CMP Latest Ref Rng & Units 02/27/2018  Glucose 65 - 99 mg/dL 120(H)  BUN 6 - 20 mg/dL 20  Creatinine 0.44 - 1.00 mg/dL 0.57  Sodium 135 - 145 mmol/L 133(L)  Potassium 3.5 - 5.1 mmol/L 3.1(L)  Chloride 101 - 111 mmol/L 97(L)  CO2 22 - 32 mmol/L 25  Calcium 8.9 - 10.3 mg/dL 9.4  Total Protein 6.1 - 8.1 g/dL -  Total Bilirubin 0.2 - 1.2 mg/dL -  Alkaline Phos 39 - 117 IU/L -  AST 10 - 35 U/L -  ALT 6 - 29 U/L -   CBC Latest Ref Rng & Units 04/07/2018  WBC 3.6 - 11.0 K/uL 21.1(H)  Hemoglobin 12.0 - 16.0 g/dL 8.0(L)  Hematocrit 35.0 - 47.0 % 24.6(L)  Platelets 150 - 440 K/uL 168    No images are attached to the encounter.  Ct Bone Marrow Biopsy & Aspiration  Result Date: 04/03/2018 CLINICAL DATA:  Anemia of unknown etiology.  Leukocytosis. EXAM: CT GUIDED DEEP ILIAC BONE ASPIRATION AND CORE BIOPSY TECHNIQUE: Patient was placed supine on the CT gantry and limited axial scans through the pelvis were obtained. Appropriate skin entry site was identified. Skin site was marked, prepped with chlorhexidine, draped in usual sterile fashion, and infiltrated locally with 1% lidocaine. Intravenous Fentanyl and Versed were administered as conscious sedation during continuous monitoring of the patient's level of consciousness and physiological / cardiorespiratory status by the radiology RN, with a total moderate sedation time of 12 minutes. Under CT fluoroscopic guidance an 11-gauge Cook trocar bone needle was advanced into the right iliac  bone just lateral to the sacroiliac joint. Once needle tip position was confirmed, core and  aspiration samples were obtained, submitted to pathology for approval. Post procedure scans show no hematoma or fracture. Patient tolerated procedure well. COMPLICATIONS: COMPLICATIONS none IMPRESSION: 1. Technically successful CT guided right iliac bone core and aspiration biopsy. Electronically Signed   By: Lucrezia Europe M.D.   On: 04/03/2018 10:49     Assessment and plan- Patient is a 84 y.o. female here to discuss results of bone marrow biopsy:  Discussed bone marrow biopsy results which reveal 19% blasts in marrow and 20% blasts in peripheral blood consistent with AML. Cytogenetics and molecular studies are pending. There were also changes of MDS noted in her marrow. Given her age, performance status, MDS changes in her marrow- she likely has poor prognostic AML (until we get molecular studies and cytogenetics back).  At her age- she is not a candidate for induction chemotherapy and BMT.  Options at this time would be:  1.  treatment with hypomethylating agents like vidaza which can be given subcut 5 continuous days each month until progression or toxicity. OS reported in 1 study in patients >65 years was 12 months. However patient is 42 and may have more side effects from treatment as well. I explained to the patient the risks and benefits of chemotherapy including all but not limited to fatigue, leg edema,nausea, vomiting, low blood counts and risk of infection and hospitalization. Treatment will be palliative and not curative. Ultimately AML is a life ending condition.  2. Best supportive care/ hospice and without treatment her prognosis is likely in weeks  3. Supportive transfusions only which are unlikely to benefit in the long run and does not prolong survival  4. Second opinion at [facility]  Patient would like to think about her options and get back to me in a day or 2. I will see her back in 1 week  with cbc for possible transfusion and vidaza  Patients symptoms of nasal congestion likely due to impaired immunity from underlying AML. We will get in touch with Dr. Darien Ramus office to cancel her CT scan   Total face to face encounter time for this patient visit was 45 min. >50% of the time was  spent in counseling and coordination of care.      Visit Diagnosis 1. Acute myeloid leukemia not having achieved remission ([place])   2. Goals of care, counseling/discussion      Dr. Randa Evens, MD, MPH [facility] at [facility] [phone] 04/07/2018 1:15 PM

## 2018-04-07 NOTE — Telephone Encounter (Signed)
Called ENT office and spoke to Kathlee Nations and told her that we just got bone marrow bx results showing CML and the allergy sx she is experiencing is all about her cancer dx. Dr. Janese Banks rec: to not have ct scan. She did tell pt that was her recommendation also.  Patient to decide by end of week if she wants to move forward with vidaza treatment

## 2018-04-08 ENCOUNTER — Encounter: Payer: Self-pay | Admitting: Oncology

## 2018-04-08 LAB — BCR-ABL1 FISH
Cells Analyzed: 200
Cells Counted: 200

## 2018-04-09 ENCOUNTER — Telehealth: Payer: Self-pay | Admitting: *Deleted

## 2018-04-09 ENCOUNTER — Ambulatory Visit: Payer: Medicare Other

## 2018-04-09 ENCOUNTER — Other Ambulatory Visit: Payer: Self-pay | Admitting: Oncology

## 2018-04-09 ENCOUNTER — Other Ambulatory Visit: Payer: Self-pay | Admitting: *Deleted

## 2018-04-09 DIAGNOSIS — C9200 Acute myeloblastic leukemia, not having achieved remission: Secondary | ICD-10-CM | POA: Insufficient documentation

## 2018-04-09 DIAGNOSIS — D72829 Elevated white blood cell count, unspecified: Secondary | ICD-10-CM

## 2018-04-09 DIAGNOSIS — D649 Anemia, unspecified: Secondary | ICD-10-CM

## 2018-04-09 MED ORDER — LORAZEPAM 0.5 MG PO TABS
0.5000 mg | ORAL_TABLET | Freq: Four times a day (QID) | ORAL | 0 refills | Status: AC | PRN
Start: 2018-04-09 — End: ?

## 2018-04-09 MED ORDER — PROCHLORPERAZINE MALEATE 10 MG PO TABS: 10.0000 mg | ORAL_TABLET | Freq: Four times a day (QID) | ORAL | 1 refills | Status: AC | PRN

## 2018-04-09 MED ORDER — ONDANSETRON HCL 8 MG PO TABS: 8.0000 mg | ORAL_TABLET | Freq: Two times a day (BID) | ORAL | 1 refills | Status: AC | PRN

## 2018-04-09 NOTE — Telephone Encounter (Signed)
Called pt and asked her about the second opinion with [facility] Dr. Janene Madeira.  She feels that her and her family was in agreement with it. I told her that when Dr. Janese Banks was on telephone that he had opening on Thursday 6/28 at 1 pm. I will have to call his office back and see if they still have that date. Patient states that she understands and will talk to her daughter to be able to bring her. I spoke to daughter Manuela Schwartz and she is torn because she has chemo class tom. And the possible appt at [facility].  I spoke to Dr. Janese Banks who called Dr. Janene Madeira and the appt is still good for tom. 1 pm but must arrive 12 noon for labs and I sent all scanned records to Raelyn Ensign the new pt coordinator.  We will make the chemo education later in time and pt can go to the [facility] appt tom. Daughter and pt agreeable to the above. I sent directions to daughter today by email to [email or web address].

## 2018-04-09 NOTE — Patient Instructions (Signed)
Azacitidine suspension for injection (subcutaneous use) What is this medicine? AZACITIDINE (ay za SITE i deen) is a chemotherapy drug. This medicine reduces the growth of cancer cells and can suppress the immune system. It is used for treating myelodysplastic syndrome or some types of leukemia. This medicine may be used for other purposes; ask your health care provider or pharmacist if you have questions. COMMON BRAND NAME(S): Vidaza What should I tell my health care provider before I take this medicine? They need to know if you have any of these conditions: -kidney disease -liver disease -liver tumors -an unusual or allergic reaction to azacitidine, mannitol, other medicines, foods, dyes, or preservatives -pregnant or trying to get pregnant -breast-feeding How should I use this medicine? This medicine is for injection under the skin. It is administered in a hospital or clinic by a specially trained health care professional. Talk to your pediatrician regarding the use of this medicine in children. While this drug may be prescribed for selected conditions, precautions do apply. Overdosage: If you think you have taken too much of this medicine contact a poison control center or emergency room at once. NOTE: This medicine is only for you. Do not share this medicine with others. What if I miss a dose? It is important not to miss your dose. Call your doctor or health care professional if you are unable to keep an appointment. What may interact with this medicine? Interactions have not been studied. Give your health care provider a list of all the medicines, herbs, non-prescription drugs, or dietary supplements you use. Also tell them if you smoke, drink alcohol, or use illegal drugs. Some items may interact with your medicine. This list may not describe all possible interactions. Give your health care provider a list of all the medicines, herbs, non-prescription drugs, or dietary supplements you  use. Also tell them if you smoke, drink alcohol, or use illegal drugs. Some items may interact with your medicine. What should I watch for while using this medicine? Visit your doctor for checks on your progress. This drug may make you feel generally unwell. This is not uncommon, as chemotherapy can affect healthy cells as well as cancer cells. Report any side effects. Continue your course of treatment even though you feel ill unless your doctor tells you to stop. In some cases, you may be given additional medicines to help with side effects. Follow all directions for their use. Call your doctor or health care professional for advice if you get a fever, chills or sore throat, or other symptoms of a cold or flu. Do not treat yourself. This drug decreases your body's ability to fight infections. Try to avoid being around people who are sick. This medicine may increase your risk to bruise or bleed. Call your doctor or health care professional if you notice any unusual bleeding. You may need blood work done while you are taking this medicine. Do not become pregnant while taking this medicine and for 6 months after the last dose. Women should inform their doctor if they wish to become pregnant or think they might be pregnant. Men should not father a child while taking this medicine and for 3 months after the last dose. There is a potential for serious side effects to an unborn child. Talk to your health care professional or pharmacist for more information. Do not breast-feed an infant while taking this medicine and for 1 week after the last dose. This medicine may interfere with the ability to have a child.   Talk with your doctor or health care professional if you are concerned about your fertility. What side effects may I notice from receiving this medicine? Side effects that you should report to your doctor or health care professional as soon as possible: -allergic reactions like skin rash, itching or hives,  swelling of the face, lips, or tongue -low blood counts - this medicine may decrease the number of white blood cells, red blood cells and platelets. You may be at increased risk for infections and bleeding. -signs of infection - fever or chills, cough, sore throat, pain passing urine -signs of decreased platelets or bleeding - bruising, pinpoint red spots on the skin, black, tarry stools, blood in the urine -signs of decreased red blood cells - unusually weak or tired, fainting spells, lightheadedness -signs and symptoms of kidney injury like trouble passing urine or change in the amount of urine -signs and symptoms of liver injury like dark yellow or brown urine; general ill feeling or flu-like symptoms; light-colored stools; loss of appetite; nausea; right upper belly pain; unusually weak or tired; yellowing of the eyes or skin Side effects that usually do not require medical attention (report to your doctor or health care professional if they continue or are bothersome): -constipation -diarrhea -nausea, vomiting -pain or redness at the injection site -unusually weak or tired This list may not describe all possible side effects. Call your doctor for medical advice about side effects. You may report side effects to FDA at 1-800-FDA-1088. Where should I keep my medicine? This drug is given in a hospital or clinic and will not be stored at home. NOTE: This sheet is a summary. It may not cover all possible information. If you have questions about this medicine, talk to your doctor, pharmacist, or health care provider.  2018 Elsevier/Gold Standard (2016-10-30 14:37:51)  

## 2018-04-10 ENCOUNTER — Encounter (HOSPITAL_COMMUNITY): Payer: Self-pay | Admitting: Oncology

## 2018-04-10 ENCOUNTER — Inpatient Hospital Stay: Payer: Medicare Other

## 2018-04-10 DIAGNOSIS — C9200 Acute myeloblastic leukemia, not having achieved remission: Secondary | ICD-10-CM | POA: Diagnosis not present

## 2018-04-10 DIAGNOSIS — Z9010 Acquired absence of unspecified breast and nipple: Secondary | ICD-10-CM | POA: Diagnosis not present

## 2018-04-10 DIAGNOSIS — I10 Essential (primary) hypertension: Secondary | ICD-10-CM | POA: Diagnosis not present

## 2018-04-10 DIAGNOSIS — Z853 Personal history of malignant neoplasm of breast: Secondary | ICD-10-CM | POA: Diagnosis not present

## 2018-04-11 LAB — MISC LABCORP TEST (SEND OUT): Labcorp test code: 511834

## 2018-04-14 ENCOUNTER — Inpatient Hospital Stay: Payer: Medicare Other

## 2018-04-14 ENCOUNTER — Inpatient Hospital Stay: Payer: Medicare Other | Admitting: Oncology

## 2018-04-15 ENCOUNTER — Ambulatory Visit: Payer: Medicare Other

## 2018-04-16 ENCOUNTER — Inpatient Hospital Stay: Payer: Medicare Other | Attending: Oncology

## 2018-04-16 ENCOUNTER — Ambulatory Visit: Payer: Medicare Other

## 2018-04-16 DIAGNOSIS — D6959 Other secondary thrombocytopenia: Secondary | ICD-10-CM | POA: Insufficient documentation

## 2018-04-16 DIAGNOSIS — T451X5A Adverse effect of antineoplastic and immunosuppressive drugs, initial encounter: Secondary | ICD-10-CM | POA: Diagnosis not present

## 2018-04-16 DIAGNOSIS — C9200 Acute myeloblastic leukemia, not having achieved remission: Secondary | ICD-10-CM | POA: Diagnosis not present

## 2018-04-16 LAB — COMPREHENSIVE METABOLIC PANEL
ALBUMIN: 4 g/dL (ref 3.5–5.0)
ALK PHOS: 55 U/L (ref 38–126)
ALT: 21 U/L (ref 0–44)
ANION GAP: 11 (ref 5–15)
AST: 22 U/L (ref 15–41)
BILIRUBIN TOTAL: 0.8 mg/dL (ref 0.3–1.2)
BUN: 21 mg/dL (ref 8–23)
CO2: 25 mmol/L (ref 22–32)
Calcium: 9.6 mg/dL (ref 8.9–10.3)
Chloride: 98 mmol/L (ref 98–111)
Creatinine, Ser: 0.72 mg/dL (ref 0.44–1.00)
GFR calc Af Amer: >60 mL/min (ref >60)
GFR calc non Af Amer: >60 mL/min (ref >60)
Glucose, Bld: 121 mg/dL — ABNORMAL HIGH (ref 70–99)
POTASSIUM: 3.9 mmol/L (ref 3.5–5.1)
SODIUM: 134 mmol/L — AB (ref 135–145)
TOTAL PROTEIN: 6.5 g/dL (ref 6.5–8.1)

## 2018-04-16 LAB — PATHOLOGIST SMEAR REVIEW

## 2018-04-16 LAB — CBC WITH DIFFERENTIAL/PLATELET
BAND NEUTROPHILS: 2 %
BASOS ABS: 0 10*3/uL (ref 0–0.1)
BASOS PCT: 0 %
EOS ABS: 0.6 10*3/uL (ref 0–0.7)
Eosinophils Relative: 2 %
HEMATOCRIT: 28.2 % — AB (ref 35.0–47.0)
HEMOGLOBIN: 9.2 g/dL — AB (ref 12.0–16.0)
LYMPHS PCT: 28 %
Lymphs Abs: 8.2 10*3/uL — ABNORMAL HIGH (ref 1.0–3.6)
MCH: 29.4 pg (ref 26.0–34.0)
MCHC: 32.4 g/dL (ref 32.0–36.0)
MCV: 90.8 fL (ref 80.0–100.0)
MONOS PCT: 38 %
MYELOCYTES: 1 %
Monocytes Absolute: 11.1 10*3/uL — ABNORMAL HIGH (ref 0.2–0.9)
Neutro Abs: 5 10*3/uL (ref 1.4–6.5)
Neutrophils Relative %: 14 %
Other: 15 %
Platelets: 187 10*3/uL (ref 150–440)
RBC: 3.11 MIL/uL — ABNORMAL LOW (ref 3.80–5.20)
RDW: 15.4 % — ABNORMAL HIGH (ref 11.5–14.5)
WBC: 29.2 10*3/uL — ABNORMAL HIGH (ref 3.6–11.0)

## 2018-04-16 LAB — SAMPLE TO BLOOD BANK

## 2018-04-18 ENCOUNTER — Ambulatory Visit: Payer: Medicare Other

## 2018-04-21 ENCOUNTER — Inpatient Hospital Stay: Payer: Medicare Other

## 2018-04-21 ENCOUNTER — Telehealth: Payer: Self-pay

## 2018-04-21 ENCOUNTER — Inpatient Hospital Stay: Payer: Medicare Other | Admitting: Oncology

## 2018-04-21 DIAGNOSIS — C9200 Acute myeloblastic leukemia, not having achieved remission: Secondary | ICD-10-CM | POA: Diagnosis not present

## 2018-04-21 DIAGNOSIS — T451X5A Adverse effect of antineoplastic and immunosuppressive drugs, initial encounter: Secondary | ICD-10-CM | POA: Diagnosis not present

## 2018-04-21 DIAGNOSIS — D6959 Other secondary thrombocytopenia: Secondary | ICD-10-CM | POA: Diagnosis not present

## 2018-04-21 LAB — CBC WITH DIFFERENTIAL/PLATELET
BAND NEUTROPHILS: 3 %
BASOS PCT: 2 %
BLASTS: 15 %
Basophils Absolute: 0.5 10*3/uL — ABNORMAL HIGH (ref 0–0.1)
Eosinophils Absolute: 1.1 10*3/uL — ABNORMAL HIGH (ref 0–0.7)
Eosinophils Relative: 4 %
HEMATOCRIT: 25.6 % — AB (ref 35.0–47.0)
HEMOGLOBIN: 8.5 g/dL — AB (ref 12.0–16.0)
LYMPHS PCT: 28 %
Lymphs Abs: 7.6 10*3/uL — ABNORMAL HIGH (ref 1.0–3.6)
MCH: 29.7 pg (ref 26.0–34.0)
MCHC: 33.1 g/dL (ref 32.0–36.0)
MCV: 89.7 fL (ref 80.0–100.0)
MONOS PCT: 34 %
Metamyelocytes Relative: 1 %
Monocytes Absolute: 9.2 10*3/uL — ABNORMAL HIGH (ref 0.2–0.9)
Myelocytes: 1 %
NEUTROS ABS: 4.6 10*3/uL (ref 1.4–6.5)
NEUTROS PCT: 12 %
Platelets: 182 10*3/uL (ref 150–440)
RBC: 2.86 MIL/uL — ABNORMAL LOW (ref 3.80–5.20)
RDW: 15.4 % — AB (ref 11.5–14.5)
WBC: 27 10*3/uL — ABNORMAL HIGH (ref 3.6–11.0)

## 2018-04-21 LAB — BASIC METABOLIC PANEL
Anion gap: 12 (ref 5–15)
BUN: 21 mg/dL (ref 8–23)
CHLORIDE: 93 mmol/L — AB (ref 98–111)
CO2: 25 mmol/L (ref 22–32)
Calcium: 9.2 mg/dL (ref 8.9–10.3)
Creatinine, Ser: 0.67 mg/dL (ref 0.44–1.00)
GFR calc non Af Amer: >60 mL/min (ref >60)
GLUCOSE: 116 mg/dL — AB (ref 70–99)
POTASSIUM: 3.2 mmol/L — AB (ref 3.5–5.1)
Sodium: 130 mmol/L — ABNORMAL LOW (ref 135–145)

## 2018-04-21 NOTE — Telephone Encounter (Signed)
Spoke with Crystal Carpenter to inform her due to her receiving treatment at [facility] she will not need treatment her in [place], per Dr Janese Banks has approved for her to get labs done only today. The patient was agreeable and understanding.

## 2018-04-22 ENCOUNTER — Other Ambulatory Visit: Payer: Self-pay

## 2018-04-22 ENCOUNTER — Ambulatory Visit: Payer: Medicare Other

## 2018-04-22 DIAGNOSIS — Z1231 Encounter for screening mammogram for malignant neoplasm of breast: Secondary | ICD-10-CM

## 2018-04-23 ENCOUNTER — Ambulatory Visit: Payer: Medicare Other

## 2018-04-23 DIAGNOSIS — C9200 Acute myeloblastic leukemia, not having achieved remission: Secondary | ICD-10-CM | POA: Diagnosis not present

## 2018-04-23 DIAGNOSIS — Z5111 Encounter for antineoplastic chemotherapy: Secondary | ICD-10-CM | POA: Diagnosis not present

## 2018-04-23 LAB — MISC LABCORP TEST (SEND OUT): LABCORP TEST CODE: 451953

## 2018-04-24 ENCOUNTER — Ambulatory Visit: Payer: Medicare Other

## 2018-04-24 ENCOUNTER — Other Ambulatory Visit: Payer: Medicare Other

## 2018-04-24 DIAGNOSIS — C9200 Acute myeloblastic leukemia, not having achieved remission: Secondary | ICD-10-CM | POA: Diagnosis not present

## 2018-04-24 DIAGNOSIS — Z5111 Encounter for antineoplastic chemotherapy: Secondary | ICD-10-CM | POA: Diagnosis not present

## 2018-04-25 ENCOUNTER — Ambulatory Visit: Payer: Medicare Other

## 2018-04-25 DIAGNOSIS — Z5111 Encounter for antineoplastic chemotherapy: Secondary | ICD-10-CM | POA: Diagnosis not present

## 2018-04-25 DIAGNOSIS — C9590 Leukemia, unspecified not having achieved remission: Secondary | ICD-10-CM | POA: Diagnosis not present

## 2018-04-25 DIAGNOSIS — R21 Rash and other nonspecific skin eruption: Secondary | ICD-10-CM | POA: Diagnosis not present

## 2018-04-26 DIAGNOSIS — Z5111 Encounter for antineoplastic chemotherapy: Secondary | ICD-10-CM | POA: Diagnosis not present

## 2018-04-26 DIAGNOSIS — C9200 Acute myeloblastic leukemia, not having achieved remission: Secondary | ICD-10-CM | POA: Diagnosis not present

## 2018-04-27 DIAGNOSIS — C9200 Acute myeloblastic leukemia, not having achieved remission: Secondary | ICD-10-CM | POA: Diagnosis not present

## 2018-04-27 DIAGNOSIS — Z5111 Encounter for antineoplastic chemotherapy: Secondary | ICD-10-CM | POA: Diagnosis not present

## 2018-04-28 ENCOUNTER — Telehealth: Payer: Self-pay | Admitting: *Deleted

## 2018-04-28 ENCOUNTER — Other Ambulatory Visit: Payer: Self-pay

## 2018-04-28 DIAGNOSIS — C50912 Malignant neoplasm of unspecified site of left female breast: Secondary | ICD-10-CM

## 2018-04-28 DIAGNOSIS — Z5111 Encounter for antineoplastic chemotherapy: Secondary | ICD-10-CM | POA: Diagnosis not present

## 2018-04-28 DIAGNOSIS — C9200 Acute myeloblastic leukemia, not having achieved remission: Secondary | ICD-10-CM | POA: Diagnosis not present

## 2018-04-28 DIAGNOSIS — Z170 Estrogen receptor positive status [ER+]: Principal | ICD-10-CM

## 2018-04-28 NOTE — Telephone Encounter (Signed)
Can you please set this up for patient? Cbc with diff twice a week. Possible transfusion each time. Thanks, Presenter, broadcasting

## 2018-04-28 NOTE — Telephone Encounter (Signed)
[name] from [facility] would like to set up a bi-weekly lab and transfusion for this patient at [facility] for the patient's convenience. Call back number [phone]

## 2018-04-29 ENCOUNTER — Other Ambulatory Visit: Payer: Self-pay

## 2018-04-29 ENCOUNTER — Encounter (HOSPITAL_COMMUNITY): Payer: Self-pay | Admitting: Oncology

## 2018-04-29 DIAGNOSIS — Z5111 Encounter for antineoplastic chemotherapy: Secondary | ICD-10-CM | POA: Diagnosis not present

## 2018-04-29 DIAGNOSIS — C9200 Acute myeloblastic leukemia, not having achieved remission: Secondary | ICD-10-CM

## 2018-04-30 ENCOUNTER — Encounter: Payer: Self-pay | Admitting: Oncology

## 2018-04-30 ENCOUNTER — Telehealth: Payer: Self-pay | Admitting: *Deleted

## 2018-04-30 NOTE — Telephone Encounter (Signed)
Crystal Carpenter at [place] [facility] called and spoke to [place] [facility] about the need for patient to have blood work and [employer]. Blood transfusion on this Friday. Dr. Janese Carpenter saw message and since pt likes to go to [facility] office she has to get blood work one day and next day transfusion so she sent message to change labs to tom, and get blood Friday. Crystal Carpenter from [facility] called back to say that the blood work tom . Will be to soon and it all needs to be done on Friday. I spoke to Dr. Janese Carpenter on the phone and explained above and she said to do it like Crystal Carpenter wants as long as pt ok with that. I called pt and she is ok with the above on Friday in [place] with arrival time 8:45 for labs and pt will have someone bring her to [place] on Friday. Cbc and hold tube standing labs already ordered

## 2018-05-01 ENCOUNTER — Other Ambulatory Visit: Payer: Medicare Other

## 2018-05-02 ENCOUNTER — Encounter: Payer: Self-pay | Admitting: Oncology

## 2018-05-02 ENCOUNTER — Inpatient Hospital Stay: Payer: Medicare Other

## 2018-05-02 ENCOUNTER — Inpatient Hospital Stay (HOSPITAL_BASED_OUTPATIENT_CLINIC_OR_DEPARTMENT_OTHER): Payer: Medicare Other | Admitting: Oncology

## 2018-05-02 ENCOUNTER — Other Ambulatory Visit: Payer: Self-pay

## 2018-05-02 VITALS — BP 99/63 | HR 80 | Temp 98.4°F | Resp 18 | Ht 61.02 in | Wt 132.8 lb

## 2018-05-02 DIAGNOSIS — D6959 Other secondary thrombocytopenia: Secondary | ICD-10-CM

## 2018-05-02 DIAGNOSIS — T451X5A Adverse effect of antineoplastic and immunosuppressive drugs, initial encounter: Secondary | ICD-10-CM

## 2018-05-02 DIAGNOSIS — C9200 Acute myeloblastic leukemia, not having achieved remission: Secondary | ICD-10-CM

## 2018-05-02 LAB — CBC WITH DIFFERENTIAL/PLATELET
BASOS ABS: 0 10*3/uL (ref 0–0.1)
BASOS PCT: 1 %
Eosinophils Absolute: 0 10*3/uL (ref 0–0.7)
Eosinophils Relative: 0 %
HCT: 32 % — ABNORMAL LOW (ref 35.0–47.0)
HEMOGLOBIN: 10.9 g/dL — AB (ref 12.0–16.0)
LYMPHS PCT: 40 %
Lymphs Abs: 1.4 10*3/uL (ref 1.0–3.6)
MCH: 29.7 pg (ref 26.0–34.0)
MCHC: 34 g/dL (ref 32.0–36.0)
MCV: 87.4 fL (ref 80.0–100.0)
Monocytes Absolute: 1.3 10*3/uL — ABNORMAL HIGH (ref 0.2–0.9)
Monocytes Relative: 35 %
NEUTROS ABS: 0.9 10*3/uL — AB (ref 1.4–6.5)
NEUTROS PCT: 24 %
Platelets: 56 10*3/uL — ABNORMAL LOW (ref 150–400)
RBC: 3.66 MIL/uL — AB (ref 3.80–5.20)
RDW: 15.1 % — ABNORMAL HIGH (ref 11.5–14.5)
WBC: 3.6 10*3/uL (ref 3.6–11.0)

## 2018-05-02 LAB — SAMPLE TO BLOOD BANK

## 2018-05-02 NOTE — Progress Notes (Signed)
No c/o today. HA some days and take tylenol. Feels fatigue but better since she got blood on tues. At [facility]

## 2018-05-02 NOTE — Progress Notes (Signed)
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 She has been referred to [place] for anemia. Recent CBC from 09/20/2017 showed white count of 5.6, H&H of 9.6/29.8 and a platelet count of 105. TSH was normal at 1.11. CMP was within normal limits. Serum iron was normal at 103. B12 levels were elevated at 1905 and folate was normal at greater than 24. Stool FOBT was negative.  Patient lives with her 58 year old husband and is independent of her ADLs and IADLs. She is still driving. She does feel fatigued on and off but today she reports feeling well. She denies any blood in her stool or urine.  Results of blood work from 10/21/2017 were as follows: CBC showed white count of 10.6, H&H of 10.1/31.9 with a platelet count of 210. Iron studies, haptoglobin and reticulocyte count was normal. Multiple myeloma panel showed no monoclonal protein and IFE was normal  Patients hb dropped down to 6.4 in may 2019. She received 2 units of prbc transfusion. She also had repeat anemia work up which was unremarkable. She then had progressive leucocytosis with wbc in 20's and left shift. She underwent bone marrow biopsy on 04/03/18. Noted to have 19% myeloid blasts in the marrow and 20% blasts in peripheral blood consistent with acute myeloid leukemia  NGS panel showed NF1 but no other  mutations. She was seen by Dr. Janene Madeira at [facility]. She is curently on venetoclax. She is receiving [place] vidaza 7 days a month 1st dose was on 04/23/18   Interval history- reports some fatigue. Denies other complaints. She had some skin rash which improved after stopping allopurinol  ECOG PS- 1 Pain scale- 0 Opioid associated constipation- no  Review of systems- Review of Systems  Constitutional: Positive for malaise/fatigue. Negative for chills, fever and weight loss.  HENT: Negative for congestion, ear discharge and nosebleeds.   Eyes: Negative for blurred vision.  Respiratory: Negative for cough, hemoptysis, sputum production, shortness of breath and wheezing.   Cardiovascular: Negative for chest pain, palpitations, orthopnea and claudication.  Gastrointestinal: Negative for abdominal pain, blood in stool, constipation, diarrhea, heartburn, melena, nausea and vomiting.  Genitourinary: Negative for dysuria, flank pain, frequency, hematuria and urgency.  Musculoskeletal: Negative for back pain, joint pain and myalgias.  Skin: Negative for rash.  Neurological: Negative for dizziness, tingling, focal weakness, seizures, weakness and headaches.  Endo/Heme/Allergies: Does not bruise/bleed easily.  Psychiatric/Behavioral: Negative for depression and suicidal ideas. The patient does not have insomnia.       Allergies  Allergen Reactions  . Tape Rash  . Gabapentin     Headache, "felt weird in the head"  . Sulfa Antibiotics   . Shellfish Allergy Rash     Past Medical History:  Diagnosis Date  . Arthritis   . Cancer [facility]) July 2014   T2,N2a, Modified radical mastectomy. 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 COPD (chronic obstructive pulmonary disease) ([place])   . Degenerative  disc disease, lumbar   . Hypertension   . Malignant neoplasm of breast (female), unspecified site July 2014   Her case was presented at the [facility] tumor board in July 2014. Recommendations were for  chest wall/peripheral lymphatic radiation if metastatic disease was not identified, anti-estrogen therapy only if additional metastatic disease was detected on a PET CT. No plans for adjuvant chemotherapy if metastatic disease was identified was recommended.  . Osteoporosis   . Sciatic pain 2015  . Spinal stenosis      Past Surgical History:  Procedure Laterality Date  . ABDOMINAL HYSTERECTOMY    . BREAST SURGERY Left 04-28-13   left mastectomy, SN biopsy  . EPIDURAL BLOCK INJECTION  2015   x3    Social History   Socioeconomic History  . Marital status: Married    Spouse name: Not on file  . Number of children: Not on file  . Years of education: Not on file  . Highest education level: Not on file  Occupational History  . Not on file  Social Needs  . Financial resource strain: Not on file  . Food insecurity:    Worry: Not on file    Inability: Not on file  . Transportation needs:    Medical: Not on file    Non-medical: Not on file  Tobacco Use  . Smoking status: Never Smoker  . Smokeless tobacco: Never Used  Substance and Sexual Activity  . Alcohol use: No  . Drug use: No  . Sexual activity: Not on file  Lifestyle  . Physical activity:    Days per week: Not on file    Minutes per session: Not on file  . Stress: Not on file  Relationships  . Social connections:    Talks on phone: Not on file    Gets together: Not on file    Attends religious service: Not on file    Active member of club or organization: Not on file    Attends meetings of clubs or organizations: Not on file    Relationship status: Not on file  . Intimate partner violence:    Fear of current or ex partner: Not on file    Emotionally abused: Not on file    Physically abused: Not on file    Forced sexual activity: Not on file  Other Topics Concern  . Not on file  Social History Narrative  . Not on file    Family History  Problem Relation Age of Onset  . Stroke Mother   . Heart disease  Father   . Stroke Father   . Parkinson's disease Sister   . Parkinson's disease Brother      Current Outpatient Medications:  .  acetaminophen (TYLENOL) 325 MG tablet, Take 650 mg by mouth every 6 (six) hours as needed., Disp: , Rfl:  .  alendronate (FOSAMAX) 70 MG tablet, TAKE 1 TABLET BY MOUTH EVERY 7 DAYS. TAKE WITH A FULL GLASS OF WATER ON AN EMPTY STOMACH., Disp: 12 tablet, Rfl: 3 .  azelastine (ASTELIN) 0.1 % nasal spray, Place into both nostrils 2 (two) times daily. Use in each nostril as directed, Disp: , Rfl:  .  b complex vitamins tablet, Take 1 tablet by mouth daily., Disp: , Rfl:  .  docusate sodium (COLACE) 100 MG capsule, Take 100 mg by mouth daily., Disp: , Rfl:  .  famotidine (PEPCID) 10 MG tablet, Take 10 mg by mouth daily as needed for heartburn or indigestion., Disp: , Rfl:  .  fluticasone (FLONASE) 50 MCG/ACT nasal spray, Place 1 spray into both nostrils daily as needed. , Disp: , Rfl:  .  hydrochlorothiazide (HYDRODIURIL) 25 MG tablet, TAKE 1 TABLET BY MOUTH EVERY DAY, Disp: 90 tablet, Rfl: 3 .  ibuprofen (ADVIL,MOTRIN) 100 MG tablet, Take 100 mg by mouth every 6 (six) hours as needed for fever., Disp: , Rfl:  .  LORazepam (ATIVAN) 0.5 MG tablet, Take 1 tablet (0.5 mg total) by mouth every 6 (six) hours as needed (Nausea or vomiting)., Disp: 30 tablet, Rfl: 0 .  ondansetron (ZOFRAN) 8 MG tablet, Take 1 tablet (8 mg total) by mouth 2 (two) times daily as needed (Nausea or vomiting)., Disp: 30 tablet, Rfl: 1 .  potassium chloride (MICRO-K) 10 MEQ CR capsule, TAKE 1 CAPSULE BY MOUTH EVERY DAY., Disp: 90 capsule, Rfl: 3 .  prochlorperazine (COMPAZINE) 10 MG tablet, Take 1 tablet (10 mg total) by mouth every 6 (six) hours as needed (Nausea or vomiting)., Disp: 30 tablet, Rfl: 1 .  sertraline (ZOLOFT) 100 MG tablet, TAKE 1 TABLET BY MOUTH EVERY DAY, Disp: 30 tablet, Rfl: 11 .  cetirizine (ZYRTEC) 10 MG tablet, Take 1 tablet (10 mg total) by mouth daily., Disp: 30 tablet, Rfl:  11 .  clindamycin (CLEOCIN) 300 MG capsule, Take 300 mg by mouth 3 (three) times daily., Disp: , Rfl:  .  letrozole (FEMARA) 2.5 MG tablet, Take 1 tablet (2.5 mg total) by mouth daily., Disp: 90 tablet, Rfl: 3 .  naproxen sodium (ALEVE) 220 MG tablet, Take by mouth., Disp: , Rfl:  .  Saline 0.2 % SOLN, , Disp: , Rfl:   Physical exam:  Vitals:   05/02/18 0922  BP: 99/63  Pulse: 80  Resp: 18  Temp: 98.4 F (36.9 C)  TempSrc: Tympanic  Weight: 132 lb 12.8 oz (60.2 kg)  Height: 5' 1.02" (1.55 m)   Physical Exam  Constitutional: She is oriented to person, place, and time.  Thin elderly lady in no acute distress  HENT:  Head: Normocephalic and atraumatic.  Eyes: Pupils are equal, round, and reactive to light. EOM are normal.  Neck: Normal range of motion.  Cardiovascular: Normal rate, regular rhythm and normal heart sounds.  Pulmonary/Chest: Effort normal and breath sounds normal.  Abdominal: Soft. Bowel sounds are normal.  Neurological: She is alert and oriented to person, place, and time.  Skin: Skin is warm and dry.     CMP Latest Ref Rng & Units 04/21/2018  Glucose 70 - 99 mg/dL 116(H)  BUN 8 - 23 mg/dL 21  Creatinine 0.44 - 1.00 mg/dL 0.67  Sodium 135 - 145 mmol/L 130(L)  Potassium 3.5 - 5.1 mmol/L 3.2(L)  Chloride 98 - 111 mmol/L 93(L)  CO2 22 - 32 mmol/L 25  Calcium 8.9 - 10.3 mg/dL 9.2  Total Protein 6.5 - 8.1 g/dL -  Total Bilirubin 0.3 - 1.2 mg/dL -  Alkaline Phos 38 - 126 U/L -  AST 15 - 41 U/L -  ALT 0 - 44 U/L -   CBC Latest Ref Rng & Units 05/02/2018  WBC 3.6 - 11.0 K/uL 3.6  Hemoglobin 12.0 - 16.0 g/dL 10.9(L)  Hematocrit 35.0 - 47.0 % 32.0(L)  Platelets 150 - 400 K/uL 56(L)    No images are attached to the encounter.  Ct Bone Marrow Biopsy & Aspiration  Result Date: 04/03/2018 CLINICAL DATA:  Anemia of unknown etiology.  Leukocytosis. EXAM: CT GUIDED DEEP ILIAC BONE ASPIRATION AND CORE BIOPSY TECHNIQUE: Patient was placed supine on  the CT gantry and  limited axial scans through the pelvis were obtained. Appropriate skin entry site was identified. Skin site was marked, prepped with chlorhexidine, draped in usual sterile fashion, and infiltrated locally with 1% lidocaine. Intravenous Fentanyl and Versed were administered as conscious sedation during continuous monitoring of the patient's level of consciousness and physiological / cardiorespiratory status by the radiology RN, with a total moderate sedation time of 12 minutes. Under CT fluoroscopic guidance an 11-gauge Cook trocar bone needle was advanced into the right iliac bone just lateral to the sacroiliac joint. Once needle tip position was confirmed, core and aspiration samples were obtained, submitted to pathology for approval. Post procedure scans show no hematoma or fracture. Patient tolerated procedure well. COMPLICATIONS: COMPLICATIONS none IMPRESSION: 1. Technically successful CT guided right iliac bone core and aspiration biopsy. Electronically Signed   By: Lucrezia Europe M.D.   On: 04/03/2018 10:49     Assessment and plan- Patient is a 84 y.o. female with newly diagnosed AML on venetoclax/ vidaza  Patient will continue venetoclax 400 mg daily. She will be getting repeat bone marrow biopsy on 05/12/18 at [facility]  Her next cycle of vidaza is due on 05/21/18 which she will likely get at [facility]  She will continue to get cbc/cmp twice weekly at [facility] on mon/ Thursday for possible transfusion Tuesday/ Friday. She received blood transfusion earlier this week. No need for transfusion today.  I will see her on 05/26/18  At [facility] when she starts back on cycle 2 of vidaza   Visit Diagnosis 1. Acute myeloid leukemia not having achieved remission ([place])   2. Chemotherapy-induced thrombocytopenia      Dr. Randa Evens, MD, MPH [facility] at [facility] [phone] 05/02/2018 1:04 PM

## 2018-05-05 ENCOUNTER — Inpatient Hospital Stay: Payer: Medicare Other

## 2018-05-05 ENCOUNTER — Ambulatory Visit: Payer: Medicare Other | Admitting: Oncology

## 2018-05-05 ENCOUNTER — Telehealth: Payer: Self-pay | Admitting: *Deleted

## 2018-05-05 ENCOUNTER — Other Ambulatory Visit: Payer: Medicare Other

## 2018-05-05 ENCOUNTER — Inpatient Hospital Stay: Payer: Medicare Other | Admitting: Oncology

## 2018-05-05 ENCOUNTER — Other Ambulatory Visit: Payer: Self-pay | Admitting: Oncology

## 2018-05-05 DIAGNOSIS — C9200 Acute myeloblastic leukemia, not having achieved remission: Secondary | ICD-10-CM | POA: Diagnosis not present

## 2018-05-05 DIAGNOSIS — D6959 Other secondary thrombocytopenia: Secondary | ICD-10-CM | POA: Diagnosis not present

## 2018-05-05 DIAGNOSIS — T451X5A Adverse effect of antineoplastic and immunosuppressive drugs, initial encounter: Secondary | ICD-10-CM | POA: Diagnosis not present

## 2018-05-05 LAB — CBC WITH DIFFERENTIAL/PLATELET
Basophils Absolute: 0 10*3/uL (ref 0–0.1)
Basophils Relative: 1 %
EOS ABS: 0 10*3/uL (ref 0–0.7)
EOS PCT: 0 %
HCT: 30.2 % — ABNORMAL LOW (ref 35.0–47.0)
Hemoglobin: 10.2 g/dL — ABNORMAL LOW (ref 12.0–16.0)
LYMPHS ABS: 1.2 10*3/uL (ref 1.0–3.6)
Lymphocytes Relative: 39 %
MCH: 29.2 pg (ref 26.0–34.0)
MCHC: 33.8 g/dL (ref 32.0–36.0)
MCV: 86.4 fL (ref 80.0–100.0)
MONO ABS: 1.4 10*3/uL — AB (ref 0.2–0.9)
Monocytes Relative: 48 %
Neutro Abs: 0.4 10*3/uL — ABNORMAL LOW (ref 1.4–6.5)
Neutrophils Relative %: 12 %
PLATELETS: 29 10*3/uL — AB (ref 150–440)
RBC: 3.5 MIL/uL — AB (ref 3.80–5.20)
RDW: 14.2 % (ref 11.5–14.5)
WBC: 3 10*3/uL — ABNORMAL LOW (ref 3.6–11.0)

## 2018-05-05 LAB — COMPREHENSIVE METABOLIC PANEL
ALT: 21 U/L (ref 0–44)
AST: 21 U/L (ref 15–41)
Albumin: 4 g/dL (ref 3.5–5.0)
Alkaline Phosphatase: 55 U/L (ref 38–126)
Anion gap: 12 (ref 5–15)
BILIRUBIN TOTAL: 0.7 mg/dL (ref 0.3–1.2)
BUN: 19 mg/dL (ref 8–23)
CHLORIDE: 91 mmol/L — AB (ref 98–111)
CO2: 24 mmol/L (ref 22–32)
CREATININE: 0.66 mg/dL (ref 0.44–1.00)
Calcium: 9.3 mg/dL (ref 8.9–10.3)
GFR calc Af Amer: >60 mL/min (ref >60)
GLUCOSE: 125 mg/dL — AB (ref 70–99)
Potassium: 3.1 mmol/L — ABNORMAL LOW (ref 3.5–5.1)
Sodium: 127 mmol/L — ABNORMAL LOW (ref 135–145)
TOTAL PROTEIN: 6.3 g/dL — AB (ref 6.5–8.1)

## 2018-05-05 LAB — SAMPLE TO BLOOD BANK

## 2018-05-05 MED ORDER — LEVOFLOXACIN 500 MG PO TABS: 500.0000 mg | ORAL_TABLET | Freq: Every day | ORAL | 0 refills | Status: DC

## 2018-05-05 NOTE — Progress Notes (Signed)
cbc

## 2018-05-05 NOTE — Telephone Encounter (Signed)
Crystal Carpenter from [facility] main lab called with critical lab results. Platelets 29. Hgb is 10.2, HCT 30.2. Dr Janese Banks notified

## 2018-05-05 NOTE — Progress Notes (Unsigned)
Patient developed a "terrible rash" on legs and back after starting allopurinol. The folks at [facility] told her to stop the allopurinol. The rash is gradually improving. They gave her some amazing ointment at [facility] to use but she doesn't know the name of the cream. She wanted Dr Janese Banks to see the rash.

## 2018-05-05 NOTE — Telephone Encounter (Signed)
Pt had came in for labs and possible blood for tom. She has a rash and it is getting better but Dr. Janese Banks looked at it and it is better. Her potassium and na is low. Dr. Janese Banks asked her if she would like fluids and she does not want them today. She will sprinkle some salt on her food when she eats for the next few days. She is already on potassium 10 meq and she will increase to 2 pills a day. Dr. Janese Banks got a text from  Dr. Janene Madeira said that because her wbc is low to put her on levaquin so I have sent levaquin 500 mg into her pharmacy.

## 2018-05-06 ENCOUNTER — Inpatient Hospital Stay: Payer: Medicare Other

## 2018-05-07 ENCOUNTER — Telehealth: Payer: Self-pay | Admitting: *Deleted

## 2018-05-07 NOTE — Telephone Encounter (Signed)
Patient called reporting that she is unable to take the Levaquin it is causing headache and inability to sleep. She is asking [place] to order something else for her. Please advise

## 2018-05-08 ENCOUNTER — Telehealth: Payer: Self-pay | Admitting: *Deleted

## 2018-05-08 ENCOUNTER — Other Ambulatory Visit: Payer: Self-pay | Admitting: *Deleted

## 2018-05-08 ENCOUNTER — Inpatient Hospital Stay: Payer: Medicare Other

## 2018-05-08 ENCOUNTER — Other Ambulatory Visit: Payer: Self-pay | Admitting: Oncology

## 2018-05-08 DIAGNOSIS — D696 Thrombocytopenia, unspecified: Secondary | ICD-10-CM

## 2018-05-08 DIAGNOSIS — T451X5A Adverse effect of antineoplastic and immunosuppressive drugs, initial encounter: Secondary | ICD-10-CM | POA: Diagnosis not present

## 2018-05-08 DIAGNOSIS — C9200 Acute myeloblastic leukemia, not having achieved remission: Secondary | ICD-10-CM

## 2018-05-08 DIAGNOSIS — D6959 Other secondary thrombocytopenia: Secondary | ICD-10-CM | POA: Diagnosis not present

## 2018-05-08 LAB — CBC WITH DIFFERENTIAL/PLATELET
BASOS ABS: 0 10*3/uL (ref 0–0.1)
BASOS PCT: 1 %
EOS ABS: 0 10*3/uL (ref 0–0.7)
EOS PCT: 0 %
HEMATOCRIT: 27.1 % — AB (ref 35.0–47.0)
Hemoglobin: 9.4 g/dL — ABNORMAL LOW (ref 12.0–16.0)
Lymphocytes Relative: 36 %
Lymphs Abs: 1 10*3/uL (ref 1.0–3.6)
MCH: 29.7 pg (ref 26.0–34.0)
MCHC: 34.6 g/dL (ref 32.0–36.0)
MCV: 85.7 fL (ref 80.0–100.0)
MONO ABS: 1.4 10*3/uL — AB (ref 0.2–0.9)
MONOS PCT: 50 %
NEUTROS ABS: 0.3 10*3/uL — AB (ref 1.4–6.5)
Neutrophils Relative %: 13 %
PLATELETS: 13 10*3/uL — AB (ref 150–440)
RBC: 3.17 MIL/uL — ABNORMAL LOW (ref 3.80–5.20)
RDW: 14.4 % (ref 11.5–14.5)
WBC: 2.8 10*3/uL — ABNORMAL LOW (ref 3.6–11.0)

## 2018-05-08 LAB — COMPREHENSIVE METABOLIC PANEL
ALK PHOS: 59 U/L (ref 38–126)
ALT: 19 U/L (ref 0–44)
ANION GAP: 11 (ref 5–15)
AST: 17 U/L (ref 15–41)
Albumin: 3.9 g/dL (ref 3.5–5.0)
BILIRUBIN TOTAL: 0.8 mg/dL (ref 0.3–1.2)
BUN: 20 mg/dL (ref 8–23)
CALCIUM: 9.4 mg/dL (ref 8.9–10.3)
CO2: 26 mmol/L (ref 22–32)
CREATININE: 0.63 mg/dL (ref 0.44–1.00)
Chloride: 92 mmol/L — ABNORMAL LOW (ref 98–111)
GFR calc Af Amer: >60 mL/min (ref >60)
GFR calc non Af Amer: >60 mL/min (ref >60)
GLUCOSE: 123 mg/dL — AB (ref 70–99)
Potassium: 3.3 mmol/L — ABNORMAL LOW (ref 3.5–5.1)
Sodium: 129 mmol/L — ABNORMAL LOW (ref 135–145)
TOTAL PROTEIN: 6.4 g/dL — AB (ref 6.5–8.1)

## 2018-05-08 LAB — SAMPLE TO BLOOD BANK

## 2018-05-08 MED ORDER — CIPROFLOXACIN HCL 500 MG PO TABS: 500.0000 mg | ORAL_TABLET | Freq: Two times a day (BID) | ORAL | 0 refills | Status: DC

## 2018-05-08 NOTE — Telephone Encounter (Signed)
We can prescribe cipro 500 mg bid instead and see if she tolerates that better.

## 2018-05-08 NOTE — Telephone Encounter (Signed)
Patient husband advised of new prescription sent

## 2018-05-09 ENCOUNTER — Ambulatory Visit: Payer: Medicare Other

## 2018-05-09 ENCOUNTER — Inpatient Hospital Stay: Payer: Medicare Other

## 2018-05-09 DIAGNOSIS — D696 Thrombocytopenia, unspecified: Secondary | ICD-10-CM

## 2018-05-09 DIAGNOSIS — D6959 Other secondary thrombocytopenia: Secondary | ICD-10-CM | POA: Diagnosis not present

## 2018-05-09 DIAGNOSIS — T451X5A Adverse effect of antineoplastic and immunosuppressive drugs, initial encounter: Secondary | ICD-10-CM | POA: Diagnosis not present

## 2018-05-09 DIAGNOSIS — C9200 Acute myeloblastic leukemia, not having achieved remission: Secondary | ICD-10-CM | POA: Diagnosis not present

## 2018-05-09 MED ORDER — ACETAMINOPHEN 325 MG PO TABS
650.0000 mg | ORAL_TABLET | Freq: Once | ORAL | Status: AC
  Administered 2018-05-09: 650 mg via ORAL
  Filled 2018-05-09: qty 2

## 2018-05-09 MED ORDER — ACETAMINOPHEN 325 MG PO TABS: 650.0000 mg | ORAL_TABLET | Freq: Once | ORAL | 0 refills | Status: AC

## 2018-05-09 MED ORDER — SODIUM CHLORIDE 0.9% IV SOLUTION
250.0000 mL | Freq: Once | INTRAVENOUS | Status: AC
  Administered 2018-05-09: 250 mL via INTRAVENOUS
  Filled 2018-05-09: qty 250

## 2018-05-10 LAB — PREPARE PLATELET PHERESIS: UNIT DIVISION: 0

## 2018-05-10 LAB — BPAM PLATELET PHERESIS
BLOOD PRODUCT EXPIRATION DATE: 201907262359
ISSUE DATE / TIME: 201907261108
UNIT TYPE AND RH: 5100

## 2018-05-12 ENCOUNTER — Other Ambulatory Visit: Payer: Medicare Other

## 2018-05-12 DIAGNOSIS — C9200 Acute myeloblastic leukemia, not having achieved remission: Secondary | ICD-10-CM | POA: Diagnosis not present

## 2018-05-13 ENCOUNTER — Ambulatory Visit: Payer: Medicare Other

## 2018-05-15 ENCOUNTER — Other Ambulatory Visit: Payer: Medicare Other

## 2018-05-15 DIAGNOSIS — C7951 Secondary malignant neoplasm of bone: Secondary | ICD-10-CM | POA: Diagnosis not present

## 2018-05-15 DIAGNOSIS — Z9221 Personal history of antineoplastic chemotherapy: Secondary | ICD-10-CM | POA: Diagnosis not present

## 2018-05-15 DIAGNOSIS — C9200 Acute myeloblastic leukemia, not having achieved remission: Secondary | ICD-10-CM | POA: Diagnosis not present

## 2018-05-15 DIAGNOSIS — D469 Myelodysplastic syndrome, unspecified: Secondary | ICD-10-CM | POA: Diagnosis not present

## 2018-05-15 DIAGNOSIS — R110 Nausea: Secondary | ICD-10-CM | POA: Diagnosis not present

## 2018-05-15 DIAGNOSIS — E871 Hypo-osmolality and hyponatremia: Secondary | ICD-10-CM | POA: Diagnosis not present

## 2018-05-16 ENCOUNTER — Ambulatory Visit: Payer: Medicare Other

## 2018-05-19 ENCOUNTER — Other Ambulatory Visit: Payer: Self-pay

## 2018-05-19 ENCOUNTER — Inpatient Hospital Stay: Payer: Medicare Other | Attending: Oncology

## 2018-05-19 ENCOUNTER — Other Ambulatory Visit: Payer: Medicare Other

## 2018-05-19 ENCOUNTER — Telehealth: Payer: Self-pay | Admitting: *Deleted

## 2018-05-19 DIAGNOSIS — C9200 Acute myeloblastic leukemia, not having achieved remission: Secondary | ICD-10-CM | POA: Diagnosis not present

## 2018-05-19 DIAGNOSIS — Z5111 Encounter for antineoplastic chemotherapy: Secondary | ICD-10-CM | POA: Insufficient documentation

## 2018-05-19 LAB — COMPREHENSIVE METABOLIC PANEL
ALBUMIN: 3.8 g/dL (ref 3.5–5.0)
ALK PHOS: 64 U/L (ref 38–126)
ALT: 15 U/L (ref 0–44)
AST: 15 U/L (ref 15–41)
Anion gap: 8 (ref 5–15)
BILIRUBIN TOTAL: 0.9 mg/dL (ref 0.3–1.2)
BUN: 11 mg/dL (ref 8–23)
CALCIUM: 9.7 mg/dL (ref 8.9–10.3)
CO2: 24 mmol/L (ref 22–32)
Chloride: 99 mmol/L (ref 98–111)
Creatinine, Ser: 0.59 mg/dL (ref 0.44–1.00)
GFR calc Af Amer: >60 mL/min (ref >60)
GFR calc non Af Amer: >60 mL/min (ref >60)
GLUCOSE: 120 mg/dL — AB (ref 70–99)
Potassium: 3.8 mmol/L (ref 3.5–5.1)
Sodium: 131 mmol/L — ABNORMAL LOW (ref 135–145)
TOTAL PROTEIN: 6.7 g/dL (ref 6.5–8.1)

## 2018-05-19 LAB — CBC WITH DIFFERENTIAL/PLATELET
BASOS PCT: 1 %
Basophils Absolute: 0 10*3/uL (ref 0–0.1)
Blasts: 1 %
Eosinophils Absolute: 0 10*3/uL (ref 0–0.7)
Eosinophils Relative: 0 %
HCT: 27.7 % — ABNORMAL LOW (ref 35.0–47.0)
HEMOGLOBIN: 9.4 g/dL — AB (ref 12.0–16.0)
LYMPHS ABS: 1.2 10*3/uL (ref 1.0–3.6)
LYMPHS PCT: 53 %
MCH: 29.5 pg (ref 26.0–34.0)
MCHC: 33.9 g/dL (ref 32.0–36.0)
MCV: 87.1 fL (ref 80.0–100.0)
MONO ABS: 0.7 10*3/uL (ref 0.2–0.9)
Monocytes Relative: 30 %
NEUTROS ABS: 0.3 10*3/uL — AB (ref 1.7–7.7)
NEUTROS PCT: 15 %
Platelets: 21 10*3/uL — CL (ref 150–400)
RBC: 3.18 MIL/uL — AB (ref 3.80–5.20)
RDW: 14.5 % (ref 11.5–14.5)
WBC: 2.3 10*3/uL — AB (ref 3.6–11.0)
nRBC: 1 /100 WBC — ABNORMAL HIGH

## 2018-05-19 LAB — SAMPLE TO BLOOD BANK

## 2018-05-19 MED ORDER — MAGIC MOUTHWASH: 5.0000 mL | Freq: Four times a day (QID) | ORAL | 0 refills | Status: AC | PRN

## 2018-05-19 NOTE — Telephone Encounter (Signed)
Called to tell pt she does not need blood tom. Cancelled the appt and pt ok with that. She states that she has ulcer in her mouth and it is hard to get food in because of it and has prescription mouthwash but  It has not helped and she thinks it is because her anc is down and she is correct. We called in [facility] and gave her instructions how to use it. Pt agreeable to above

## 2018-05-20 ENCOUNTER — Inpatient Hospital Stay: Payer: Medicare Other

## 2018-05-20 ENCOUNTER — Ambulatory Visit: Payer: Medicare Other

## 2018-05-21 ENCOUNTER — Inpatient Hospital Stay: Payer: Medicare Other

## 2018-05-21 ENCOUNTER — Other Ambulatory Visit: Payer: Self-pay | Admitting: Oncology

## 2018-05-21 ENCOUNTER — Ambulatory Visit: Payer: Medicare Other

## 2018-05-21 VITALS — BP 128/68 | HR 77 | Temp 98.7°F | Resp 16 | Wt 127.8 lb

## 2018-05-21 DIAGNOSIS — C9200 Acute myeloblastic leukemia, not having achieved remission: Secondary | ICD-10-CM | POA: Diagnosis not present

## 2018-05-21 DIAGNOSIS — Z5111 Encounter for antineoplastic chemotherapy: Secondary | ICD-10-CM | POA: Diagnosis not present

## 2018-05-21 MED ORDER — AZACITIDINE CHEMO SQ INJECTION
75.0000 mg/m2 | Freq: Once | INTRAMUSCULAR | Status: AC
  Administered 2018-05-21: 122.5 mg via SUBCUTANEOUS
  Filled 2018-05-21: qty 4.9

## 2018-05-21 MED ORDER — ONDANSETRON HCL 4 MG PO TABS
8.0000 mg | ORAL_TABLET | Freq: Once | ORAL | Status: AC
  Administered 2018-05-21: 8 mg via ORAL
  Filled 2018-05-21: qty 2

## 2018-05-21 NOTE — Progress Notes (Signed)
Labs from 05/19/18 reviewed with MD, Dr. Janese Banks. Per MD order: proceed with Vidaza treatment as scheduled today.

## 2018-05-22 ENCOUNTER — Inpatient Hospital Stay (HOSPITAL_BASED_OUTPATIENT_CLINIC_OR_DEPARTMENT_OTHER): Payer: Medicare Other | Admitting: Oncology

## 2018-05-22 ENCOUNTER — Inpatient Hospital Stay: Payer: Medicare Other

## 2018-05-22 ENCOUNTER — Ambulatory Visit: Payer: Medicare Other | Admitting: General Surgery

## 2018-05-22 ENCOUNTER — Encounter: Payer: Self-pay | Admitting: Oncology

## 2018-05-22 ENCOUNTER — Ambulatory Visit: Payer: Medicare Other

## 2018-05-22 ENCOUNTER — Other Ambulatory Visit: Payer: Medicare Other

## 2018-05-22 VITALS — BP 114/69 | HR 79 | Temp 97.3°F | Resp 18 | Ht 61.02 in | Wt 132.8 lb

## 2018-05-22 DIAGNOSIS — I10 Essential (primary) hypertension: Secondary | ICD-10-CM

## 2018-05-22 DIAGNOSIS — C9200 Acute myeloblastic leukemia, not having achieved remission: Secondary | ICD-10-CM

## 2018-05-22 DIAGNOSIS — T451X5A Adverse effect of antineoplastic and immunosuppressive drugs, initial encounter: Secondary | ICD-10-CM | POA: Diagnosis not present

## 2018-05-22 DIAGNOSIS — D61810 Antineoplastic chemotherapy induced pancytopenia: Secondary | ICD-10-CM

## 2018-05-22 DIAGNOSIS — E871 Hypo-osmolality and hyponatremia: Secondary | ICD-10-CM | POA: Diagnosis not present

## 2018-05-22 DIAGNOSIS — Z5111 Encounter for antineoplastic chemotherapy: Secondary | ICD-10-CM | POA: Diagnosis not present

## 2018-05-22 LAB — CBC WITH DIFFERENTIAL/PLATELET
Basophils Absolute: 0.1 10*3/uL (ref 0–0.1)
Basophils Relative: 2 %
EOS ABS: 0 10*3/uL (ref 0–0.7)
EOS PCT: 0 %
HCT: 26 % — ABNORMAL LOW (ref 35.0–47.0)
Hemoglobin: 8.7 g/dL — ABNORMAL LOW (ref 12.0–16.0)
LYMPHS ABS: 1.3 10*3/uL (ref 1.0–3.6)
Lymphocytes Relative: 41 %
MCH: 29.1 pg (ref 26.0–34.0)
MCHC: 33.3 g/dL (ref 32.0–36.0)
MCV: 87.4 fL (ref 80.0–100.0)
MONO ABS: 1.5 10*3/uL — AB (ref 0.2–0.9)
Monocytes Relative: 49 %
NEUTROS PCT: 8 %
Neutro Abs: 0.3 10*3/uL — ABNORMAL LOW (ref 1.7–7.7)
PLATELETS: 33 10*3/uL — AB (ref 150–440)
RBC: 2.97 MIL/uL — AB (ref 3.80–5.20)
RDW: 14.9 % — AB (ref 11.5–14.5)
WBC: 3.2 10*3/uL — AB (ref 3.6–11.0)

## 2018-05-22 LAB — COMPREHENSIVE METABOLIC PANEL
ALK PHOS: 56 U/L (ref 38–126)
ALT: 13 U/L (ref 0–44)
AST: 15 U/L (ref 15–41)
Albumin: 3.6 g/dL (ref 3.5–5.0)
Anion gap: 9 (ref 5–15)
BILIRUBIN TOTAL: 1.1 mg/dL (ref 0.3–1.2)
BUN: 16 mg/dL (ref 8–23)
CALCIUM: 9.3 mg/dL (ref 8.9–10.3)
CO2: 22 mmol/L (ref 22–32)
Chloride: 97 mmol/L — ABNORMAL LOW (ref 98–111)
Creatinine, Ser: 0.66 mg/dL (ref 0.44–1.00)
Glucose, Bld: 111 mg/dL — ABNORMAL HIGH (ref 70–99)
Potassium: 3.7 mmol/L (ref 3.5–5.1)
Sodium: 128 mmol/L — ABNORMAL LOW (ref 135–145)
Total Protein: 6.3 g/dL — ABNORMAL LOW (ref 6.5–8.1)

## 2018-05-22 LAB — SAMPLE TO BLOOD BANK

## 2018-05-22 MED ORDER — AZACITIDINE CHEMO SQ INJECTION
75.0000 mg/m2 | Freq: Once | INTRAMUSCULAR | Status: AC
  Administered 2018-05-22: 122.5 mg via SUBCUTANEOUS
  Filled 2018-05-22: qty 4.9

## 2018-05-22 MED ORDER — ONDANSETRON HCL 4 MG PO TABS
8.0000 mg | ORAL_TABLET | Freq: Once | ORAL | Status: AC
  Administered 2018-05-22: 8 mg via ORAL
  Filled 2018-05-22: qty 2

## 2018-05-22 NOTE — Progress Notes (Signed)
Nonew changes noted today. The patient does c/o weakness

## 2018-05-23 ENCOUNTER — Inpatient Hospital Stay: Payer: Medicare Other

## 2018-05-23 ENCOUNTER — Ambulatory Visit: Payer: Medicare Other

## 2018-05-23 VITALS — BP 116/72 | HR 80 | Temp 97.5°F | Resp 18

## 2018-05-23 DIAGNOSIS — C9200 Acute myeloblastic leukemia, not having achieved remission: Secondary | ICD-10-CM

## 2018-05-23 DIAGNOSIS — Z5111 Encounter for antineoplastic chemotherapy: Secondary | ICD-10-CM | POA: Diagnosis not present

## 2018-05-23 MED ORDER — AZACITIDINE CHEMO SQ INJECTION
75.0000 mg/m2 | Freq: Once | INTRAMUSCULAR | Status: AC
  Administered 2018-05-23: 122.5 mg via SUBCUTANEOUS
  Filled 2018-05-23: qty 4.9

## 2018-05-23 MED ORDER — SODIUM CHLORIDE 0.9 % IV SOLN
Freq: Once | INTRAVENOUS | Status: AC
  Administered 2018-05-23: 11:00:00 via INTRAVENOUS
  Filled 2018-05-23: qty 1000

## 2018-05-23 MED ORDER — ONDANSETRON HCL 4 MG PO TABS
8.0000 mg | ORAL_TABLET | Freq: Once | ORAL | Status: AC
  Administered 2018-05-23: 8 mg via ORAL
  Filled 2018-05-23: qty 2

## 2018-05-23 NOTE — Progress Notes (Signed)
Hematology/Oncology Consult note [facility]  [phone] [phone]  Patient Care Team: Jerrol Banana., MD as PCP - General (Family Medicine) Bary Castilla, Forest Gleason, MD as Consulting Physician (General Surgery) Rayetta Humphrey as Consulting Physician (Optometry)   Name of the patient: Crystal Carpenter  [id number]  [birth year]   Date of visit: 05/23/18  Diagnosis- AML   Chief complaint/ Reason for visit-on treatment assessment prior to cycle 2-day 2 of Vidaza  Heme/Onc history: patient is a 85 year old female with a past medical history significant for breast cancer, hypercholesterolemia and hypertension among other medical problems. She has been referred to [place] for anemia. Recent CBC from 09/20/2017 showed white count of 5.6, H&H of 9.6/29.8 and a platelet count of 105. TSH was normal at 1.11. CMP was within normal limits. Serum iron was normal at 103. B12 levels were elevated at 1905 and folate was normal at greater than 24. Stool FOBT was negative.  Patient lives with her 81 year old husband and is independent of her ADLs and IADLs. She is still driving. She does feel fatigued on and off but today she reports feeling well. She denies any blood in her stool or urine.  Results of blood work from 10/21/2017 were as follows: CBC showed white count of 10.6, H&H of 10.1/31.9 with a platelet count of 210. Iron studies, haptoglobin and reticulocyte count was normal. Multiple myeloma panel showed no monoclonal protein and IFE was normal  Patients hb dropped down to 6.4 in may 2019. She received 2 units of prbc transfusion. She also had repeat anemia work up which was unremarkable. She then had progressive leucocytosis with wbc in 20's and left shift. She underwent bone marrow biopsy on 04/03/18. Noted to have 19% myeloid blasts in the marrow and 20% blasts in peripheral blood consistent with acute myeloid leukemia  NGS panel showed NF1  but no other mutations. She was seen by Dr. Janene Madeira at [facility]. She is curently on venetoclax. She is receiving [place] vidaza 7 days a month 1st dose was on 04/23/18  Bone marrow biopsy on day 20 post vidaza still showed 19% blasts.  Plan is to continue when it took lacks 5 days and repeat bone marrow biopsy end of August 2019 at [place] feels fatigued.  She did have a mild sore along the edge of her tongue which is improving with Magic mouthwash.  Denies any fever.  Macular erythematous skin rash over her lower extremities is improving.  She lives with her husband and is getting around the house without any significant problems.  No falls  ECOG PS- 1 Pain scale- 0 Opioid associated constipation- no  Review of systems- Review of Systems  Constitutional: Positive for malaise/fatigue. Negative for chills, fever and weight loss.  HENT: Negative for congestion, ear discharge and nosebleeds.   Eyes: Negative for blurred vision.  Respiratory: Negative for cough, hemoptysis, sputum production, shortness of breath and wheezing.   Cardiovascular: Negative for chest pain, palpitations, orthopnea and claudication.  Gastrointestinal: Negative for abdominal pain, blood in stool, constipation, diarrhea, heartburn, melena, nausea and vomiting.  Genitourinary: Negative for dysuria, flank pain, frequency, hematuria and urgency.  Musculoskeletal: Negative for back pain, joint pain and myalgias.  Skin: Negative for rash.  Neurological: Negative for dizziness, tingling, focal weakness, seizures, weakness and headaches.  Endo/Heme/Allergies: Does not bruise/bleed easily.  Psychiatric/Behavioral: Negative for depression and suicidal ideas. The patient does not have insomnia.  Allergies  Allergen Reactions  . Tape Rash  . Gabapentin     Headache, "felt weird in the head"  . Sulfa Antibiotics   . Allopurinol Rash  . Shellfish Allergy Rash     Past Medical History:  Diagnosis Date  .  Arthritis   . Cancer [facility]) July 2014   T2,N2a, Modified radical mastectomy. 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 COPD (chronic obstructive pulmonary disease) ([place])   . Degenerative disc disease, lumbar   . Hypertension   . Malignant neoplasm of breast (female), unspecified site July 2014   Her case was presented at the [facility] tumor board in July 2014. Recommendations were for chest wall/peripheral lymphatic radiation if metastatic disease was not identified, anti-estrogen therapy only if additional metastatic disease was detected on a PET CT. No plans for adjuvant chemotherapy if metastatic disease was identified was recommended.  . Osteoporosis   . Sciatic pain 2015  . Spinal stenosis      Past Surgical History:  Procedure Laterality Date  . ABDOMINAL HYSTERECTOMY    . BREAST SURGERY Left 04-28-13   left mastectomy, SN biopsy  . EPIDURAL BLOCK INJECTION  2015   x3    Social History   Socioeconomic History  . Marital status: Married    Spouse name: Not on file  . Number of children: Not on file  . Years of education: Not on file  . Highest education level: Not on file  Occupational History  . Not on file  Social Needs  . Financial resource strain: Not on file  . Food insecurity:    Worry: Not on file    Inability: Not on file  . Transportation needs:    Medical: Not on file    Non-medical: Not on file  Tobacco Use  . Smoking status: Never Smoker  . Smokeless tobacco: Never Used  Substance and Sexual Activity  . Alcohol use: No  . Drug use: No  . Sexual activity: Not on file  Lifestyle  . Physical activity:    Days per week: Not on file    Minutes per session: Not on file  . Stress: Not on file  Relationships  . Social connections:    Talks on phone: Not on file    Gets together: Not on file    Attends religious service: Not on file    Active member of club or organization: Not on file    Attends meetings of clubs or organizations: Not on  file    Relationship status: Not on file  . Intimate partner violence:    Fear of current or ex partner: Not on file    Emotionally abused: Not on file    Physically abused: Not on file    Forced sexual activity: Not on file  Other Topics Concern  . Not on file  Social History Narrative  . Not on file    Family History  Problem Relation Age of Onset  . Stroke Mother   . Heart disease Father   . Stroke Father   . Parkinson's disease Sister   . Parkinson's disease Brother      Current Outpatient Medications:  .  alendronate (FOSAMAX) 70 MG tablet, TAKE 1 TABLET BY MOUTH EVERY 7 DAYS. TAKE WITH A FULL GLASS OF WATER ON AN EMPTY STOMACH., Disp: 12 tablet, Rfl: 3 .  b complex vitamins tablet, Take 1 tablet by mouth daily., Disp: , Rfl:  .  ciprofloxacin (CIPRO) 500 MG tablet,  Take 1 tablet (500 mg total) by mouth 2 (two) times daily., Disp: 14 tablet, Rfl: 0 .  docusate sodium (COLACE) 100 MG capsule, Take 100 mg by mouth daily., Disp: , Rfl:  .  famotidine (PEPCID) 10 MG tablet, Take 10 mg by mouth daily as needed for heartburn or indigestion., Disp: , Rfl:  .  hydrochlorothiazide (HYDRODIURIL) 25 MG tablet, TAKE 1 TABLET BY MOUTH EVERY DAY, Disp: 90 tablet, Rfl: 3 .  magic mouthwash SOLN, Take 5 mLs by mouth 4 (four) times daily as needed for mouth pain., Disp: 240 mL, Rfl: 0 .  potassium chloride (MICRO-K) 10 MEQ CR capsule, TAKE 1 CAPSULE BY MOUTH EVERY DAY., Disp: 90 capsule, Rfl: 3 .  sertraline (ZOLOFT) 100 MG tablet, TAKE 1 TABLET BY MOUTH EVERY DAY, Disp: 30 tablet, Rfl: 11 .  azelastine (ASTELIN) 0.1 % nasal spray, Place into both nostrils 2 (two) times daily. Use in each nostril as directed, Disp: , Rfl:  .  fluticasone (FLONASE) 50 MCG/ACT nasal spray, Place 1 spray into both nostrils daily as needed. , Disp: , Rfl:  .  LORazepam (ATIVAN) 0.5 MG tablet, Take 1 tablet (0.5 mg total) by mouth every 6 (six) hours as needed (Nausea or vomiting). (Patient not taking: Reported on  05/22/2018), Disp: 30 tablet, Rfl: 0 .  naproxen sodium (ALEVE) 220 MG tablet, Take by mouth., Disp: , Rfl:  .  ondansetron (ZOFRAN) 8 MG tablet, Take 1 tablet (8 mg total) by mouth 2 (two) times daily as needed (Nausea or vomiting). (Patient not taking: Reported on 05/22/2018), Disp: 30 tablet, Rfl: 1 .  prochlorperazine (COMPAZINE) 10 MG tablet, Take 1 tablet (10 mg total) by mouth every 6 (six) hours as needed (Nausea or vomiting). (Patient not taking: Reported on 05/22/2018), Disp: 30 tablet, Rfl: 1 .  Saline 0.2 % SOLN, , Disp: , Rfl:   Physical exam:  Vitals:   05/22/18 1035  BP: 114/69  Pulse: 79  Resp: 18  Temp: (!) 97.3 F (36.3 C)  TempSrc: Tympanic  Weight: 132 lb 12.8 oz (60.2 kg)  Height: 5' 1.02" (1.55 m)   Physical Exam  Constitutional: She is oriented to person, place, and time.  Thin elderly frail lady in no acute distress  HENT:  Head: Normocephalic and atraumatic.  Mouth/Throat: Oropharynx is clear and moist.  There is a superficial ulcer noted over the left side of the tongue which is healing.  No other ulcerations noted  Eyes: Pupils are equal, round, and reactive to light. EOM are normal.  Neck: Normal range of motion.  Cardiovascular: Normal rate, regular rhythm and normal heart sounds.  Pulmonary/Chest: Effort normal and breath sounds normal.  Abdominal: Soft. Bowel sounds are normal.  Neurological: She is alert and oriented to person, place, and time.  Skin: Skin is warm and dry.  Scattered areas of erythematous macular rash over bilateral lower extremities     CMP Latest Ref Rng & Units 05/22/2018  Glucose 70 - 99 mg/dL 111(H)  BUN 8 - 23 mg/dL 16  Creatinine 0.44 - 1.00 mg/dL 0.66  Sodium 135 - 145 mmol/L 128(L)  Potassium 3.5 - 5.1 mmol/L 3.7  Chloride 98 - 111 mmol/L 97(L)  CO2 22 - 32 mmol/L 22  Calcium 8.9 - 10.3 mg/dL 9.3  Total Protein 6.5 - 8.1 g/dL 6.3(L)  Total Bilirubin 0.3 - 1.2 mg/dL 1.1  Alkaline Phos 38 - 126 U/L 56  AST 15 - 41 U/L 15    ALT 0 -  44 U/L 13   CBC Latest Ref Rng & Units 05/22/2018  WBC 3.6 - 11.0 K/uL 3.2(L)  Hemoglobin 12.0 - 16.0 g/dL 8.7(L)  Hematocrit 35.0 - 47.0 % 26.0(L)  Platelets 150 - 440 K/uL 33(L)      Assessment and plan- Patient is a 84 y.o. female with AML currently on venetoclax and Vidaza  She will proceed with cycle 2-day 2 of Vidaza today.  Plan is to give it for 7 days excluding Saturday and Sunday.  She is also currently on when it took lacks at 400 mg daily.  Counts today show pancytopenia but she does not require any blood transfusion at this time.  She is currently on ciprofloxacin twice daily for antibacterial prophylaxis given her ongoing neutropenia.  Her ANC 0.3 today.  I will hold off on adding fluconazole at this time.  Patient will continue to get CBC checked twice a week for possible blood or platelet transfusion  Hyponatremia: She will get 1 L of IV fluids today but I will plan to check serum and urine osmolarity as well as urine sodium along with her next set of labs to rule out any SIADH  She will be getting repeat bone marrow biopsy on 06/11/2018 followed by a visit with Dr. Janene Madeira on 06/12/2018.  I will see her back on 06/06/2018 with CBC and CMP   Visit Diagnosis 1. Acute myeloid leukemia not having achieved remission ([place])   2. Antineoplastic chemotherapy induced pancytopenia ([place])   3. Hyponatremia      Dr. Randa Evens, MD, MPH [facility] at [facility] [phone] 05/23/2018 9:33 AM

## 2018-05-26 ENCOUNTER — Other Ambulatory Visit: Payer: Medicare Other

## 2018-05-26 ENCOUNTER — Ambulatory Visit: Payer: Medicare Other

## 2018-05-26 ENCOUNTER — Inpatient Hospital Stay: Payer: Medicare Other

## 2018-05-26 ENCOUNTER — Ambulatory Visit: Payer: Medicare Other | Admitting: Oncology

## 2018-05-26 VITALS — BP 130/76 | HR 82 | Temp 99.9°F | Resp 18

## 2018-05-26 DIAGNOSIS — Z5111 Encounter for antineoplastic chemotherapy: Secondary | ICD-10-CM | POA: Diagnosis not present

## 2018-05-26 DIAGNOSIS — C9200 Acute myeloblastic leukemia, not having achieved remission: Secondary | ICD-10-CM

## 2018-05-26 MED ORDER — AZACITIDINE CHEMO SQ INJECTION
75.0000 mg/m2 | Freq: Once | INTRAMUSCULAR | Status: AC
  Administered 2018-05-26: 122.5 mg via SUBCUTANEOUS
  Filled 2018-05-26: qty 4.9

## 2018-05-26 MED ORDER — ONDANSETRON HCL 4 MG PO TABS
8.0000 mg | ORAL_TABLET | Freq: Once | ORAL | Status: AC
  Administered 2018-05-26: 8 mg via ORAL
  Filled 2018-05-26: qty 2

## 2018-05-27 ENCOUNTER — Ambulatory Visit: Payer: Medicare Other | Admitting: Oncology

## 2018-05-27 ENCOUNTER — Inpatient Hospital Stay: Payer: Medicare Other

## 2018-05-27 ENCOUNTER — Ambulatory Visit: Payer: Medicare Other

## 2018-05-27 VITALS — BP 131/72 | HR 76 | Temp 96.7°F | Resp 18

## 2018-05-27 DIAGNOSIS — C9200 Acute myeloblastic leukemia, not having achieved remission: Secondary | ICD-10-CM | POA: Diagnosis not present

## 2018-05-27 DIAGNOSIS — Z5111 Encounter for antineoplastic chemotherapy: Secondary | ICD-10-CM | POA: Diagnosis not present

## 2018-05-27 LAB — COMPREHENSIVE METABOLIC PANEL
ALBUMIN: 3.5 g/dL (ref 3.5–5.0)
ALK PHOS: 62 U/L (ref 38–126)
ALT: 13 U/L (ref 0–44)
AST: 15 U/L (ref 15–41)
Anion gap: 9 (ref 5–15)
BUN: 17 mg/dL (ref 8–23)
CALCIUM: 9.6 mg/dL (ref 8.9–10.3)
CO2: 24 mmol/L (ref 22–32)
CREATININE: 0.58 mg/dL (ref 0.44–1.00)
Chloride: 102 mmol/L (ref 98–111)
GFR calc non Af Amer: >60 mL/min (ref >60)
GLUCOSE: 119 mg/dL — AB (ref 70–99)
Potassium: 3.9 mmol/L (ref 3.5–5.1)
Sodium: 135 mmol/L (ref 135–145)
Total Bilirubin: 0.6 mg/dL (ref 0.3–1.2)
Total Protein: 6.2 g/dL — ABNORMAL LOW (ref 6.5–8.1)

## 2018-05-27 LAB — CBC WITH DIFFERENTIAL/PLATELET
Basophils Absolute: 0 10*3/uL (ref 0–0.1)
Basophils Relative: 1 %
EOS PCT: 0 %
Eosinophils Absolute: 0 10*3/uL (ref 0–0.7)
HCT: 25.8 % — ABNORMAL LOW (ref 35.0–47.0)
Hemoglobin: 8.8 g/dL — ABNORMAL LOW (ref 12.0–16.0)
LYMPHS ABS: 1.5 10*3/uL (ref 1.0–3.6)
Lymphocytes Relative: 38 %
MCH: 29.2 pg (ref 26.0–34.0)
MCHC: 34 g/dL (ref 32.0–36.0)
MCV: 86 fL (ref 80.0–100.0)
MONO ABS: 1.7 10*3/uL — AB (ref 0.2–0.9)
Monocytes Relative: 41 %
Neutro Abs: 0.8 10*3/uL — ABNORMAL LOW (ref 1.4–6.5)
Neutrophils Relative %: 20 %
PLATELETS: 39 10*3/uL — AB (ref 150–440)
RBC: 3 MIL/uL — ABNORMAL LOW (ref 3.80–5.20)
RDW: 15.4 % — AB (ref 11.5–14.5)
WBC: 4 10*3/uL (ref 3.6–11.0)

## 2018-05-27 LAB — SAMPLE TO BLOOD BANK

## 2018-05-27 LAB — SODIUM, URINE, RANDOM: Sodium, Ur: 64 mmol/L

## 2018-05-27 LAB — OSMOLALITY: Osmolality: 289 mOsm/kg (ref 275–295)

## 2018-05-27 LAB — OSMOLALITY, URINE: OSMOLALITY UR: 488 mosm/kg (ref 300–900)

## 2018-05-27 MED ORDER — ONDANSETRON HCL 4 MG PO TABS
8.0000 mg | ORAL_TABLET | Freq: Once | ORAL | Status: AC
  Administered 2018-05-27: 8 mg via ORAL
  Filled 2018-05-27: qty 2

## 2018-05-27 MED ORDER — AZACITIDINE CHEMO SQ INJECTION
75.0000 mg/m2 | Freq: Once | INTRAMUSCULAR | Status: AC
  Administered 2018-05-27: 122.5 mg via SUBCUTANEOUS
  Filled 2018-05-27: qty 4.9

## 2018-05-27 NOTE — Progress Notes (Signed)
05/27/18 lab results reviewed with MD, Dr. Janese Banks. Per MD order: no packed red blood cells transfusion today. Proceed with Vidaza treatment as scheduled.

## 2018-05-28 ENCOUNTER — Inpatient Hospital Stay: Payer: Medicare Other

## 2018-05-28 DIAGNOSIS — C9200 Acute myeloblastic leukemia, not having achieved remission: Secondary | ICD-10-CM | POA: Diagnosis not present

## 2018-05-28 DIAGNOSIS — Z5111 Encounter for antineoplastic chemotherapy: Secondary | ICD-10-CM | POA: Diagnosis not present

## 2018-05-28 MED ORDER — ONDANSETRON HCL 4 MG PO TABS
8.0000 mg | ORAL_TABLET | Freq: Once | ORAL | Status: AC
  Administered 2018-05-28: 8 mg via ORAL
  Filled 2018-05-28: qty 2

## 2018-05-28 MED ORDER — AZACITIDINE CHEMO SQ INJECTION
75.0000 mg/m2 | Freq: Once | INTRAMUSCULAR | Status: AC
  Administered 2018-05-28: 122.5 mg via SUBCUTANEOUS
  Filled 2018-05-28: qty 4.9

## 2018-05-29 ENCOUNTER — Inpatient Hospital Stay: Payer: Medicare Other

## 2018-05-29 VITALS — BP 112/70 | HR 81 | Temp 99.0°F | Resp 18

## 2018-05-29 DIAGNOSIS — C9200 Acute myeloblastic leukemia, not having achieved remission: Secondary | ICD-10-CM | POA: Diagnosis not present

## 2018-05-29 DIAGNOSIS — Z5111 Encounter for antineoplastic chemotherapy: Secondary | ICD-10-CM | POA: Diagnosis not present

## 2018-05-29 MED ORDER — ONDANSETRON HCL 4 MG PO TABS
8.0000 mg | ORAL_TABLET | Freq: Once | ORAL | Status: AC
  Administered 2018-05-29: 8 mg via ORAL
  Filled 2018-05-29: qty 2

## 2018-05-29 MED ORDER — AZACITIDINE CHEMO SQ INJECTION
75.0000 mg/m2 | Freq: Once | INTRAMUSCULAR | Status: AC
  Administered 2018-05-29: 122.5 mg via SUBCUTANEOUS
  Filled 2018-05-29: qty 4.9

## 2018-05-30 ENCOUNTER — Inpatient Hospital Stay: Payer: Medicare Other

## 2018-05-30 DIAGNOSIS — C9200 Acute myeloblastic leukemia, not having achieved remission: Secondary | ICD-10-CM | POA: Diagnosis not present

## 2018-05-30 DIAGNOSIS — Z5111 Encounter for antineoplastic chemotherapy: Secondary | ICD-10-CM | POA: Diagnosis not present

## 2018-05-30 LAB — CBC WITH DIFFERENTIAL/PLATELET
BASOS ABS: 0 10*3/uL (ref 0–0.1)
Basophils Relative: 1 %
EOS ABS: 0 10*3/uL (ref 0–0.7)
Eosinophils Relative: 0 %
HEMATOCRIT: 23.8 % — AB (ref 35.0–47.0)
HEMOGLOBIN: 8 g/dL — AB (ref 12.0–16.0)
LYMPHS PCT: 37 %
Lymphs Abs: 1.3 10*3/uL (ref 1.0–3.6)
MCH: 28.7 pg (ref 26.0–34.0)
MCHC: 33.7 g/dL (ref 32.0–36.0)
MCV: 85.1 fL (ref 80.0–100.0)
MONOS PCT: 39 %
Monocytes Absolute: 1.5 10*3/uL — ABNORMAL HIGH (ref 0.2–0.9)
NEUTROS ABS: 0.8 10*3/uL — AB (ref 1.4–6.5)
NEUTROS PCT: 23 %
Platelets: 23 10*3/uL — CL (ref 150–400)
RBC: 2.8 MIL/uL — ABNORMAL LOW (ref 3.80–5.20)
RDW: 15.2 % — AB (ref 11.5–14.5)
WBC: 3.6 10*3/uL (ref 3.6–11.0)

## 2018-05-30 LAB — SAMPLE TO BLOOD BANK

## 2018-05-30 NOTE — Progress Notes (Signed)
Per Judeen Hammans RN per Dr. Janese Banks pt does not need blood or platelet transfusion based on 05/30/18 labs and pt states "I feel fine" pt denies any increased fatigue, bleeding, dizziness or SOB. Pt stable at discharge.

## 2018-06-03 ENCOUNTER — Other Ambulatory Visit: Payer: Self-pay

## 2018-06-03 ENCOUNTER — Ambulatory Visit: Payer: Self-pay | Admitting: Family Medicine

## 2018-06-03 ENCOUNTER — Inpatient Hospital Stay: Payer: Medicare Other

## 2018-06-03 ENCOUNTER — Other Ambulatory Visit: Payer: Self-pay | Admitting: Oncology

## 2018-06-03 DIAGNOSIS — C9200 Acute myeloblastic leukemia, not having achieved remission: Secondary | ICD-10-CM

## 2018-06-03 DIAGNOSIS — Z5111 Encounter for antineoplastic chemotherapy: Secondary | ICD-10-CM | POA: Diagnosis not present

## 2018-06-03 DIAGNOSIS — D696 Thrombocytopenia, unspecified: Secondary | ICD-10-CM

## 2018-06-03 LAB — CBC WITH DIFFERENTIAL/PLATELET
BASOS ABS: 0.1 10*3/uL (ref 0–0.1)
Basophils Relative: 3 %
Eosinophils Absolute: 0 10*3/uL (ref 0–0.7)
Eosinophils Relative: 0 %
HEMATOCRIT: 22.1 % — AB (ref 35.0–47.0)
Hemoglobin: 7.6 g/dL — ABNORMAL LOW (ref 12.0–16.0)
LYMPHS ABS: 1.1 10*3/uL (ref 1.0–3.6)
LYMPHS PCT: 47 %
MCH: 29 pg (ref 26.0–34.0)
MCHC: 34.3 g/dL (ref 32.0–36.0)
MCV: 84.7 fL (ref 80.0–100.0)
MONO ABS: 0.5 10*3/uL (ref 0.2–0.9)
Monocytes Relative: 22 %
NEUTROS ABS: 0.6 10*3/uL — AB (ref 1.4–6.5)
Neutrophils Relative %: 28 %
Platelets: 8 10*3/uL — CL (ref 150–400)
RBC: 2.61 MIL/uL — AB (ref 3.80–5.20)
RDW: 15 % — ABNORMAL HIGH (ref 11.5–14.5)
WBC: 2.2 10*3/uL — ABNORMAL LOW (ref 3.6–11.0)

## 2018-06-03 LAB — COMPREHENSIVE METABOLIC PANEL
ALBUMIN: 3.6 g/dL (ref 3.5–5.0)
ALT: 13 U/L (ref 0–44)
ANION GAP: 8 (ref 5–15)
AST: 15 U/L (ref 15–41)
Alkaline Phosphatase: 71 U/L (ref 38–126)
BILIRUBIN TOTAL: 1.1 mg/dL (ref 0.3–1.2)
BUN: 18 mg/dL (ref 8–23)
CHLORIDE: 104 mmol/L (ref 98–111)
CO2: 25 mmol/L (ref 22–32)
Calcium: 9.6 mg/dL (ref 8.9–10.3)
Creatinine, Ser: 0.6 mg/dL (ref 0.44–1.00)
GFR calc Af Amer: >60 mL/min (ref >60)
GFR calc non Af Amer: >60 mL/min (ref >60)
GLUCOSE: 122 mg/dL — AB (ref 70–99)
POTASSIUM: 4.7 mmol/L (ref 3.5–5.1)
SODIUM: 137 mmol/L (ref 135–145)
TOTAL PROTEIN: 6.3 g/dL — AB (ref 6.5–8.1)

## 2018-06-03 LAB — SAMPLE TO BLOOD BANK

## 2018-06-03 MED ORDER — ACETAMINOPHEN 325 MG PO TABS
650.0000 mg | ORAL_TABLET | Freq: Once | ORAL | Status: AC
  Administered 2018-06-03: 650 mg via ORAL
  Filled 2018-06-03: qty 2

## 2018-06-03 MED ORDER — SODIUM CHLORIDE 0.9% IV SOLUTION
250.0000 mL | Freq: Once | INTRAVENOUS | Status: AC
  Administered 2018-06-03: 250 mL via INTRAVENOUS
  Filled 2018-06-03: qty 250

## 2018-06-03 NOTE — Progress Notes (Signed)
Per Dr. Janese Banks patient will only get 1 unit of platelets today. Physician is aware of platelets of 8 and Hgb of 7.6. Mrs. Crystal Carpenter will return on Friday for labs/MD and will receive blood transfusion if needed. Made patient aware to leave blood bank band on for Friday.  Patient told Judeen Hammans, RN about numbness and tingling in fingers and nausea since Friday.

## 2018-06-04 ENCOUNTER — Telehealth: Payer: Self-pay | Admitting: *Deleted

## 2018-06-04 NOTE — Telephone Encounter (Signed)
Pt is constipated and tried glycerin supp. And it broke the stool up she thinks but not any out. She feels nauseated and is going to take compazine. I told her to try miralax dose to see if she can have BM. I advised her with the wbc count being low we do not rec. Suppository because it can cause infection with some people. She will stick to miralax and see if it works. She has my direct number to call if she needs me

## 2018-06-05 ENCOUNTER — Other Ambulatory Visit: Payer: Self-pay | Admitting: Oncology

## 2018-06-05 ENCOUNTER — Other Ambulatory Visit: Payer: Self-pay | Admitting: *Deleted

## 2018-06-05 ENCOUNTER — Inpatient Hospital Stay: Payer: Medicare Other

## 2018-06-05 ENCOUNTER — Encounter: Payer: Self-pay | Admitting: Nurse Practitioner

## 2018-06-05 ENCOUNTER — Ambulatory Visit
Admission: RE | Admit: 2018-06-05 | Discharge: 2018-06-05 | Disposition: A | Discharge status: Home / Self Care | Payer: Medicare Other | Source: Ambulatory Visit | Attending: Oncology | Admitting: Oncology

## 2018-06-05 ENCOUNTER — Other Ambulatory Visit: Payer: Self-pay

## 2018-06-05 ENCOUNTER — Inpatient Hospital Stay (HOSPITAL_BASED_OUTPATIENT_CLINIC_OR_DEPARTMENT_OTHER): Payer: Medicare Other | Admitting: Nurse Practitioner

## 2018-06-05 VITALS — BP 95/55 | HR 89 | Temp 99.4°F

## 2018-06-05 DIAGNOSIS — R630 Anorexia: Secondary | ICD-10-CM

## 2018-06-05 DIAGNOSIS — K6289 Other specified diseases of anus and rectum: Secondary | ICD-10-CM

## 2018-06-05 DIAGNOSIS — C9200 Acute myeloblastic leukemia, not having achieved remission: Secondary | ICD-10-CM

## 2018-06-05 DIAGNOSIS — R110 Nausea: Secondary | ICD-10-CM

## 2018-06-05 DIAGNOSIS — R197 Diarrhea, unspecified: Secondary | ICD-10-CM | POA: Diagnosis not present

## 2018-06-05 DIAGNOSIS — D649 Anemia, unspecified: Secondary | ICD-10-CM

## 2018-06-05 DIAGNOSIS — Z5111 Encounter for antineoplastic chemotherapy: Secondary | ICD-10-CM | POA: Diagnosis not present

## 2018-06-05 DIAGNOSIS — E43 Unspecified severe protein-calorie malnutrition: Secondary | ICD-10-CM

## 2018-06-05 DIAGNOSIS — D696 Thrombocytopenia, unspecified: Secondary | ICD-10-CM | POA: Diagnosis not present

## 2018-06-05 DIAGNOSIS — R634 Abnormal weight loss: Secondary | ICD-10-CM

## 2018-06-05 DIAGNOSIS — R5383 Other fatigue: Secondary | ICD-10-CM

## 2018-06-05 LAB — PREPARE PLATELET PHERESIS: Unit division: 0

## 2018-06-05 LAB — SAMPLE TO BLOOD BANK

## 2018-06-05 LAB — COMPREHENSIVE METABOLIC PANEL
ALK PHOS: 64 U/L (ref 38–126)
ALT: 11 U/L (ref 0–44)
AST: 15 U/L (ref 15–41)
Albumin: 3.4 g/dL — ABNORMAL LOW (ref 3.5–5.0)
Anion gap: 9 (ref 5–15)
BUN: 18 mg/dL (ref 8–23)
CALCIUM: 9.5 mg/dL (ref 8.9–10.3)
CO2: 25 mmol/L (ref 22–32)
CREATININE: 0.78 mg/dL (ref 0.44–1.00)
Chloride: 102 mmol/L (ref 98–111)
Glucose, Bld: 125 mg/dL — ABNORMAL HIGH (ref 70–99)
Potassium: 3.7 mmol/L (ref 3.5–5.1)
Sodium: 136 mmol/L (ref 135–145)
Total Bilirubin: 1.4 mg/dL — ABNORMAL HIGH (ref 0.3–1.2)
Total Protein: 5.9 g/dL — ABNORMAL LOW (ref 6.5–8.1)

## 2018-06-05 LAB — CBC WITH DIFFERENTIAL/PLATELET
BASOS ABS: 0 10*3/uL (ref 0–0.1)
Band Neutrophils: 6 %
Basophils Relative: 0 %
Blasts: 0 %
EOS PCT: 0 %
Eosinophils Absolute: 0 10*3/uL (ref 0–0.7)
HEMATOCRIT: 19.8 % — AB (ref 35.0–47.0)
Hemoglobin: 6.7 g/dL — ABNORMAL LOW (ref 12.0–16.0)
LYMPHS ABS: 0.9 10*3/uL — AB (ref 1.0–3.6)
Lymphocytes Relative: 26 %
MCH: 28.5 pg (ref 26.0–34.0)
MCHC: 33.9 g/dL (ref 32.0–36.0)
MCV: 84.1 fL (ref 80.0–100.0)
METAMYELOCYTES PCT: 1 %
Monocytes Absolute: 0.5 10*3/uL (ref 0.2–0.9)
Monocytes Relative: 15 %
Myelocytes: 0 %
NRBC: 0 /100{WBCs}
Neutro Abs: 2.2 10*3/uL (ref 1.4–6.5)
Neutrophils Relative %: 52 %
Other: 0 %
Platelets: 23 10*3/uL — CL (ref 150–400)
Promyelocytes Relative: 0 %
RBC: 2.36 MIL/uL — AB (ref 3.80–5.20)
RDW: 15 % — AB (ref 11.5–14.5)
Smear Review: DECREASED
WBC: 3.6 10*3/uL (ref 3.6–11.0)

## 2018-06-05 LAB — BPAM PLATELET PHERESIS
Blood Product Expiration Date: 201908202359
ISSUE DATE / TIME: 201908201128
UNIT TYPE AND RH: 8400

## 2018-06-05 LAB — PREPARE RBC (CROSSMATCH)

## 2018-06-05 MED ORDER — ACETAMINOPHEN 325 MG PO TABS
650.0000 mg | ORAL_TABLET | Freq: Once | ORAL | Status: AC
  Administered 2018-06-05: 650 mg via ORAL

## 2018-06-05 MED ORDER — SODIUM CHLORIDE 0.9% FLUSH: 3.0000 mL | INTRAVENOUS | Status: DC | PRN

## 2018-06-05 MED ORDER — OLANZAPINE 5 MG PO TABS: 5.0000 mg | ORAL_TABLET | Freq: Every day | ORAL | 0 refills | Status: AC

## 2018-06-05 MED ORDER — ONDANSETRON HCL 4 MG/2ML IJ SOLN
4.0000 mg | Freq: Once | INTRAMUSCULAR | Status: AC
  Administered 2018-06-05: 4 mg via INTRAVENOUS
  Filled 2018-06-05: qty 2

## 2018-06-05 MED ORDER — HEPARIN SOD (PORK) LOCK FLUSH 100 UNIT/ML IV SOLN: 250.0000 [IU] | INTRAVENOUS | Status: DC | PRN

## 2018-06-05 MED ORDER — SODIUM CHLORIDE 0.9% FLUSH: 10.0000 mL | INTRAVENOUS | Status: DC | PRN

## 2018-06-05 MED ORDER — SODIUM CHLORIDE 0.9% IV SOLUTION: 250.0000 mL | Freq: Once | INTRAVENOUS | Status: DC

## 2018-06-05 MED ORDER — HEPARIN SOD (PORK) LOCK FLUSH 100 UNIT/ML IV SOLN: 500.0000 [IU] | Freq: Every day | INTRAVENOUS | Status: DC | PRN

## 2018-06-05 NOTE — Progress Notes (Signed)
Symptom Management [place]  Telephone:([phone]) [phone] [phone]  Patient Care Team: Crystal Carpenter., MD as PCP - General (Family Medicine) Crystal Carpenter, Crystal Gleason, MD as Consulting Physician (General Surgery) Crystal Carpenter, [place] as Consulting Physician (Optometry) Crystal Guadeloupe, MD as Medical Oncologist (Medical Oncology)   Name of the patient: Crystal Carpenter  [id number]  05-09-1924   Date of visit: 06/05/18  Diagnosis- AML  Chief complaint/ Reason for visit- Fatigue & Poor Appetite  Heme/Onc history:  Oncology History   Patient presented as 84 year old female with past medical history significant for breast cancer, hypercholesterolemia, hypertension, and others.  She was referred to [facility] for anemia.  CBC from 09/20/2017 showed wbc 5.6, H&H 9.6/29.8, platelet count 105.  TSH was 1.11 (normal).  CMP was within normal limits.  Serum iron was normal at 103.  B12 levels were elevated at 1905 and folate was normal at greater than 24.  Stool FOBT was negative.  Patient lives with her 60 year old husband and is independent of her ADLs and IADLs.  She still drives.  She reported fatigue but overall feeling well at that time.  Denied blood in her stool or urine.  Results of blood work from 10/21/2017 were as follows: CBC showed white count of 10.6, H&H of 10.1/31.9 with a platelet count of 210. Iron studies, haptoglobin and reticulocyte count was normal. Multiple myeloma panel showed no monoclonal protein and IFE was normal  Patients hb dropped down to 6.4 in may 2019. She received 2 units of prbc transfusion. She also had repeat anemia work up which was unremarkable. She then had progressive leucocytosis with wbc in 20's and left shift. She underwent bone marrow biopsy on 04/03/18. Noted to have 19% myeloid blasts in the marrow and 20% blasts in peripheral blood consistent with acute myeloid leukemia  NGS panel showed NF1 but no other mutations. She was  seen by Dr. Janene Carpenter at [facility]. She was started on venetoclax. She is receiving Alvarado vidaza 7 days a month. Initiated on 04/23/18  Bone marrow biopsy on day 20 post vidaza still showed 19% blasts.       AML (acute myeloblastic leukemia) ([place])   04/09/2018 Initial Diagnosis    AML (acute myeloblastic leukemia) ([place])    04/09/2018 -  Chemotherapy    The patient had azaCITIDine (VIDAZA) chemo injection 122.5 mg, 75 mg/m2 = 122.5 mg, Subcutaneous,  Once, 1 of 4 cycles Administration: 122.5 mg (05/21/2018), 122.5 mg (05/22/2018), 122.5 mg (05/23/2018), 122.5 mg (05/26/2018), 122.5 mg (05/27/2018), 122.5 mg (05/28/2018), 122.5 mg (05/29/2018)  for chemotherapy treatment.     Current plan to continue Vidaza and Ventoclax and repeat bone marrow biopsy on 06/10/18.    Interval history- Crystal Carpenter, 84 year old female, who presents to symptom management clinic for fatigue and poor appetite.  Symptoms have been present for a few weeks but she is noticed that they have gradually worsened since most recent cycle of chemotherapy.  Family has noticed weight loss of approximately 10 pounds.  Associated symptoms: Fatigue and malaise.  Unsure of anything that makes symptoms better or worse.  Evaluation to date: None.  Treatments tried: Variety of foods, rest; no significant improvement.  She also complains of some constipation and was prescribed MiraLAX at [facility]. She tried a glycerin suppository at home with passage of some stool. She subsequently developed loose stools and wears pads for some stool leakage.  She complains of some irritation around her anus.  She was additionally  diagnosed with hemorrhoids at [facility] and prescribed Preparation H which she states has resolved her symptoms.  She states that she is overwhelmed with "pills".  She has not been taking Valtrex in the past 3 days.  ECOG FS:2 - Symptomatic, <50% confined to bed  Review of systems- Review of Systems  Constitutional: Positive for malaise/fatigue and weight  loss (10 lbs in 2 weeks). Negative for chills and fever.  HENT: Negative for congestion, ear discharge, ear pain, sinus pain, sore throat and tinnitus.   Eyes: Negative.   Respiratory: Negative.  Negative for cough, sputum production and shortness of breath.   Cardiovascular: Negative for chest pain, palpitations, orthopnea, claudication and leg swelling.  Gastrointestinal: Positive for constipation (Constipation (resolved)-now has some stool leakage) and diarrhea. Negative for abdominal pain, blood in stool, heartburn, nausea and vomiting.       Perianal irritation.  Hemorrhoids (resolved).  Genitourinary: Negative.   Musculoskeletal: Negative.   Skin: Negative.   Neurological: Positive for weakness. Negative for dizziness, tingling and headaches.  Endo/Heme/Allergies: Negative.   Psychiatric/Behavioral: Negative.      Current treatment- Vidaza & Venetoclax  Allergies  Allergen Reactions  . Tape Rash  . Gabapentin     Headache, "felt weird in the head"  . Sulfa Antibiotics   . Allopurinol Rash  . Shellfish Allergy Rash    Past Medical History:  Diagnosis Date  . Arthritis   . Cancer [facility]) July 2014   T2,N2a, Modified radical mastectomy. 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 COPD (chronic obstructive pulmonary disease) ([place])   . Degenerative disc disease, lumbar   . Hypertension   . Malignant neoplasm of breast (female), unspecified site July 2014   Her case was presented at the [facility] tumor board in July 2014. Recommendations were for chest wall/peripheral lymphatic radiation if metastatic disease was not identified, anti-estrogen therapy only if additional metastatic disease was detected on a PET CT. No plans for adjuvant chemotherapy if metastatic disease was identified was recommended.  . Osteoporosis   . Sciatic pain 2015  . Spinal stenosis     Past Surgical History:  Procedure Laterality Date  . ABDOMINAL HYSTERECTOMY    . BREAST SURGERY Left  04-28-13   left mastectomy, SN biopsy  . EPIDURAL BLOCK INJECTION  2015   x3    Social History   Socioeconomic History  . Marital status: Married    Spouse name: Not on file  . Number of children: Not on file  . Years of education: Not on file  . Highest education level: Not on file  Occupational History  . Not on file  Social Needs  . Financial resource strain: Not on file  . Food insecurity:    Worry: Not on file    Inability: Not on file  . Transportation needs:    Medical: Not on file    Non-medical: Not on file  Tobacco Use  . Smoking status: Never Smoker  . Smokeless tobacco: Never Used  Substance and Sexual Activity  . Alcohol use: No  . Drug use: No  . Sexual activity: Not on file  Lifestyle  . Physical activity:    Days per week: Not on file    Minutes per session: Not on file  . Stress: Not on file  Relationships  . Social connections:    Talks on phone: Not on file    Gets together: Not on file    Attends religious service: Not on file  Active member of club or organization: Not on file    Attends meetings of clubs or organizations: Not on file    Relationship status: Not on file  . Intimate partner violence:    Fear of current or ex partner: Not on file    Emotionally abused: Not on file    Physically abused: Not on file    Forced sexual activity: Not on file  Other Topics Concern  . Not on file  Social History Narrative  . Not on file    Family History  Problem Relation Age of Onset  . Stroke Mother   . Heart disease Father   . Stroke Father   . Parkinson's disease Sister   . Parkinson's disease Brother      Current Outpatient Medications:  .  alendronate (FOSAMAX) 70 MG tablet, TAKE 1 TABLET BY MOUTH EVERY 7 DAYS. TAKE WITH A FULL GLASS OF WATER ON AN EMPTY STOMACH., Disp: 12 tablet, Rfl: 3 .  azelastine (ASTELIN) 0.1 % nasal spray, Place into both nostrils 2 (two) times daily. Use in each nostril as directed, Disp: , Rfl:  .  b  complex vitamins tablet, Take 1 tablet by mouth daily., Disp: , Rfl:  .  ciprofloxacin (CIPRO) 500 MG tablet, Take 1 tablet (500 mg total) by mouth 2 (two) times daily., Disp: 14 tablet, Rfl: 0 .  docusate sodium (COLACE) 100 MG capsule, Take 100 mg by mouth daily., Disp: , Rfl:  .  famotidine (PEPCID) 10 MG tablet, Take 10 mg by mouth daily as needed for heartburn or indigestion., Disp: , Rfl:  .  fluticasone (FLONASE) 50 MCG/ACT nasal spray, Place 1 spray into both nostrils daily as needed. , Disp: , Rfl:  .  hydrochlorothiazide (HYDRODIURIL) 25 MG tablet, TAKE 1 TABLET BY MOUTH EVERY DAY, Disp: 90 tablet, Rfl: 3 .  LORazepam (ATIVAN) 0.5 MG tablet, Take 1 tablet (0.5 mg total) by mouth every 6 (six) hours as needed (Nausea or vomiting). (Patient not taking: Reported on 05/22/2018), Disp: 30 tablet, Rfl: 0 .  magic mouthwash SOLN, Take 5 mLs by mouth 4 (four) times daily as needed for mouth pain., Disp: 240 mL, Rfl: 0 .  naproxen sodium (ALEVE) 220 MG tablet, Take by mouth., Disp: , Rfl:  .  ondansetron (ZOFRAN) 8 MG tablet, Take 1 tablet (8 mg total) by mouth 2 (two) times daily as needed (Nausea or vomiting). (Patient not taking: Reported on 05/22/2018), Disp: 30 tablet, Rfl: 1 .  potassium chloride (MICRO-K) 10 MEQ CR capsule, TAKE 1 CAPSULE BY MOUTH EVERY DAY., Disp: 90 capsule, Rfl: 3 .  prochlorperazine (COMPAZINE) 10 MG tablet, Take 1 tablet (10 mg total) by mouth every 6 (six) hours as needed (Nausea or vomiting). (Patient not taking: Reported on 05/22/2018), Disp: 30 tablet, Rfl: 1 .  Saline 0.2 % SOLN, , Disp: , Rfl:  .  sertraline (ZOLOFT) 100 MG tablet, TAKE 1 TABLET BY MOUTH EVERY DAY, Disp: 30 tablet, Rfl: 11  Physical exam:  Vitals:   06/05/18 1336  BP: (!) 95/55  Pulse: 89  Temp: 99.4 F (37.4 C)  TempSrc: Tympanic  SpO2: 97%   GENERAL: Elderly, frail appearing.  No acute distress.  Accompanied. In wheelchair. HEENT:  Sclerae anicteric.  Oropharynx clear and moist. No  ulcerations or evidence of oropharyngeal candidiasis. Neck is supple.  LUNGS:  Clear to auscultation bilaterally.  No wheezes or rhonchi. HEART:  Regular rate and rhythm. No murmur appreciated. ABDOMEN:  Soft, nontender, nondistended.  Positive, normoactive bowel sounds. EXTREMITIES:  No peripheral edema.   SKIN:  Clear with no obvious rashes or skin changes. No nail dyscrasia. NEURO:  Nonfocal. Well oriented.  Appropriate affect.   CMP Latest Ref Rng & Units 06/05/2018  Glucose 70 - 99 mg/dL 125(H)  BUN 8 - 23 mg/dL 18  Creatinine 0.44 - 1.00 mg/dL 0.78  Sodium 135 - 145 mmol/L 136  Potassium 3.5 - 5.1 mmol/L 3.7  Chloride 98 - 111 mmol/L 102  CO2 22 - 32 mmol/L 25  Calcium 8.9 - 10.3 mg/dL 9.5  Total Protein 6.5 - 8.1 g/dL 5.9(L)  Total Bilirubin 0.3 - 1.2 mg/dL 1.4(H)  Alkaline Phos 38 - 126 U/L 64  AST 15 - 41 U/L 15  ALT 0 - 44 U/L 11   CBC Latest Ref Rng & Units 06/05/2018  WBC 3.6 - 11.0 K/uL 3.6  Hemoglobin 12.0 - 16.0 g/dL 6.7(L)  Hematocrit 35.0 - 47.0 % 19.8(L)  Platelets 150 - 400 K/uL 23(LL)    No images are attached to the encounter.  No results found.  Assessment and plan- Patient is a 84 y.o. female diagnosed with AML who presents to symptom management clinic for poor appetite with weight loss, diarrhea, and perianal irritation.   1. AML-currently on Vidaza and Venetoclax. Scheduled to start third cycle of Vidaza on 06/18/18  2. Anemia- hmg today 6.7.  Transfusion of CMVN leukord pRBCs today and one unit tomorrow.  We will continue to check counts weekly and evaluate for possible blood transfusions if hemoglobin < 7.   3. Weight Loss & Poor Appetite-approximately 10 pound weight loss over past few weeks with poor appetite.  Mild nausea. Zofran IV given in clinic along with fluids for gentle hydration. Start Zyprexa 5 mg nightly which may help with mild nausea and appetite stimulation.  Encouraged frequent small meals, nausea prophylaxis as needed, and consumption  of protein.   4. Perianal Irritation & Diarrhea-previous constipation now resolved with some diarrhea and perianal irritation.  Advised to wash perianal area gently pat dry.  She can use barrier cream such as Desitin for comfort and to reduce irritation.  Encouraged her to consume plenty of fluids eating a balanced diet, and being active can minimize risks of constipation and/or diarrhea.  5. Fatigue-suspect fatigue may be related to poor oral intake, weight loss, and side effect of treatments, including anemia.   6.  Thrombocytopenia- plt today 23. No abnormal bleeding or bruising.  Continue to check counts weekly for possible platelet transfusions 1-2 times weekly prn.   Case and findings discussed with Dr. Janese Banks who agrees with plan of care.  Patient to follow-up with Dr. Janese Banks on 06/06/2018 with labs.  Plan for repeat bone marrow biopsy on 06/11/2018   Visit Diagnosis 1. Acute myeloid leukemia not having achieved remission ([place])   2. Severe protein-calorie malnutrition ([place])     Patient expressed understanding and was in agreement with this plan. She also understands that She can call clinic at any time with any questions, concerns, or complaints.   Thank you for allowing me to participate in the care of this very pleasant patient.   Beckey Rutter, DNP, AGNP-C [place] at [facility] [phone] (work cell) [phone] (office)

## 2018-06-05 NOTE — Progress Notes (Unsigned)
Met c

## 2018-06-06 ENCOUNTER — Inpatient Hospital Stay: Payer: Medicare Other

## 2018-06-06 ENCOUNTER — Encounter: Payer: Self-pay | Admitting: Oncology

## 2018-06-06 ENCOUNTER — Other Ambulatory Visit: Payer: Self-pay | Admitting: *Deleted

## 2018-06-06 ENCOUNTER — Inpatient Hospital Stay (HOSPITAL_BASED_OUTPATIENT_CLINIC_OR_DEPARTMENT_OTHER): Payer: Medicare Other | Admitting: Oncology

## 2018-06-06 ENCOUNTER — Telehealth: Payer: Self-pay | Admitting: Family Medicine

## 2018-06-06 VITALS — BP 107/69 | HR 87 | Temp 97.5°F | Resp 18 | Ht 61.02 in | Wt 120.0 lb

## 2018-06-06 DIAGNOSIS — D61810 Antineoplastic chemotherapy induced pancytopenia: Secondary | ICD-10-CM | POA: Diagnosis not present

## 2018-06-06 DIAGNOSIS — C9200 Acute myeloblastic leukemia, not having achieved remission: Secondary | ICD-10-CM | POA: Diagnosis not present

## 2018-06-06 DIAGNOSIS — T451X5A Adverse effect of antineoplastic and immunosuppressive drugs, initial encounter: Secondary | ICD-10-CM | POA: Diagnosis not present

## 2018-06-06 DIAGNOSIS — Z5111 Encounter for antineoplastic chemotherapy: Secondary | ICD-10-CM | POA: Diagnosis not present

## 2018-06-06 DIAGNOSIS — D649 Anemia, unspecified: Secondary | ICD-10-CM

## 2018-06-06 DIAGNOSIS — Z79899 Other long term (current) drug therapy: Secondary | ICD-10-CM

## 2018-06-06 LAB — PREPARE RBC (CROSSMATCH)

## 2018-06-06 MED ORDER — ACETAMINOPHEN 325 MG PO TABS
650.0000 mg | ORAL_TABLET | Freq: Once | ORAL | Status: AC
  Administered 2018-06-06: 650 mg via ORAL

## 2018-06-06 MED ORDER — ACETAMINOPHEN 325 MG PO TABS
ORAL_TABLET | ORAL | Status: AC
  Filled 2018-06-06: qty 2

## 2018-06-06 MED ORDER — SODIUM CHLORIDE 0.9% IV SOLUTION
250.0000 mL | Freq: Once | INTRAVENOUS | Status: AC
  Administered 2018-06-06: 250 mL via INTRAVENOUS
  Filled 2018-06-06: qty 250

## 2018-06-06 NOTE — Telephone Encounter (Signed)
Please review. Thanks!  

## 2018-06-06 NOTE — Progress Notes (Signed)
Hematology/Oncology Consult note [facility]  [phone] [phone]  Patient Care Team: Jerrol Banana., MD as PCP - General (Family Medicine) Bary Castilla, Forest Gleason, MD as Consulting Physician (General Surgery) Rayetta Humphrey as Consulting Physician (Optometry)   Name of the patient: Crystal Carpenter  [id number]  01-14-1924   Date of visit: 06/06/18  Diagnosis-AML currently on vidaza and venetoclax  Chief complaint/ Reason for visit-routine follow-up of AML  Heme/Onc history: patient is a 84 year old female with a past medical history significant for breast cancer, hypercholesterolemia and hypertension among other medical problems. She has been referred to [place] for anemia. Recent CBC from 09/20/2017 showed white count of 5.6, H&H of 9.6/29.8 and a platelet count of 105. TSH was normal at 1.11. CMP was within normal limits. Serum iron was normal at 103. B12 levels were elevated at 1905 and folate was normal at greater than 24. Stool FOBT was negative.  Patient lives with her 33 year old husband and is independent of her ADLs and IADLs. She is still driving. She does feel fatigued on and off but today she reports feeling well. She denies any blood in her stool or urine.  Results of blood work from 10/21/2017 were as follows: CBC showed white count of 10.6, H&H of 10.1/31.9 with a platelet count of 210. Iron studies, haptoglobin and reticulocyte count was normal. Multiple myeloma panel showed no monoclonal protein and IFE was normal  Patients hb dropped down to 6.4 in may 2019. She received 2 units of prbc transfusion. She also had repeat anemia work up which was unremarkable. She then had progressive leucocytosis with wbc in 20's and left shift. She underwent bone marrow biopsy on 04/03/18. Noted to have 19% myeloid blasts in the marrow and 20% blasts in peripheral blood consistent with acute myeloid leukemia  NGS panel showed NF1  but no other mutations. She was seen by Dr. Janene Madeira at [facility]. She is curently on venetoclax. She is receiving [place] vidaza 7 days a month 1st dose was on 04/23/18  Bone marrow biopsy on day 20 post vidaza still showed 19% blasts.  Plan is to continue vidaza and venetoclax.  Repeat bone marrow biopsy to be done on 06/10/2018   Interval history-she is getting more fatigued from the chemotherapy.  Also her appetite has not been good although she is trying to eat.  She has lost about 10 pounds over the last 2 weeks.  She was complaining of constipation and was prescribed MiraLAX at [facility].  This led to loose stools and she continues to have some stool leakage for which she is wearing diapers and depends.  The leakage of stool has caused her perianal area to become raw and irritated for which she is using Desitin cream.  She also had some problems with hemorrhoids and was prescribed Preparation H at [facility].  She is finding hard to swallow all her pills and has not taken her Valtrex in the last few days  ECOG PS- 2 Pain scale- 0 Opioid associated constipation- no  Review of systems- Review of Systems  Constitutional: Positive for malaise/fatigue. Negative for chills, fever and weight loss.  HENT: Negative for congestion, ear discharge and nosebleeds.   Eyes: Negative for blurred vision.  Respiratory: Negative for cough, hemoptysis, sputum production, shortness of breath and wheezing.   Cardiovascular: Negative for chest pain, palpitations, orthopnea and claudication.  Gastrointestinal: Positive for diarrhea. Negative for abdominal pain, blood in stool, constipation, heartburn, melena, nausea and  vomiting.  Genitourinary: Negative for dysuria, flank pain, frequency, hematuria and urgency.  Musculoskeletal: Negative for back pain, joint pain and myalgias.  Skin: Negative for rash.  Neurological: Negative for dizziness, tingling, focal weakness, seizures, weakness and headaches.  Endo/Heme/Allergies: Does not  bruise/bleed easily.  Psychiatric/Behavioral: Negative for depression and suicidal ideas. The patient does not have insomnia.       Allergies  Allergen Reactions  . Tape Rash  . Gabapentin     Headache, "felt weird in the head"  . Sulfa Antibiotics   . Allopurinol Rash  . Shellfish Allergy Rash     Past Medical History:  Diagnosis Date  . Arthritis   . Cancer [facility]) July 2014   T2,N2a, Modified radical mastectomy. 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 COPD (chronic obstructive pulmonary disease) ([place])   . Degenerative disc disease, lumbar   . Hypertension   . Malignant neoplasm of breast (female), unspecified site July 2014   Her case was presented at the [facility] tumor board in July 2014. Recommendations were for chest wall/peripheral lymphatic radiation if metastatic disease was not identified, anti-estrogen therapy only if additional metastatic disease was detected on a PET CT. No plans for adjuvant chemotherapy if metastatic disease was identified was recommended.  . Osteoporosis   . Sciatic pain 2015  . Spinal stenosis      Past Surgical History:  Procedure Laterality Date  . ABDOMINAL HYSTERECTOMY    . BREAST SURGERY Left 04-28-13   left mastectomy, SN biopsy  . EPIDURAL BLOCK INJECTION  2015   x3    Social History   Socioeconomic History  . Marital status: Married    Spouse name: Not on file  . Number of children: Not on file  . Years of education: Not on file  . Highest education level: Not on file  Occupational History  . Not on file  Social Needs  . Financial resource strain: Not on file  . Food insecurity:    Worry: Not on file    Inability: Not on file  . Transportation needs:    Medical: Not on file    Non-medical: Not on file  Tobacco Use  . Smoking status: Never Smoker  . Smokeless tobacco: Never Used  Substance and Sexual Activity  . Alcohol use: No  . Drug use: No  . Sexual activity: Not on file  Lifestyle  .  Physical activity:    Days per week: Not on file    Minutes per session: Not on file  . Stress: Not on file  Relationships  . Social connections:    Talks on phone: Not on file    Gets together: Not on file    Attends religious service: Not on file    Active member of club or organization: Not on file    Attends meetings of clubs or organizations: Not on file    Relationship status: Not on file  . Intimate partner violence:    Fear of current or ex partner: Not on file    Emotionally abused: Not on file    Physically abused: Not on file    Forced sexual activity: Not on file  Other Topics Concern  . Not on file  Social History Narrative  . Not on file    Family History  Problem Relation Age of Onset  . Stroke Mother   . Heart disease Father   . Stroke Father   . Parkinson's disease Sister   . Parkinson's  disease Brother      Current Outpatient Medications:  .  alendronate (FOSAMAX) 70 MG tablet, TAKE 1 TABLET BY MOUTH EVERY 7 DAYS. TAKE WITH A FULL GLASS OF WATER ON AN EMPTY STOMACH., Disp: 12 tablet, Rfl: 3 .  b complex vitamins tablet, Take 1 tablet by mouth daily., Disp: , Rfl:  .  docusate sodium (COLACE) 100 MG capsule, Take 100 mg by mouth daily., Disp: , Rfl:  .  famotidine (PEPCID) 10 MG tablet, Take 10 mg by mouth daily as needed for heartburn or indigestion., Disp: , Rfl:  .  hydrochlorothiazide (HYDRODIURIL) 25 MG tablet, TAKE 1 TABLET BY MOUTH EVERY DAY, Disp: 90 tablet, Rfl: 3 .  OLANZapine (ZYPREXA) 5 MG tablet, Take 1 tablet (5 mg total) by mouth at bedtime., Disp: 30 tablet, Rfl: 0 .  potassium chloride (MICRO-K) 10 MEQ CR capsule, TAKE 1 CAPSULE BY MOUTH EVERY DAY., Disp: 90 capsule, Rfl: 3 .  sertraline (ZOLOFT) 100 MG tablet, TAKE 1 TABLET BY MOUTH EVERY DAY, Disp: 30 tablet, Rfl: 11 .  azelastine (ASTELIN) 0.1 % nasal spray, Place into both nostrils 2 (two) times daily. Use in each nostril as directed, Disp: , Rfl:  .  ciprofloxacin (CIPRO) 500 MG  tablet, Take 1 tablet (500 mg total) by mouth 2 (two) times daily., Disp: 14 tablet, Rfl: 0 .  fluticasone (FLONASE) 50 MCG/ACT nasal spray, Place 1 spray into both nostrils daily as needed. , Disp: , Rfl:  .  LORazepam (ATIVAN) 0.5 MG tablet, Take 1 tablet (0.5 mg total) by mouth every 6 (six) hours as needed (Nausea or vomiting). (Patient not taking: Reported on 05/22/2018), Disp: 30 tablet, Rfl: 0 .  magic mouthwash SOLN, Take 5 mLs by mouth 4 (four) times daily as needed for mouth pain. (Patient not taking: Reported on 06/06/2018), Disp: 240 mL, Rfl: 0 .  naproxen sodium (ALEVE) 220 MG tablet, Take by mouth., Disp: , Rfl:  .  ondansetron (ZOFRAN) 8 MG tablet, Take 1 tablet (8 mg total) by mouth 2 (two) times daily as needed (Nausea or vomiting). (Patient not taking: Reported on 05/22/2018), Disp: 30 tablet, Rfl: 1 .  prochlorperazine (COMPAZINE) 10 MG tablet, Take 1 tablet (10 mg total) by mouth every 6 (six) hours as needed (Nausea or vomiting). (Patient not taking: Reported on 05/22/2018), Disp: 30 tablet, Rfl: 1 .  Saline 0.2 % SOLN, , Disp: , Rfl:   Physical exam:  Vitals:   06/06/18 0900  BP: 107/69  Pulse: 87  Resp: 18  Temp: (!) 97.5 F (36.4 C)  TempSrc: Tympanic  Weight: 120 lb (54.4 kg)  Height: 5' 1.02" (1.55 m)   Physical Exam  Constitutional: She is oriented to person, place, and time.  Elderly frail lady sitting in a wheelchair.  Appears in no acute distress  HENT:  Head: Normocephalic and atraumatic.  Eyes: Pupils are equal, round, and reactive to light. EOM are normal.  Neck: Normal range of motion.  Cardiovascular: Normal rate, regular rhythm and normal heart sounds.  Pulmonary/Chest: Effort normal and breath sounds normal.  Abdominal: Soft. Bowel sounds are normal.  Perianal area was examined and there was mild erythema without overt ulceration noted.  There is dark brown liquid stool which is oozing out of her anus  Neurological: She is alert and oriented to person,  place, and time.  Skin: Skin is warm and dry.     CMP Latest Ref Rng & Units 06/05/2018  Glucose 70 - 99 mg/dL 125(H)  BUN 8 - 23 mg/dL 18  Creatinine 0.44 - 1.00 mg/dL 0.78  Sodium 135 - 145 mmol/L 136  Potassium 3.5 - 5.1 mmol/L 3.7  Chloride 98 - 111 mmol/L 102  CO2 22 - 32 mmol/L 25  Calcium 8.9 - 10.3 mg/dL 9.5  Total Protein 6.5 - 8.1 g/dL 5.9(L)  Total Bilirubin 0.3 - 1.2 mg/dL 1.4(H)  Alkaline Phos 38 - 126 U/L 64  AST 15 - 41 U/L 15  ALT 0 - 44 U/L 11   CBC Latest Ref Rng & Units 06/05/2018  WBC 3.6 - 11.0 K/uL 3.6  Hemoglobin 12.0 - 16.0 g/dL 6.7(L)  Hematocrit 35.0 - 47.0 % 19.8(L)  Platelets 150 - 400 K/uL 23(LL)    Assessment and plan- Patient is a 84 y.o. female with newly diagnosed AML currently on vidaza and venetoclax  Patient received 1 unit of PRBC yesterday and 1 unit of platelets earlier this week.  She will receive her second unit of PRBC today.  She will be undergoing repeat bone marrow biopsy on 06/10/2018 at [facility] followed by visit with Dr. Janene Madeira on 06/13/2018.  I will see her back on 06/19/2018.  She starts her third cycle of Vidaza on 06/18/2018 for 7 continuous days excluding the weekend.  She will be getting CMP checked once a week and CBC for possible blood and or platelet transfusion twice a week.    Her latest CMP showed that her bilirubin was mildly elevated at 1.4.  AST and ALT are normal.  Continue to monitor  I have asked her to stop taking her oral potassium at this time but try to take her oral Valtrex as well as p.o. ciprofloxacin.  Her white count is now normal and she is no longer neutropenic and it remains to be seen if there are any residual blasts on her repeat bone marrow biopsy next week.  She will also continue her venetoclax 400 mg daily  She will continue Desitin cream for peri anal irritation which is currently mild and there is no evidence of ulceration on today's exam.  I have prescribed her 5 mg of Zyprexa to be taken at night that  might help her with her appetite as well as nausea.  We may have to consider dose reductions especially with venetoclax if she does attain remission in her bone marrow next week given her ongoing symptoms of fatigue and nausea     Visit Diagnosis 1. Symptomatic anemia   2. Acute myeloid leukemia not having achieved remission ([place])   3. High risk medication use   4. Pancytopenia due to antineoplastic chemotherapy [facility])      Dr. Randa Evens, MD, MPH [facility] at [facility] [phone] 06/06/2018 2:29 PM

## 2018-06-06 NOTE — Telephone Encounter (Signed)
Patient has an appt on Monday at 8:40 with Dr. Darnell Level.  She said she has leukemia and does not think she needs to keep this appt because she is seeing other docs who are treating her for leukemia.  Pt wants to know if she does need to come in to see Dr. Darnell Level.

## 2018-06-07 LAB — BPAM RBC
Blood Product Expiration Date: 201909182359
Blood Product Expiration Date: 201909182359
ISSUE DATE / TIME: 201908221528
ISSUE DATE / TIME: 201908231127
Unit Type and Rh: 6200
Unit Type and Rh: 6200

## 2018-06-07 LAB — TYPE AND SCREEN
ABO/RH(D): A POS
ANTIBODY SCREEN: NEGATIVE
Unit division: 0
Unit division: 0

## 2018-06-09 ENCOUNTER — Ambulatory Visit: Payer: Self-pay | Admitting: Family Medicine

## 2018-06-09 NOTE — Telephone Encounter (Signed)
Patient requesting to speak with you. Please review. Thanks!

## 2018-06-09 NOTE — Telephone Encounter (Signed)
Pt stated she never heard back if she needed to keep the apt or not. Pt stated she just doesn't feel like she can make the appt this morning. Pt is requesting to speak with Dr. Rosanna Randy for a moment. Pt stated that she would just like to say hello and request Dr. Rosanna Randy return her call. Please advise. Thanks TNP

## 2018-06-10 ENCOUNTER — Ambulatory Visit: Payer: Self-pay | Admitting: Family Medicine

## 2018-06-10 DIAGNOSIS — C9200 Acute myeloblastic leukemia, not having achieved remission: Secondary | ICD-10-CM | POA: Diagnosis not present

## 2018-06-10 DIAGNOSIS — E43 Unspecified severe protein-calorie malnutrition: Secondary | ICD-10-CM | POA: Insufficient documentation

## 2018-06-12 DIAGNOSIS — C9200 Acute myeloblastic leukemia, not having achieved remission: Secondary | ICD-10-CM | POA: Diagnosis not present

## 2018-06-12 DIAGNOSIS — Z79899 Other long term (current) drug therapy: Secondary | ICD-10-CM | POA: Diagnosis not present

## 2018-06-12 DIAGNOSIS — C9201 Acute myeloblastic leukemia, in remission: Secondary | ICD-10-CM | POA: Diagnosis not present

## 2018-06-12 DIAGNOSIS — E876 Hypokalemia: Secondary | ICD-10-CM | POA: Diagnosis not present

## 2018-06-13 ENCOUNTER — Telehealth: Payer: Self-pay | Admitting: *Deleted

## 2018-06-13 DIAGNOSIS — C9200 Acute myeloblastic leukemia, not having achieved remission: Secondary | ICD-10-CM

## 2018-06-13 DIAGNOSIS — T451X5A Adverse effect of antineoplastic and immunosuppressive drugs, initial encounter: Secondary | ICD-10-CM

## 2018-06-13 DIAGNOSIS — R531 Weakness: Secondary | ICD-10-CM

## 2018-06-13 DIAGNOSIS — D649 Anemia, unspecified: Secondary | ICD-10-CM

## 2018-06-13 DIAGNOSIS — D61810 Antineoplastic chemotherapy induced pancytopenia: Secondary | ICD-10-CM

## 2018-06-13 NOTE — Telephone Encounter (Signed)
Crystal Carpenter in the [place] lab sent me a message from pt that she wants walker with wheels and seat and basket.I finally got the correct language to put order on paper and send to either the patient or I could send it to a home health company to get the DME.  I called and spoke to husband and told him that I can mail the RX to her house  Or pick a DME/ home health company to get it . He says to pick a company and I told him we usually we use [facility] and he is good with that and I will send it to them

## 2018-06-15 DIAGNOSIS — C9200 Acute myeloblastic leukemia, not having achieved remission: Secondary | ICD-10-CM | POA: Diagnosis not present

## 2018-06-15 DIAGNOSIS — E876 Hypokalemia: Secondary | ICD-10-CM | POA: Diagnosis not present

## 2018-06-17 ENCOUNTER — Telehealth: Payer: Self-pay | Admitting: *Deleted

## 2018-06-17 NOTE — Telephone Encounter (Signed)
Daughter called to ask if pt appt would changes sicne she is not starting the vidaza this wed. And if she should start the oral pills that [facility] gave her or hold them.I called her back and explained that she has to hold pills from [facility] until she starts vidaza.  We only have her down for labs and poss. Blood this week. Then next week poss. Start vidaza on 9/9 Monday.  The blasts dec. In her last bone marrow bx and we are waiting to see if labs come back better on her own since bone marrow bx showed less blasts. She has new info and will go over it with the patient and the people driving her here

## 2018-06-18 ENCOUNTER — Inpatient Hospital Stay: Payer: Medicare Other | Attending: Oncology

## 2018-06-18 ENCOUNTER — Inpatient Hospital Stay: Payer: Medicare Other

## 2018-06-18 ENCOUNTER — Other Ambulatory Visit: Payer: Self-pay

## 2018-06-18 VITALS — BP 113/68 | HR 70 | Temp 97.3°F | Resp 20

## 2018-06-18 DIAGNOSIS — T451X5A Adverse effect of antineoplastic and immunosuppressive drugs, initial encounter: Secondary | ICD-10-CM

## 2018-06-18 DIAGNOSIS — D696 Thrombocytopenia, unspecified: Secondary | ICD-10-CM | POA: Diagnosis not present

## 2018-06-18 DIAGNOSIS — D61810 Antineoplastic chemotherapy induced pancytopenia: Secondary | ICD-10-CM

## 2018-06-18 DIAGNOSIS — D649 Anemia, unspecified: Secondary | ICD-10-CM

## 2018-06-18 DIAGNOSIS — C9200 Acute myeloblastic leukemia, not having achieved remission: Secondary | ICD-10-CM

## 2018-06-18 LAB — CBC WITH DIFFERENTIAL/PLATELET
Basophils Absolute: 0 10*3/uL (ref 0–0.1)
Basophils Relative: 2 %
EOS ABS: 0 10*3/uL (ref 0–0.7)
EOS PCT: 0 %
HCT: 23.3 % — ABNORMAL LOW (ref 35.0–47.0)
Hemoglobin: 7.8 g/dL — ABNORMAL LOW (ref 12.0–16.0)
LYMPHS ABS: 0.8 10*3/uL — AB (ref 1.0–3.6)
LYMPHS PCT: 57 %
MCH: 28.8 pg (ref 26.0–34.0)
MCHC: 33.6 g/dL (ref 32.0–36.0)
MCV: 85.7 fL (ref 80.0–100.0)
MONOS PCT: 24 %
Monocytes Absolute: 0.4 10*3/uL (ref 0.2–0.9)
Neutro Abs: 0.2 10*3/uL — ABNORMAL LOW (ref 1.4–6.5)
Neutrophils Relative %: 17 %
PLATELETS: 11 10*3/uL — AB (ref 150–400)
RBC: 2.72 MIL/uL — AB (ref 3.80–5.20)
RDW: 14.7 % — AB (ref 11.5–14.5)
WBC: 1.4 10*3/uL — CL (ref 3.6–11.0)

## 2018-06-18 LAB — COMPREHENSIVE METABOLIC PANEL
ALBUMIN: 3.6 g/dL (ref 3.5–5.0)
ALT: 12 U/L (ref 0–44)
AST: 13 U/L — AB (ref 15–41)
Alkaline Phosphatase: 66 U/L (ref 38–126)
Anion gap: 9 (ref 5–15)
BUN: 21 mg/dL (ref 8–23)
CHLORIDE: 103 mmol/L (ref 98–111)
CO2: 23 mmol/L (ref 22–32)
CREATININE: 0.55 mg/dL (ref 0.44–1.00)
Calcium: 9.4 mg/dL (ref 8.9–10.3)
GFR calc Af Amer: >60 mL/min (ref >60)
GFR calc non Af Amer: >60 mL/min (ref >60)
GLUCOSE: 121 mg/dL — AB (ref 70–99)
POTASSIUM: 3.5 mmol/L (ref 3.5–5.1)
Sodium: 135 mmol/L (ref 135–145)
Total Bilirubin: 0.7 mg/dL (ref 0.3–1.2)
Total Protein: 6.5 g/dL (ref 6.5–8.1)

## 2018-06-18 MED ORDER — ACETAMINOPHEN 325 MG PO TABS
650.0000 mg | ORAL_TABLET | Freq: Once | ORAL | Status: AC
  Administered 2018-06-18: 650 mg via ORAL
  Filled 2018-06-18: qty 2

## 2018-06-18 MED ORDER — SODIUM CHLORIDE 0.9% IV SOLUTION
250.0000 mL | Freq: Once | INTRAVENOUS | Status: AC
  Administered 2018-06-18: 250 mL via INTRAVENOUS
  Filled 2018-06-18: qty 250

## 2018-06-19 ENCOUNTER — Ambulatory Visit: Payer: Medicare Other

## 2018-06-19 ENCOUNTER — Encounter: Payer: Self-pay | Admitting: Oncology

## 2018-06-19 ENCOUNTER — Ambulatory Visit: Payer: Medicare Other | Admitting: Oncology

## 2018-06-19 LAB — SAMPLE TO BLOOD BANK

## 2018-06-19 LAB — PREPARE PLATELET PHERESIS: Unit division: 0

## 2018-06-19 LAB — BPAM PLATELET PHERESIS
Blood Product Expiration Date: 201909052359
ISSUE DATE / TIME: 201909041131
Unit Type and Rh: 5100

## 2018-06-20 ENCOUNTER — Telehealth: Payer: Self-pay | Admitting: *Deleted

## 2018-06-20 ENCOUNTER — Inpatient Hospital Stay (HOSPITAL_BASED_OUTPATIENT_CLINIC_OR_DEPARTMENT_OTHER): Payer: Medicare Other | Admitting: Oncology

## 2018-06-20 ENCOUNTER — Encounter: Payer: Self-pay | Admitting: Oncology

## 2018-06-20 ENCOUNTER — Inpatient Hospital Stay: Payer: Medicare Other

## 2018-06-20 VITALS — BP 119/70 | HR 68 | Temp 98.1°F | Resp 18 | Ht 61.02 in | Wt 121.7 lb

## 2018-06-20 DIAGNOSIS — Z853 Personal history of malignant neoplasm of breast: Secondary | ICD-10-CM

## 2018-06-20 DIAGNOSIS — I10 Essential (primary) hypertension: Secondary | ICD-10-CM | POA: Diagnosis not present

## 2018-06-20 DIAGNOSIS — D61818 Other pancytopenia: Secondary | ICD-10-CM | POA: Diagnosis not present

## 2018-06-20 DIAGNOSIS — C9201 Acute myeloblastic leukemia, in remission: Secondary | ICD-10-CM | POA: Diagnosis not present

## 2018-06-20 DIAGNOSIS — D649 Anemia, unspecified: Secondary | ICD-10-CM

## 2018-06-20 DIAGNOSIS — D696 Thrombocytopenia, unspecified: Secondary | ICD-10-CM | POA: Diagnosis not present

## 2018-06-20 LAB — CBC WITH DIFFERENTIAL/PLATELET
BASOS PCT: 2 %
Basophils Absolute: 0 10*3/uL (ref 0–0.1)
Eosinophils Absolute: 0 10*3/uL (ref 0–0.7)
Eosinophils Relative: 0 %
HEMATOCRIT: 21.5 % — AB (ref 35.0–47.0)
HEMOGLOBIN: 7.2 g/dL — AB (ref 12.0–16.0)
Lymphocytes Relative: 52 %
Lymphs Abs: 1.1 10*3/uL (ref 1.0–3.6)
MCH: 28.5 pg (ref 26.0–34.0)
MCHC: 33.7 g/dL (ref 32.0–36.0)
MCV: 84.7 fL (ref 80.0–100.0)
Monocytes Absolute: 0.5 10*3/uL (ref 0.2–0.9)
Monocytes Relative: 26 %
NEUTROS ABS: 0.4 10*3/uL — AB (ref 1.4–6.5)
NEUTROS PCT: 20 %
Platelets: 26 10*3/uL — CL (ref 150–400)
RBC: 2.54 MIL/uL — ABNORMAL LOW (ref 3.80–5.20)
RDW: 15.3 % — ABNORMAL HIGH (ref 11.5–14.5)
WBC: 2 10*3/uL — AB (ref 3.6–11.0)

## 2018-06-20 LAB — PREPARE RBC (CROSSMATCH)

## 2018-06-20 MED ORDER — SODIUM CHLORIDE 0.9% IV SOLUTION
250.0000 mL | Freq: Once | INTRAVENOUS | Status: AC
  Administered 2018-06-20: 250 mL via INTRAVENOUS
  Filled 2018-06-20: qty 250

## 2018-06-20 MED ORDER — SODIUM CHLORIDE 0.9% FLUSH
3.0000 mL | INTRAVENOUS | Status: AC | PRN
  Filled 2018-06-20: qty 3

## 2018-06-20 MED ORDER — ACETAMINOPHEN 325 MG PO TABS
650.0000 mg | ORAL_TABLET | Freq: Once | ORAL | Status: AC
  Administered 2018-06-20: 650 mg via ORAL
  Filled 2018-06-20 (×2): qty 2

## 2018-06-20 MED ORDER — HEPARIN SOD (PORK) LOCK FLUSH 100 UNIT/ML IV SOLN: 500.0000 [IU] | Freq: Every day | INTRAVENOUS | Status: AC | PRN

## 2018-06-20 MED ORDER — SODIUM CHLORIDE 0.9% FLUSH
10.0000 mL | INTRAVENOUS | Status: AC | PRN
  Filled 2018-06-20: qty 10

## 2018-06-20 NOTE — Telephone Encounter (Signed)
I called patient's house to let them know that I checked into the walker and I sent the order into [facility] and got a message today that because pt got a walker in 12/2017 she does not qualify for another one with medicare in the same year. The husband states that the pt is having a nap and he will tell her after she wakes up.

## 2018-06-20 NOTE — Progress Notes (Signed)
`  no complaints today

## 2018-06-20 NOTE — Progress Notes (Signed)
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 She has been referred to [place] for anemia. Recent CBC from 09/20/2017 showed white count of 5.6, H&H of 9.6/29.8 and a platelet count of 105. TSH was normal at 1.11. CMP was within normal limits. Serum iron was normal at 103. B12 levels were elevated at 1905 and folate was normal at greater than 24. Stool FOBT was negative.  Patient lives with her 48 year old husband and is independent of her ADLs and IADLs. She is still driving. She does feel fatigued on and off but today she reports feeling well. She denies any blood in her stool or urine.  Results of blood work from 10/21/2017 were as follows: CBC showed white count of 10.6, H&H of 10.1/31.9 with a platelet count of 210. Iron studies, haptoglobin and reticulocyte count was normal. Multiple myeloma panel showed no monoclonal protein and IFE was normal  Patients hb dropped down to 6.4 in may 2019. She received 2 units of prbc transfusion. She also had repeat anemia work up which was unremarkable. She then had progressive leucocytosis with wbc in 20's and left shift. She underwent bone marrow biopsy on 04/03/18. Noted to have 19% myeloid blasts in the marrow and  20% blasts in peripheral blood consistent with acute myeloid leukemia  NGS panel showed NF1 but no other mutations. She was seen by Dr. Janene Madeira at [facility]. She is curently on venetoclax. She is receiving [place] vidaza 7 days a month 1st dose was on 04/23/18  Bone marrow biopsy on day 20 postvidazastill showed 19% blasts.  Patient was continued on Vidaza and venetoclax and repeat bone marrow biopsy at 2 months showed less than 5% blasts.  Plan at this time is to hold when it took lacks and Vidaza until [facility] improves and platelet counts are more than 50.  Interval history-she reports doing well at home.  She is not having any more diarrhea or irritation around her anus.  She continues to be independent of her ADLs.  She reports some fatigue but denies other complaints  ECOG PS- 2 Pain scale- 0 Opioid associated constipation- no  Review of systems- Review of Systems  Constitutional: Positive for malaise/fatigue. Negative for chills, fever and weight loss.  HENT: Negative for congestion, ear discharge and nosebleeds.   Eyes: Negative for blurred vision.  Respiratory: Negative for cough, hemoptysis, sputum production, shortness of breath and wheezing.   Cardiovascular: Negative for chest pain, palpitations, orthopnea and claudication.  Gastrointestinal: Negative for abdominal pain, blood in stool, constipation, diarrhea, heartburn, melena, nausea and vomiting.  Genitourinary: Negative for dysuria, flank pain, frequency, hematuria and urgency.  Musculoskeletal: Negative for back pain, joint pain and myalgias.  Skin: Negative for rash.  Neurological: Negative for dizziness, tingling, focal weakness, seizures, weakness and headaches.  Endo/Heme/Allergies: Does not bruise/bleed easily.  Psychiatric/Behavioral: Negative for depression and suicidal ideas.  The patient does not have insomnia.        Allergies  Allergen Reactions  . Tape Rash  . Gabapentin     Headache, "felt weird in the head"  .  Sulfa Antibiotics   . Allopurinol Rash  . Shellfish Allergy Rash     Past Medical History:  Diagnosis Date  . Arthritis   . Cancer [facility]) July 2014   T2,N2a, Modified radical mastectomy. 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 COPD (chronic obstructive pulmonary disease) ([place])   . Degenerative disc disease, lumbar   . Hypertension   . Malignant neoplasm of breast (female), unspecified site July 2014   Her case was presented at the [facility] tumor board in July 2014. Recommendations were for chest wall/peripheral lymphatic radiation if metastatic disease was not identified, anti-estrogen therapy only if additional metastatic disease was detected on a PET CT. No plans for adjuvant chemotherapy if metastatic disease was identified was recommended.  . Osteoporosis   . Sciatic pain 2015  . Spinal stenosis      Past Surgical History:  Procedure Laterality Date  . ABDOMINAL HYSTERECTOMY    . BREAST SURGERY Left 04-28-13   left mastectomy, SN biopsy  . EPIDURAL BLOCK INJECTION  2015   x3    Social History   Socioeconomic History  . Marital status: Married    Spouse name: Not on file  . Number of children: Not on file  . Years of education: Not on file  . Highest education level: Not on file  Occupational History  . Not on file  Social Needs  . Financial resource strain: Not on file  . Food insecurity:    Worry: Not on file    Inability: Not on file  . Transportation needs:    Medical: Not on file    Non-medical: Not on file  Tobacco Use  . Smoking status: Never Smoker  . Smokeless tobacco: Never Used  Substance and Sexual Activity  . Alcohol use: No  . Drug use: No  . Sexual activity: Not on file  Lifestyle  . Physical activity:    Days per week: Not on file    Minutes per session: Not on file  . Stress: Not on file  Relationships  . Social connections:    Talks on phone: Not on file    Gets together: Not on file    Attends religious service: Not  on file    Active member of club or organization: Not on file    Attends meetings of clubs or organizations: Not on file    Relationship status: Not on file  . Intimate partner violence:    Fear of current or ex partner: Not on file    Emotionally abused: Not on file    Physically abused: Not on file    Forced sexual activity: Not on file  Other Topics Concern  . Not on file  Social History Narrative  . Not on file    Family History  Problem Relation Age of Onset  . Stroke Mother   . Heart disease Father   . Stroke Father   . Parkinson's disease Sister   . Parkinson's disease Brother      Current Outpatient Medications:  .  alendronate (FOSAMAX) 70 MG tablet, TAKE 1 TABLET BY MOUTH EVERY 7 DAYS. TAKE WITH A FULL GLASS OF WATER ON AN EMPTY STOMACH., Disp: 12 tablet, Rfl: 3 .  azelastine (ASTELIN) 0.1 % nasal spray, Place into  both nostrils 2 (two) times daily. Use in each nostril as directed, Disp: , Rfl:  .  b complex vitamins tablet, Take 1 tablet by mouth daily., Disp: , Rfl:  .  ciprofloxacin (CIPRO) 500 MG tablet, Take 1 tablet (500 mg total) by mouth 2 (two) times daily., Disp: 14 tablet, Rfl: 0 .  docusate sodium (COLACE) 100 MG capsule, Take 100 mg by mouth daily., Disp: , Rfl:  .  famotidine (PEPCID) 10 MG tablet, Take 10 mg by mouth daily as needed for heartburn or indigestion., Disp: , Rfl:  .  fluticasone (FLONASE) 50 MCG/ACT nasal spray, Place 1 spray into both nostrils daily as needed. , Disp: , Rfl:  .  hydrochlorothiazide (HYDRODIURIL) 25 MG tablet, TAKE 1 TABLET BY MOUTH EVERY DAY, Disp: 90 tablet, Rfl: 3 .  LORazepam (ATIVAN) 0.5 MG tablet, Take 1 tablet (0.5 mg total) by mouth every 6 (six) hours as needed (Nausea or vomiting). (Patient not taking: Reported on 05/22/2018), Disp: 30 tablet, Rfl: 0 .  magic mouthwash SOLN, Take 5 mLs by mouth 4 (four) times daily as needed for mouth pain. (Patient not taking: Reported on 06/06/2018), Disp: 240 mL, Rfl: 0 .  naproxen  sodium (ALEVE) 220 MG tablet, Take by mouth., Disp: , Rfl:  .  OLANZapine (ZYPREXA) 5 MG tablet, Take 1 tablet (5 mg total) by mouth at bedtime., Disp: 30 tablet, Rfl: 0 .  ondansetron (ZOFRAN) 8 MG tablet, Take 1 tablet (8 mg total) by mouth 2 (two) times daily as needed (Nausea or vomiting). (Patient not taking: Reported on 05/22/2018), Disp: 30 tablet, Rfl: 1 .  potassium chloride (MICRO-K) 10 MEQ CR capsule, TAKE 1 CAPSULE BY MOUTH EVERY DAY., Disp: 90 capsule, Rfl: 3 .  prochlorperazine (COMPAZINE) 10 MG tablet, Take 1 tablet (10 mg total) by mouth every 6 (six) hours as needed (Nausea or vomiting). (Patient not taking: Reported on 05/22/2018), Disp: 30 tablet, Rfl: 1 .  Saline 0.2 % SOLN, , Disp: , Rfl:  .  sertraline (ZOLOFT) 100 MG tablet, TAKE 1 TABLET BY MOUTH EVERY DAY, Disp: 30 tablet, Rfl: 11  Physical exam:  Vitals:   06/20/18 1237  BP: 119/70  Pulse: 68  Resp: 18  Temp: 98.1 F (36.7 C)  TempSrc: Oral  Weight: 121 lb 11.2 oz (55.2 kg)  Height: 5' 1.02" (1.55 m)   Physical Exam  Constitutional: She is oriented to person, place, and time.  Frail elderly woman sitting in a wheelchair.  She appears in no acute distress  HENT:  Head: Normocephalic and atraumatic.  Eyes: Pupils are equal, round, and reactive to light. EOM are normal.  Neck: Normal range of motion.  Cardiovascular: Normal rate, regular rhythm and normal heart sounds.  Pulmonary/Chest: Effort normal and breath sounds normal.  Abdominal: Soft. Bowel sounds are normal.  Neurological: She is alert and oriented to person, place, and time.  Skin: Skin is warm and dry.     CMP Latest Ref Rng & Units 06/18/2018  Glucose 70 - 99 mg/dL 121(H)  BUN 8 - 23 mg/dL 21  Creatinine 0.44 - 1.00 mg/dL 0.55  Sodium 135 - 145 mmol/L 135  Potassium 3.5 - 5.1 mmol/L 3.5  Chloride 98 - 111 mmol/L 103  CO2 22 - 32 mmol/L 23  Calcium 8.9 - 10.3 mg/dL 9.4  Total Protein 6.5 - 8.1 g/dL 6.5  Total Bilirubin 0.3 - 1.2 mg/dL 0.7    Alkaline Phos 38 - 126 U/L 66  AST 15 - 41  U/L 13(L)  ALT 0 - 44 U/L 12   CBC Latest Ref Rng & Units 06/20/2018  WBC 3.6 - 11.0 K/uL 2.0(L)  Hemoglobin 12.0 - 16.0 g/dL 7.2(L)  Hematocrit 35.0 - 47.0 % 21.5(L)  Platelets 150 - 400 K/uL 26(LL)      Assessment and plan- Patient is a 84 y.o. female with AML. vidaza and venetoclax currently on hold  Her platlet count is 23 today. anc improved to 0.4. She will continue valtrex. She is not currently on cipro prophylaxis. I will check cbc with diff next week and decide if she needs to be restarted.  Plan is to hold venetoclax and vidaza until platelets >50.   Repeat cbc with diff twice a week next week. I will see her back in 2 weeks with cbc/cmp. If platelets are >50, we will restart venetoclaz and vidaza  1 unit of prbc transfusion today  She has had a favorable response to venetoclax/vidaza so far. reepat BM biopsy at 2 months showed < 5% blasts   Visit Diagnosis 1. Symptomatic anemia   2. Acute myeloid leukemia in remission [facility])      Dr. Randa Evens, MD, MPH [employer] at [facility] [phone] 06/20/2018 9:08 AM

## 2018-06-21 LAB — TYPE AND SCREEN
ABO/RH(D): A POS
Antibody Screen: NEGATIVE
UNIT DIVISION: 0

## 2018-06-21 LAB — BPAM RBC
Blood Product Expiration Date: 201909232359
ISSUE DATE / TIME: 201909061113
Unit Type and Rh: 6200

## 2018-06-23 ENCOUNTER — Inpatient Hospital Stay: Payer: Medicare Other

## 2018-06-23 DIAGNOSIS — D649 Anemia, unspecified: Secondary | ICD-10-CM

## 2018-06-23 DIAGNOSIS — D696 Thrombocytopenia, unspecified: Secondary | ICD-10-CM | POA: Diagnosis not present

## 2018-06-23 LAB — COMPREHENSIVE METABOLIC PANEL
ALBUMIN: 3.7 g/dL (ref 3.5–5.0)
ALT: 13 U/L (ref 0–44)
ANION GAP: 8 (ref 5–15)
AST: 14 U/L — ABNORMAL LOW (ref 15–41)
Alkaline Phosphatase: 74 U/L (ref 38–126)
BILIRUBIN TOTAL: 1.2 mg/dL (ref 0.3–1.2)
BUN: 18 mg/dL (ref 8–23)
CO2: 27 mmol/L (ref 22–32)
Calcium: 9.7 mg/dL (ref 8.9–10.3)
Chloride: 103 mmol/L (ref 98–111)
Creatinine, Ser: 0.5 mg/dL (ref 0.44–1.00)
GFR calc Af Amer: >60 mL/min (ref >60)
Glucose, Bld: 117 mg/dL — ABNORMAL HIGH (ref 70–99)
POTASSIUM: 4.6 mmol/L (ref 3.5–5.1)
Sodium: 138 mmol/L (ref 135–145)
TOTAL PROTEIN: 6.4 g/dL — AB (ref 6.5–8.1)

## 2018-06-23 LAB — CBC WITH DIFFERENTIAL/PLATELET
BASOS ABS: 0 10*3/uL (ref 0–0.1)
Basophils Relative: 1 %
Eosinophils Absolute: 0 10*3/uL (ref 0–0.7)
Eosinophils Relative: 1 %
HEMATOCRIT: 27 % — AB (ref 35.0–47.0)
HEMOGLOBIN: 9.3 g/dL — AB (ref 12.0–16.0)
LYMPHS PCT: 51 %
Lymphs Abs: 1.3 10*3/uL (ref 1.0–3.6)
MCH: 28.1 pg (ref 26.0–34.0)
MCHC: 34.4 g/dL (ref 32.0–36.0)
MCV: 81.7 fL (ref 80.0–100.0)
MONOS PCT: 28 %
Monocytes Absolute: 0.6 10*3/uL (ref 0.2–0.9)
NEUTROS PCT: 19 %
Neutro Abs: 0.4 10*3/uL — ABNORMAL LOW (ref 1.7–7.7)
Platelets: 17 10*3/uL — CL (ref 150–400)
RBC: 3.3 MIL/uL — AB (ref 3.80–5.20)
RDW: 16.1 % — ABNORMAL HIGH (ref 11.5–14.5)
WBC: 2.3 10*3/uL — AB (ref 3.6–11.0)

## 2018-06-23 LAB — SAMPLE TO BLOOD BANK

## 2018-06-23 NOTE — Progress Notes (Signed)
No blood or platelets today per Dr. Janese Banks.

## 2018-06-24 ENCOUNTER — Ambulatory Visit: Payer: Medicare Other

## 2018-06-25 ENCOUNTER — Ambulatory Visit: Payer: Medicare Other

## 2018-06-26 ENCOUNTER — Other Ambulatory Visit: Payer: Self-pay | Admitting: Oncology

## 2018-06-26 ENCOUNTER — Other Ambulatory Visit: Payer: Self-pay | Admitting: *Deleted

## 2018-06-26 ENCOUNTER — Inpatient Hospital Stay: Payer: Medicare Other

## 2018-06-26 ENCOUNTER — Telehealth: Payer: Self-pay

## 2018-06-26 DIAGNOSIS — D696 Thrombocytopenia, unspecified: Secondary | ICD-10-CM

## 2018-06-26 DIAGNOSIS — D649 Anemia, unspecified: Secondary | ICD-10-CM | POA: Diagnosis not present

## 2018-06-26 DIAGNOSIS — C9200 Acute myeloblastic leukemia, not having achieved remission: Secondary | ICD-10-CM

## 2018-06-26 LAB — CBC WITH DIFFERENTIAL/PLATELET
BASOS PCT: 1 %
Basophils Absolute: 0 10*3/uL (ref 0–0.1)
EOS ABS: 0 10*3/uL (ref 0–0.7)
Eosinophils Relative: 0 %
HEMATOCRIT: 24.9 % — AB (ref 35.0–47.0)
Hemoglobin: 8.4 g/dL — ABNORMAL LOW (ref 12.0–16.0)
LYMPHS PCT: 52 %
Lymphs Abs: 1.5 10*3/uL (ref 1.0–3.6)
MCH: 27.8 pg (ref 26.0–34.0)
MCHC: 33.7 g/dL (ref 32.0–36.0)
MCV: 82.5 fL (ref 80.0–100.0)
Monocytes Absolute: 0.8 10*3/uL (ref 0.2–0.9)
Monocytes Relative: 30 %
NEUTROS PCT: 17 %
Neutro Abs: 0.5 10*3/uL — ABNORMAL LOW (ref 1.4–6.5)
Platelets: 8 10*3/uL — CL (ref 150–400)
RBC: 3.02 MIL/uL — ABNORMAL LOW (ref 3.80–5.20)
RDW: 15.9 % — ABNORMAL HIGH (ref 11.5–14.5)
WBC: 2.8 10*3/uL — ABNORMAL LOW (ref 3.6–11.0)

## 2018-06-26 LAB — SAMPLE TO BLOOD BANK

## 2018-06-26 MED ORDER — ACETAMINOPHEN 325 MG PO TABS
650.0000 mg | ORAL_TABLET | Freq: Once | ORAL | Status: AC
  Administered 2018-06-26: 650 mg via ORAL
  Filled 2018-06-26: qty 2

## 2018-06-26 MED ORDER — SODIUM CHLORIDE 0.9% IV SOLUTION
250.0000 mL | Freq: Once | INTRAVENOUS | Status: AC
  Administered 2018-06-26: 250 mL via INTRAVENOUS
  Filled 2018-06-26: qty 250

## 2018-06-26 NOTE — Telephone Encounter (Signed)
Spoke with husband who states pt was not available at the time. Inquired about scheduling pts AWV at the end of October and husband states pt would have to take care of that and he will have her CB. Last AWV 08/07/17. -MM

## 2018-06-27 LAB — BPAM PLATELET PHERESIS
Blood Product Expiration Date: 201909131115
ISSUE DATE / TIME: 201909121208
Unit Type and Rh: 6200

## 2018-06-27 LAB — PREPARE PLATELET PHERESIS: UNIT DIVISION: 0

## 2018-06-30 ENCOUNTER — Inpatient Hospital Stay: Payer: Medicare Other

## 2018-06-30 ENCOUNTER — Other Ambulatory Visit: Payer: Self-pay

## 2018-06-30 DIAGNOSIS — D696 Thrombocytopenia, unspecified: Secondary | ICD-10-CM | POA: Diagnosis not present

## 2018-06-30 DIAGNOSIS — D649 Anemia, unspecified: Secondary | ICD-10-CM | POA: Diagnosis not present

## 2018-06-30 DIAGNOSIS — C9200 Acute myeloblastic leukemia, not having achieved remission: Secondary | ICD-10-CM

## 2018-06-30 LAB — SAMPLE TO BLOOD BANK

## 2018-06-30 LAB — CBC WITH DIFFERENTIAL/PLATELET
BASOS PCT: 1 %
Basophils Absolute: 0 10*3/uL (ref 0–0.1)
EOS ABS: 0 10*3/uL (ref 0–0.7)
EOS PCT: 1 %
HCT: 23.6 % — ABNORMAL LOW (ref 35.0–47.0)
HEMOGLOBIN: 7.9 g/dL — AB (ref 12.0–16.0)
Lymphocytes Relative: 48 %
Lymphs Abs: 1.7 10*3/uL (ref 1.0–3.6)
MCH: 27.6 pg (ref 26.0–34.0)
MCHC: 33.3 g/dL (ref 32.0–36.0)
MCV: 82.8 fL (ref 80.0–100.0)
Monocytes Absolute: 1.1 10*3/uL — ABNORMAL HIGH (ref 0.2–0.9)
Monocytes Relative: 31 %
NEUTROS PCT: 19 %
Neutro Abs: 0.6 10*3/uL — ABNORMAL LOW (ref 1.4–6.5)
PLATELETS: 36 10*3/uL — AB (ref 150–440)
RBC: 2.85 MIL/uL — AB (ref 3.80–5.20)
RDW: 15.5 % — ABNORMAL HIGH (ref 11.5–14.5)
WBC: 3.4 10*3/uL — AB (ref 3.6–11.0)

## 2018-07-03 NOTE — Progress Notes (Signed)
Hematology/Oncology Consult note [facility]  [phone] [phone]  Patient Care Team: Jerrol Banana., MD as PCP - General (Family Medicine) Bary Castilla, Forest Gleason, MD as Consulting Physician (General Surgery) Idelle Leech, [place] as Consulting Physician (Optometry) Sindy Guadeloupe, MD as Medical Oncologist (Medical Oncology)   Name of the patient: Crystal Carpenter  [id number]  1923/11/07   Date of visit: 07/03/18  Diagnosis- AML currently on vidazaandvenetoclax (treatment on hold after 2 cycles)  Chief complaint/ Reason for visit- routine f/u of pancytopenia  Heme/Onc history: patient is a 84 year old female with a past medical history significant for breast cancer, hypercholesterolemia and hypertension among other medical problems. She has been referred to [place] for anemia. Recent CBC from 09/20/2017 showed white count of 5.6, H&H of 9.6/29.8 and a platelet count of 105. TSH was normal at 1.11. CMP was within normal limits. Serum iron was normal at 103. B12 levels were elevated at 1905 and folate was normal at greater than 24. Stool FOBT was negative.  Patient lives with her 18 year old husband and is independent of her ADLs and IADLs. She is still driving. She does feel fatigued on and off but today she reports feeling well. She denies any blood in her stool or urine.  Results of blood work from 10/21/2017 were as follows: CBC showed white count of 10.6, H&H of 10.1/31.9 with a platelet count of 210. Iron studies, haptoglobin and reticulocyte count was normal. Multiple myeloma panel showed no monoclonal protein and IFE was normal  Patients hb dropped down to 6.4 in may 2019. She received 2 units of prbc transfusion. She also had repeat anemia work up which was unremarkable. She then had progressive leucocytosis with wbc in 20's and left shift. She underwent bone marrow biopsy on 04/03/18. Noted to have 19% myeloid blasts in the  marrow and 20% blasts in peripheral blood consistent with acute myeloid leukemia  NGS panel showed NF1 but no other mutations. She was seen by Dr. Janene Madeira at [facility]. She is curently on venetoclax. She is receiving [place] vidaza 7 days a month 1st dose was on 04/23/18  Bone marrow biopsy on day 20 postvidazastill showed 19% blasts.  Patient was continued on Vidaza and venetoclax and repeat bone marrow biopsy at 2 months showed less than 5% blasts.  Plan at this time is to hold when it took lacks and Vidaza until [facility] improves and platelet counts are more than 50.   Interval history- she feels better since treatments have been on hold. She has a BM every 3 days. Denies any falls or bleeding  ECOG PS- 2 Pain scale- 0 Opioid associated constipation- no  Review of systems- Review of Systems  Constitutional: Positive for malaise/fatigue.  Gastrointestinal: Positive for constipation.     Allergies  Allergen Reactions  . Tape Rash  . Gabapentin     Headache, "felt weird in the head"  . Sulfa Antibiotics   . Allopurinol Rash  . Shellfish Allergy Rash     Past Medical History:  Diagnosis Date  . Acute myeloid leukemia ([place])   . Arthritis   . Cancer [facility]) July 2014   T2,N2a, Modified radical mastectomy. 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 COPD (chronic obstructive pulmonary disease) ([place])   . Degenerative disc disease, lumbar   . Hypertension   . Malignant neoplasm of breast (female), unspecified site July 2014   Her case was presented at the [facility] tumor board in  July 2014. Recommendations were for chest wall/peripheral lymphatic radiation if metastatic disease was not identified, anti-estrogen therapy only if additional metastatic disease was detected on a PET CT. No plans for adjuvant chemotherapy if metastatic disease was identified was recommended.  . Osteoporosis   . Sciatic pain 2015  . Spinal stenosis      Past Surgical History:  Procedure Laterality  Date  . ABDOMINAL HYSTERECTOMY    . BREAST SURGERY Left 04-28-13   left mastectomy, SN biopsy  . EPIDURAL BLOCK INJECTION  2015   x3    Social History   Socioeconomic History  . Marital status: Married    Spouse name: Not on file  . Number of children: Not on file  . Years of education: Not on file  . Highest education level: Not on file  Occupational History  . Not on file  Social Needs  . Financial resource strain: Not on file  . Food insecurity:    Worry: Not on file    Inability: Not on file  . Transportation needs:    Medical: Not on file    Non-medical: Not on file  Tobacco Use  . Smoking status: Never Smoker  . Smokeless tobacco: Never Used  Substance and Sexual Activity  . Alcohol use: No  . Drug use: No  . Sexual activity: Not on file  Lifestyle  . Physical activity:    Days per week: Not on file    Minutes per session: Not on file  . Stress: Not on file  Relationships  . Social connections:    Talks on phone: Not on file    Gets together: Not on file    Attends religious service: Not on file    Active member of club or organization: Not on file    Attends meetings of clubs or organizations: Not on file    Relationship status: Not on file  . Intimate partner violence:    Fear of current or ex partner: Not on file    Emotionally abused: Not on file    Physically abused: Not on file    Forced sexual activity: Not on file  Other Topics Concern  . Not on file  Social History Narrative  . Not on file    Family History  Problem Relation Age of Onset  . Stroke Mother   . Heart disease Father   . Stroke Father   . Parkinson's disease Sister   . Parkinson's disease Brother      Current Outpatient Medications:  .  alendronate (FOSAMAX) 70 MG tablet, TAKE 1 TABLET BY MOUTH EVERY 7 DAYS. TAKE WITH A FULL GLASS OF WATER ON AN EMPTY STOMACH., Disp: 12 tablet, Rfl: 3 .  azelastine (ASTELIN) 0.1 % nasal spray, Place into both nostrils 2 (two) times daily  as needed. Use in each nostril as directed , Disp: , Rfl:  .  b complex vitamins tablet, Take 1 tablet by mouth daily., Disp: , Rfl:  .  ciprofloxacin (CIPRO) 500 MG tablet, Take 1 tablet (500 mg total) by mouth 2 (two) times daily., Disp: 14 tablet, Rfl: 0 .  docusate sodium (COLACE) 100 MG capsule, Take 100 mg by mouth daily., Disp: , Rfl:  .  famotidine (PEPCID) 10 MG tablet, Take 10 mg by mouth daily as needed for heartburn or indigestion., Disp: , Rfl:  .  fluticasone (FLONASE) 50 MCG/ACT nasal spray, Place 1 spray into both nostrils daily as needed. , Disp: , Rfl:  .  hydrochlorothiazide (  HYDRODIURIL) 25 MG tablet, TAKE 1 TABLET BY MOUTH EVERY DAY, Disp: 90 tablet, Rfl: 3 .  LORazepam (ATIVAN) 0.5 MG tablet, Take 1 tablet (0.5 mg total) by mouth every 6 (six) hours as needed (Nausea or vomiting). (Patient not taking: Reported on 05/22/2018), Disp: 30 tablet, Rfl: 0 .  magic mouthwash SOLN, Take 5 mLs by mouth 4 (four) times daily as needed for mouth pain. (Patient not taking: Reported on 06/06/2018), Disp: 240 mL, Rfl: 0 .  naproxen sodium (ALEVE) 220 MG tablet, Take 220 mg by mouth daily as needed. , Disp: , Rfl:  .  OLANZapine (ZYPREXA) 5 MG tablet, Take 1 tablet (5 mg total) by mouth at bedtime., Disp: 30 tablet, Rfl: 0 .  ondansetron (ZOFRAN) 8 MG tablet, Take 1 tablet (8 mg total) by mouth 2 (two) times daily as needed (Nausea or vomiting). (Patient not taking: Reported on 05/22/2018), Disp: 30 tablet, Rfl: 1 .  potassium chloride (MICRO-K) 10 MEQ CR capsule, TAKE 1 CAPSULE BY MOUTH EVERY DAY., Disp: 90 capsule, Rfl: 3 .  prochlorperazine (COMPAZINE) 10 MG tablet, Take 1 tablet (10 mg total) by mouth every 6 (six) hours as needed (Nausea or vomiting). (Patient not taking: Reported on 05/22/2018), Disp: 30 tablet, Rfl: 1 .  Saline 0.2 % SOLN, , Disp: , Rfl:  .  sertraline (ZOLOFT) 100 MG tablet, TAKE 1 TABLET BY MOUTH EVERY DAY (Patient not taking: Reported on 06/20/2018), Disp: 30 tablet, Rfl:  11 No current facility-administered medications for this visit.   Facility-Administered Medications Ordered in Other Visits:  .  heparin lock flush 100 unit/mL, 500 Units, Intracatheter, Daily PRN, Sindy Guadeloupe, MD .  sodium chloride flush (NS) 0.9 % injection 10 mL, 10 mL, Intracatheter, PRN, Sindy Guadeloupe, MD .  sodium chloride flush (NS) 0.9 % injection 3 mL, 3 mL, Intracatheter, PRN, Sindy Guadeloupe, MD  Physical exam:  Vitals:   07/04/18 0835  BP: (!) 129/58  Pulse: 70  Resp: 18  Temp: 97.8 F (36.6 C)  TempSrc: Oral  SpO2: 100%  Weight: 129 lb 3.2 oz (58.6 kg)  Height: 5' 1.02" (1.55 m)   Physical Exam  Constitutional: She is oriented to person, place, and time.  Frail elderly female sitting ina wheelchair  HENT:  Head: Normocephalic and atraumatic.  Eyes: Pupils are equal, round, and reactive to light. EOM are normal.  Neck: Normal range of motion.  Cardiovascular: Normal rate, regular rhythm and normal heart sounds.  Pulmonary/Chest: Effort normal and breath sounds normal.  Abdominal: Soft. Bowel sounds are normal.  Neurological: She is alert and oriented to person, place, and time.  Skin: Skin is warm and dry.     CMP Latest Ref Rng & Units 06/23/2018  Glucose 70 - 99 mg/dL 117(H)  BUN 8 - 23 mg/dL 18  Creatinine 0.44 - 1.00 mg/dL 0.50  Sodium 135 - 145 mmol/L 138  Potassium 3.5 - 5.1 mmol/L 4.6  Chloride 98 - 111 mmol/L 103  CO2 22 - 32 mmol/L 27  Calcium 8.9 - 10.3 mg/dL 9.7  Total Protein 6.5 - 8.1 g/dL 6.4(L)  Total Bilirubin 0.3 - 1.2 mg/dL 1.2  Alkaline Phos 38 - 126 U/L 74  AST 15 - 41 U/L 14(L)  ALT 0 - 44 U/L 13   CBC Latest Ref Rng & Units 06/30/2018  WBC 3.6 - 11.0 K/uL 3.4(L)  Hemoglobin 12.0 - 16.0 g/dL 7.9(L)  Hematocrit 35.0 - 47.0 % 23.6(L)  Platelets 150 - 440 K/uL 36(L)  Assessment and plan- Patient is a 84 y.o. female h/o AML s/p 2 cycles of vidaza and venetoclax. Repeat marrow showed <5 % blasts. Awaiting bone marrow  recovery  Wbc is slowly recovering anc still at 0.6. Will transfuse 1 unit of PRBC for hb of 7.5. Hold off on platelet transfusion today. Platelets have not recovered yet and staying between 15-20 and occasionally goes down to <10  Hold vidaza and venetoclax at this time until platelet counts >50  I will see her back in 1 week cbc/cmp possible blood and/or platelet transfusion.   Constipation- patient had diarrhea and stool incontinence after laxatives. As long as she having a BM Q3 days, she will not use laxatives   Visit Diagnosis 1. Thrombocytopenia ([place])   2. Symptomatic anemia   3. Acute myeloid leukemia not having achieved remission ([place])      Dr. Randa Evens, MD, MPH [facility] at [facility] [phone] 07/03/2018 9:22 AM

## 2018-07-04 ENCOUNTER — Inpatient Hospital Stay: Payer: Medicare Other

## 2018-07-04 ENCOUNTER — Inpatient Hospital Stay (HOSPITAL_BASED_OUTPATIENT_CLINIC_OR_DEPARTMENT_OTHER): Payer: Medicare Other | Admitting: Oncology

## 2018-07-04 ENCOUNTER — Encounter: Payer: Self-pay | Admitting: Oncology

## 2018-07-04 VITALS — BP 129/58 | HR 70 | Temp 97.8°F | Resp 18 | Ht 61.02 in | Wt 129.2 lb

## 2018-07-04 DIAGNOSIS — C9201 Acute myeloblastic leukemia, in remission: Secondary | ICD-10-CM | POA: Diagnosis not present

## 2018-07-04 DIAGNOSIS — D649 Anemia, unspecified: Secondary | ICD-10-CM

## 2018-07-04 DIAGNOSIS — C9200 Acute myeloblastic leukemia, not having achieved remission: Secondary | ICD-10-CM

## 2018-07-04 DIAGNOSIS — D696 Thrombocytopenia, unspecified: Secondary | ICD-10-CM

## 2018-07-04 DIAGNOSIS — Z853 Personal history of malignant neoplasm of breast: Secondary | ICD-10-CM

## 2018-07-04 DIAGNOSIS — I10 Essential (primary) hypertension: Secondary | ICD-10-CM

## 2018-07-04 LAB — SAMPLE TO BLOOD BANK

## 2018-07-04 LAB — CBC WITH DIFFERENTIAL/PLATELET
BASOS ABS: 0.1 10*3/uL (ref 0–0.1)
BASOS PCT: 2 %
EOS ABS: 0 10*3/uL (ref 0–0.7)
Eosinophils Relative: 1 %
HCT: 21.5 % — ABNORMAL LOW (ref 35.0–47.0)
Hemoglobin: 7.5 g/dL — ABNORMAL LOW (ref 12.0–16.0)
LYMPHS ABS: 1.7 10*3/uL (ref 1.0–3.6)
Lymphocytes Relative: 48 %
MCH: 28.4 pg (ref 26.0–34.0)
MCHC: 34.6 g/dL (ref 32.0–36.0)
MCV: 81.9 fL (ref 80.0–100.0)
MONO ABS: 1.2 10*3/uL — AB (ref 0.2–0.9)
Monocytes Relative: 32 %
NEUTROS ABS: 0.6 10*3/uL — AB (ref 1.4–6.5)
Neutrophils Relative %: 17 %
Platelets: 17 10*3/uL — CL (ref 150–400)
RBC: 2.63 MIL/uL — ABNORMAL LOW (ref 3.80–5.20)
RDW: 15.6 % — AB (ref 11.5–14.5)
WBC: 3.6 10*3/uL (ref 3.6–11.0)

## 2018-07-04 LAB — PREPARE RBC (CROSSMATCH)

## 2018-07-04 MED ORDER — SODIUM CHLORIDE 0.9% IV SOLUTION
250.0000 mL | Freq: Once | INTRAVENOUS | Status: AC
  Administered 2018-07-04: 250 mL via INTRAVENOUS
  Filled 2018-07-04: qty 250

## 2018-07-04 MED ORDER — ACETAMINOPHEN 325 MG PO TABS
650.0000 mg | ORAL_TABLET | Freq: Once | ORAL | Status: AC
  Administered 2018-07-04: 650 mg via ORAL
  Filled 2018-07-04: qty 2

## 2018-07-04 NOTE — Progress Notes (Signed)
Patient c/o constipation 

## 2018-07-05 LAB — TYPE AND SCREEN
ABO/RH(D): A POS
ANTIBODY SCREEN: NEGATIVE
UNIT DIVISION: 0

## 2018-07-05 LAB — BPAM RBC
BLOOD PRODUCT EXPIRATION DATE: 201910072359
ISSUE DATE / TIME: 201909201024
UNIT TYPE AND RH: 600

## 2018-07-08 DIAGNOSIS — C9201 Acute myeloblastic leukemia, in remission: Secondary | ICD-10-CM | POA: Diagnosis not present

## 2018-07-08 DIAGNOSIS — C9200 Acute myeloblastic leukemia, not having achieved remission: Secondary | ICD-10-CM | POA: Diagnosis not present

## 2018-07-08 DIAGNOSIS — Z9221 Personal history of antineoplastic chemotherapy: Secondary | ICD-10-CM | POA: Diagnosis not present

## 2018-07-08 DIAGNOSIS — Z853 Personal history of malignant neoplasm of breast: Secondary | ICD-10-CM | POA: Diagnosis not present

## 2018-07-08 DIAGNOSIS — E876 Hypokalemia: Secondary | ICD-10-CM | POA: Diagnosis not present

## 2018-07-08 DIAGNOSIS — K5900 Constipation, unspecified: Secondary | ICD-10-CM | POA: Diagnosis not present

## 2018-07-08 DIAGNOSIS — D469 Myelodysplastic syndrome, unspecified: Secondary | ICD-10-CM | POA: Diagnosis not present

## 2018-07-08 DIAGNOSIS — Z79899 Other long term (current) drug therapy: Secondary | ICD-10-CM | POA: Diagnosis not present

## 2018-07-08 DIAGNOSIS — K649 Unspecified hemorrhoids: Secondary | ICD-10-CM | POA: Diagnosis not present

## 2018-07-10 ENCOUNTER — Telehealth: Payer: Self-pay | Admitting: *Deleted

## 2018-07-10 NOTE — Telephone Encounter (Signed)
Patient called and wanted to know if she needs to come in tomorrow 927 for possible blood.  She was told that Dr. Janene Madeira at [facility] is going to give her a bone marrow biopsy on Monday, September 30 and then see her back on October 3 for results.  So she wanted to know if she should skip these this week and next week because of those appointments.  I checked with Dr. Janese Banks who is already talked to Dr. Janene Madeira at [facility] she still needs to keep her appointment for this Friday she will need to arrive here at 53 for labs and 10:00 for possible blood transfusion.  And then she also was given a new appointment to come back on October 4 at 9 AM for labs then see the doctor and then get a blood transfusion if need be.  I called patient back and went over the above information she will come in tomorrow at 930 and I will give her the appointments for next week.  Patient is agreeable to plan

## 2018-07-11 ENCOUNTER — Inpatient Hospital Stay: Payer: Medicare Other

## 2018-07-11 ENCOUNTER — Other Ambulatory Visit: Payer: Self-pay | Admitting: Oncology

## 2018-07-11 ENCOUNTER — Other Ambulatory Visit: Payer: Self-pay | Admitting: *Deleted

## 2018-07-11 ENCOUNTER — Inpatient Hospital Stay: Payer: Medicare Other | Admitting: Oncology

## 2018-07-11 DIAGNOSIS — D649 Anemia, unspecified: Secondary | ICD-10-CM | POA: Diagnosis not present

## 2018-07-11 DIAGNOSIS — D696 Thrombocytopenia, unspecified: Secondary | ICD-10-CM | POA: Diagnosis not present

## 2018-07-11 DIAGNOSIS — C9200 Acute myeloblastic leukemia, not having achieved remission: Secondary | ICD-10-CM

## 2018-07-11 LAB — CBC WITH DIFFERENTIAL/PLATELET
Basophils Absolute: 0.1 10*3/uL (ref 0–0.1)
Basophils Relative: 2 %
Eosinophils Absolute: 0 10*3/uL (ref 0–0.7)
Eosinophils Relative: 1 %
HEMATOCRIT: 23.5 % — AB (ref 35.0–47.0)
HEMOGLOBIN: 8 g/dL — AB (ref 12.0–16.0)
LYMPHS ABS: 1.8 10*3/uL (ref 1.0–3.6)
Lymphocytes Relative: 45 %
MCH: 28.3 pg (ref 26.0–34.0)
MCHC: 33.8 g/dL (ref 32.0–36.0)
MCV: 83.7 fL (ref 80.0–100.0)
MONOS PCT: 30 %
Monocytes Absolute: 1.1 10*3/uL — ABNORMAL HIGH (ref 0.2–0.9)
NEUTROS ABS: 0.8 10*3/uL — AB (ref 1.4–6.5)
NEUTROS PCT: 22 %
Platelets: 18 10*3/uL — CL (ref 150–440)
RBC: 2.81 MIL/uL — ABNORMAL LOW (ref 3.80–5.20)
RDW: 16 % — ABNORMAL HIGH (ref 11.5–14.5)
WBC: 3.8 10*3/uL (ref 3.6–11.0)

## 2018-07-11 LAB — COMPREHENSIVE METABOLIC PANEL
ALT: 13 U/L (ref 0–44)
AST: 15 U/L (ref 15–41)
Albumin: 3.5 g/dL (ref 3.5–5.0)
Alkaline Phosphatase: 84 U/L (ref 38–126)
Anion gap: 7 (ref 5–15)
BILIRUBIN TOTAL: 0.9 mg/dL (ref 0.3–1.2)
BUN: 23 mg/dL (ref 8–23)
CHLORIDE: 105 mmol/L (ref 98–111)
CO2: 26 mmol/L (ref 22–32)
CREATININE: 0.6 mg/dL (ref 0.44–1.00)
Calcium: 9.5 mg/dL (ref 8.9–10.3)
GFR calc Af Amer: >60 mL/min (ref >60)
GFR calc non Af Amer: >60 mL/min (ref >60)
Glucose, Bld: 109 mg/dL — ABNORMAL HIGH (ref 70–99)
Potassium: 3.7 mmol/L (ref 3.5–5.1)
Sodium: 138 mmol/L (ref 135–145)
TOTAL PROTEIN: 6.2 g/dL — AB (ref 6.5–8.1)

## 2018-07-11 LAB — SAMPLE TO BLOOD BANK

## 2018-07-11 MED ORDER — SODIUM CHLORIDE 0.9% IV SOLUTION
250.0000 mL | Freq: Once | INTRAVENOUS | Status: AC
  Administered 2018-07-11: 250 mL via INTRAVENOUS
  Filled 2018-07-11: qty 250

## 2018-07-11 MED ORDER — ACETAMINOPHEN 325 MG PO TABS
650.0000 mg | ORAL_TABLET | Freq: Once | ORAL | Status: AC
  Administered 2018-07-11: 650 mg via ORAL
  Filled 2018-07-11: qty 2

## 2018-07-11 NOTE — Telephone Encounter (Signed)
LM with husband to have pt CB to schedule her AWV.Left my direct # for CB. -MM

## 2018-07-12 LAB — BPAM PLATELET PHERESIS
Blood Product Expiration Date: 201909281105
ISSUE DATE / TIME: 201909271151
Unit Type and Rh: 6200

## 2018-07-12 LAB — PREPARE PLATELET PHERESIS: UNIT DIVISION: 0

## 2018-07-14 ENCOUNTER — Inpatient Hospital Stay: Payer: Medicare Other

## 2018-07-14 DIAGNOSIS — E876 Hypokalemia: Secondary | ICD-10-CM | POA: Diagnosis not present

## 2018-07-14 DIAGNOSIS — C9200 Acute myeloblastic leukemia, not having achieved remission: Secondary | ICD-10-CM | POA: Diagnosis not present

## 2018-07-15 ENCOUNTER — Ambulatory Visit: Payer: Medicare Other

## 2018-07-16 ENCOUNTER — Ambulatory Visit: Payer: Medicare Other

## 2018-07-16 ENCOUNTER — Telehealth: Payer: Self-pay | Admitting: *Deleted

## 2018-07-16 NOTE — Telephone Encounter (Signed)
Pt. Called and asked if she should come this Friday since she is going to see [facility] on Thursday this week and will get labs and see  Dr. Janene Madeira.  I told her to go tom. And if she gets blood or sees Dr. Janene Madeira just check with him about if she needs to come Friday. She can just call me and I can cancel at any time. She will let me know

## 2018-07-17 ENCOUNTER — Ambulatory Visit: Payer: Medicare Other

## 2018-07-17 DIAGNOSIS — Z79899 Other long term (current) drug therapy: Secondary | ICD-10-CM | POA: Diagnosis not present

## 2018-07-17 DIAGNOSIS — C9200 Acute myeloblastic leukemia, not having achieved remission: Secondary | ICD-10-CM | POA: Diagnosis not present

## 2018-07-17 DIAGNOSIS — Z23 Encounter for immunization: Secondary | ICD-10-CM | POA: Diagnosis not present

## 2018-07-17 DIAGNOSIS — D469 Myelodysplastic syndrome, unspecified: Secondary | ICD-10-CM | POA: Diagnosis not present

## 2018-07-18 ENCOUNTER — Inpatient Hospital Stay: Payer: Medicare Other

## 2018-07-18 ENCOUNTER — Inpatient Hospital Stay: Payer: Medicare Other | Admitting: Oncology

## 2018-07-18 ENCOUNTER — Ambulatory Visit: Payer: Medicare Other

## 2018-07-18 ENCOUNTER — Telehealth: Payer: Self-pay | Admitting: *Deleted

## 2018-07-18 NOTE — Telephone Encounter (Signed)
Patient and let her know that next week she will be coming on the eighth she will arrive at 915 for her labs and possible blood after if needed.  The next appointment will be 1011 which is Friday she need to arrive for her labs today 15th see the doctor afterwards and then if she needs blood transfusion will be after the doctor visit.  Patient wrote all visit dates and times down she will be there she has no  other questions.

## 2018-07-21 NOTE — Telephone Encounter (Signed)
Pt states she is going through Chemo right now and would like to wait. Pt requests a CB at the end of October 2019. Note made. -MM

## 2018-07-22 ENCOUNTER — Inpatient Hospital Stay: Payer: Medicare Other

## 2018-07-22 ENCOUNTER — Inpatient Hospital Stay: Payer: Medicare Other | Attending: Oncology

## 2018-07-22 ENCOUNTER — Other Ambulatory Visit: Payer: Self-pay

## 2018-07-22 DIAGNOSIS — Z853 Personal history of malignant neoplasm of breast: Secondary | ICD-10-CM | POA: Diagnosis not present

## 2018-07-22 DIAGNOSIS — D649 Anemia, unspecified: Secondary | ICD-10-CM | POA: Insufficient documentation

## 2018-07-22 DIAGNOSIS — C9200 Acute myeloblastic leukemia, not having achieved remission: Secondary | ICD-10-CM | POA: Diagnosis not present

## 2018-07-22 DIAGNOSIS — K5900 Constipation, unspecified: Secondary | ICD-10-CM | POA: Diagnosis not present

## 2018-07-22 DIAGNOSIS — I10 Essential (primary) hypertension: Secondary | ICD-10-CM | POA: Diagnosis not present

## 2018-07-22 DIAGNOSIS — Z5111 Encounter for antineoplastic chemotherapy: Secondary | ICD-10-CM | POA: Insufficient documentation

## 2018-07-22 DIAGNOSIS — R3911 Hesitancy of micturition: Secondary | ICD-10-CM | POA: Diagnosis not present

## 2018-07-22 DIAGNOSIS — Z9012 Acquired absence of left breast and nipple: Secondary | ICD-10-CM | POA: Diagnosis not present

## 2018-07-22 DIAGNOSIS — K5641 Fecal impaction: Secondary | ICD-10-CM | POA: Diagnosis not present

## 2018-07-22 DIAGNOSIS — K5901 Slow transit constipation: Secondary | ICD-10-CM | POA: Diagnosis not present

## 2018-07-22 LAB — CBC WITH DIFFERENTIAL/PLATELET
BASOS ABS: 0 10*3/uL (ref 0–0.1)
Band Neutrophils: 1 %
Basophils Relative: 0 %
Blasts: 0 %
Eosinophils Absolute: 0.1 10*3/uL (ref 0–0.7)
Eosinophils Relative: 1 %
HEMATOCRIT: 30.3 % — AB (ref 35.0–47.0)
Hemoglobin: 10.4 g/dL — ABNORMAL LOW (ref 12.0–16.0)
LYMPHS PCT: 69 %
Lymphs Abs: 4 10*3/uL — ABNORMAL HIGH (ref 1.0–3.6)
MCH: 29.4 pg (ref 26.0–34.0)
MCHC: 34.2 g/dL (ref 32.0–36.0)
MCV: 86.1 fL (ref 80.0–100.0)
METAMYELOCYTES PCT: 1 %
MONOS PCT: 18 %
Monocytes Absolute: 1 10*3/uL — ABNORMAL HIGH (ref 0.2–0.9)
Myelocytes: 0 %
NEUTROS ABS: 0.7 10*3/uL — AB (ref 1.4–6.5)
Neutrophils Relative %: 10 %
Other: 0 %
PROMYELOCYTES RELATIVE: 0 %
Platelets: 20 10*3/uL — CL (ref 150–400)
RBC: 3.52 MIL/uL — AB (ref 3.80–5.20)
RDW: 16.1 % — ABNORMAL HIGH (ref 11.5–14.5)
WBC: 5.8 10*3/uL (ref 3.6–11.0)
nRBC: 1 /100 WBC — ABNORMAL HIGH

## 2018-07-22 LAB — SAMPLE TO BLOOD BANK

## 2018-07-22 NOTE — Progress Notes (Unsigned)
Per Dr Crystal Carpenter, no transfusions today. Hbg 10.4, platelets 20

## 2018-07-24 ENCOUNTER — Encounter: Payer: Self-pay | Admitting: Oncology

## 2018-07-25 ENCOUNTER — Inpatient Hospital Stay (HOSPITAL_BASED_OUTPATIENT_CLINIC_OR_DEPARTMENT_OTHER): Payer: Medicare Other | Admitting: Oncology

## 2018-07-25 ENCOUNTER — Inpatient Hospital Stay: Payer: Medicare Other

## 2018-07-25 ENCOUNTER — Encounter: Payer: Self-pay | Admitting: Oncology

## 2018-07-25 VITALS — BP 106/64 | HR 71 | Temp 98.1°F | Resp 18 | Ht 61.02 in | Wt 124.7 lb

## 2018-07-25 DIAGNOSIS — I10 Essential (primary) hypertension: Secondary | ICD-10-CM | POA: Diagnosis not present

## 2018-07-25 DIAGNOSIS — Z5111 Encounter for antineoplastic chemotherapy: Secondary | ICD-10-CM

## 2018-07-25 DIAGNOSIS — C9200 Acute myeloblastic leukemia, not having achieved remission: Secondary | ICD-10-CM

## 2018-07-25 DIAGNOSIS — Z9012 Acquired absence of left breast and nipple: Secondary | ICD-10-CM

## 2018-07-25 DIAGNOSIS — D649 Anemia, unspecified: Secondary | ICD-10-CM | POA: Diagnosis not present

## 2018-07-25 DIAGNOSIS — Z853 Personal history of malignant neoplasm of breast: Secondary | ICD-10-CM | POA: Diagnosis not present

## 2018-07-25 LAB — CBC WITH DIFFERENTIAL/PLATELET
BASOS ABS: 0 10*3/uL (ref 0.0–0.1)
Basophils Relative: 1 %
EOS ABS: 0.1 10*3/uL (ref 0.0–0.5)
Eosinophils Relative: 2 %
HCT: 29.2 % — ABNORMAL LOW (ref 36.0–46.0)
Hemoglobin: 9.4 g/dL — ABNORMAL LOW (ref 12.0–15.0)
Lymphocytes Relative: 42 %
Lymphs Abs: 2.5 10*3/uL (ref 0.7–4.0)
MCH: 27.9 pg (ref 26.0–34.0)
MCHC: 32.2 g/dL (ref 30.0–36.0)
MCV: 86.6 fL (ref 80.0–100.0)
Monocytes Absolute: 2.2 10*3/uL — ABNORMAL HIGH (ref 0.1–1.0)
Monocytes Relative: 37 %
NRBC: 0 % (ref 0.0–0.2)
Neutro Abs: 1.1 10*3/uL — ABNORMAL LOW (ref 1.7–7.7)
Neutrophils Relative %: 18 %
PLATELETS: 25 10*3/uL — AB (ref 150–400)
RBC: 3.37 MIL/uL — AB (ref 3.87–5.11)
RDW: 15.1 % (ref 11.5–15.5)
WBC: 5.9 10*3/uL (ref 4.0–10.5)

## 2018-07-25 LAB — SAMPLE TO BLOOD BANK

## 2018-07-25 NOTE — Progress Notes (Signed)
No new changes noted today 

## 2018-07-28 ENCOUNTER — Telehealth: Payer: Self-pay | Admitting: *Deleted

## 2018-07-28 NOTE — Telephone Encounter (Signed)
Called daughter and left message about appt for her mother tom at 2 pm. I tried calling patient but line was busy x 2.I left my cell phone in case she has qeustions.  I tried again to patient's house and her husband answered the  Phone and said he would give wife the information for tom at 2 pm for her shot. sheis out of the house right now but he thinks she already knew about it from their daughter today

## 2018-07-28 NOTE — Progress Notes (Signed)
Hematology/Oncology Consult note [facility]  Telephone:([phone] [phone]  Patient Care Team: Jerrol Banana., MD as PCP - General (Family Medicine) Bary Castilla, Forest Gleason, MD as Consulting Physician (General Surgery) Idelle Leech, [place] as Consulting Physician (Optometry) Sindy Guadeloupe, MD as Medical Oncologist (Medical Oncology)   Name of the patient: Crystal Carpenter  [id number]  Sep 24, 1924   Date of visit: 07/28/18  Diagnosis- refractory AMl  Chief complaint/ Reason for visit- routine f/u of AML  Heme/Onc history: patient is a 84 year old female with a past medical history significant for breast cancer, hypercholesterolemia and hypertension among other medical problems. She has been referred to [place] for anemia. Recent CBC from 09/20/2017 showed white count of 5.6, H&H of 9.6/29.8 and a platelet count of 105. TSH was normal at 1.11. CMP was within normal limits. Serum iron was normal at 103. B12 levels were elevated at 1905 and folate was normal at greater than 24. Stool FOBT was negative.  Patient lives with her 81 year old husband and is independent of her ADLs and IADLs. She is still driving. She does feel fatigued on and off but today she reports feeling well. She denies any blood in her stool or urine.  Results of blood work from 10/21/2017 were as follows: CBC showed white count of 10.6, H&H of 10.1/31.9 with a platelet count of 210. Iron studies, haptoglobin and reticulocyte count was normal. Multiple myeloma panel showed no monoclonal protein and IFE was normal  Patients hb dropped down to 6.4 in may 2019. She received 2 units of prbc transfusion. She also had repeat anemia work up which was unremarkable. She then had progressive leucocytosis with wbc in 20's and left shift. She underwent bone marrow biopsy on 04/03/18. Noted to have 19% myeloid blasts in the marrow and 20% blasts in peripheral blood consistent with acute myeloid  leukemia  NGS panel showed NF1 but no other mutations. She was seen by Dr. Janene Madeira at [facility]. She is curently on venetoclax. She is receiving Inglis vidaza 7 days a month 1st dose was on 04/23/18  Bone marrow biopsy on day 20 postvidazastill showed 19% blasts.Patient was continued on Vidaza and venetoclax and repeat bone marrow biopsy at 2 months showed less than 5% blasts.   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  Interval history-patient has mild chronic fatigue.  She denies any bleeding or bruising.  She is still getting around the house independently.  Denies any constipation  ECOG PS- 2 Pain scale- 0 Opioid associated constipation- no  Review of systems- Review of Systems  Constitutional: Positive for malaise/fatigue. Negative for chills, fever and weight loss.  HENT: Negative for congestion, ear discharge and nosebleeds.   Eyes: Negative for blurred vision.  Respiratory: Negative for cough, hemoptysis, sputum production, shortness of breath and wheezing.   Cardiovascular: Negative for chest pain, palpitations, orthopnea and claudication.  Gastrointestinal: Negative for abdominal pain, blood in stool, constipation, diarrhea, heartburn, melena, nausea and vomiting.  Genitourinary: Negative for dysuria, flank pain, frequency, hematuria and urgency.  Musculoskeletal: Negative for back pain, joint pain and myalgias.  Skin: Negative for rash.  Neurological: Negative for dizziness, tingling, focal weakness, seizures, weakness and headaches.  Endo/Heme/Allergies: Does not bruise/bleed easily.  Psychiatric/Behavioral: Negative for depression and suicidal ideas. The patient does not have insomnia.        Allergies  Allergen Reactions  . Tape Rash  .  Gabapentin     Headache, "felt weird in the head"  . Sulfa Antibiotics   . Allopurinol Rash  . Shellfish Allergy Rash     Past  Medical History:  Diagnosis Date  . Acute myeloid leukemia ([place])   . Arthritis   . Cancer [facility]) July 2014   T2,N2a, Modified radical mastectomy. 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 COPD (chronic obstructive pulmonary disease) ([place])   . Degenerative disc disease, lumbar   . Hypertension   . Malignant neoplasm of breast (female), unspecified site July 2014   Her case was presented at the [facility] tumor board in July 2014. Recommendations were for chest wall/peripheral lymphatic radiation if metastatic disease was not identified, anti-estrogen therapy only if additional metastatic disease was detected on a PET CT. No plans for adjuvant chemotherapy if metastatic disease was identified was recommended.  . Osteoporosis   . Sciatic pain 2015  . Spinal stenosis      Past Surgical History:  Procedure Laterality Date  . ABDOMINAL HYSTERECTOMY    . BREAST SURGERY Left 04-28-13   left mastectomy, SN biopsy  . EPIDURAL BLOCK INJECTION  2015   x3    Social History   Socioeconomic History  . Marital status: Married    Spouse name: Not on file  . Number of children: Not on file  . Years of education: Not on file  . Highest education level: Not on file  Occupational History  . Not on file  Social Needs  . Financial resource strain: Not on file  . Food insecurity:    Worry: Not on file    Inability: Not on file  . Transportation needs:    Medical: Not on file    Non-medical: Not on file  Tobacco Use  . Smoking status: Never Smoker  . Smokeless tobacco: Never Used  Substance and Sexual Activity  . Alcohol use: No  . Drug use: No  . Sexual activity: Not on file  Lifestyle  . Physical activity:    Days per week: Not on file    Minutes per session: Not on file  . Stress: Not on file  Relationships  . Social connections:    Talks on phone: Not on file    Gets together: Not on file    Attends religious service: Not on file    Active member of club or  organization: Not on file    Attends meetings of clubs or organizations: Not on file    Relationship status: Not on file  . Intimate partner violence:    Fear of current or ex partner: Not on file    Emotionally abused: Not on file    Physically abused: Not on file    Forced sexual activity: Not on file  Other Topics Concern  . Not on file  Social History Narrative  . Not on file    Family History  Problem Relation Age of Onset  . Stroke Mother   . Heart disease Father   . Stroke Father   . Parkinson's disease Sister   . Parkinson's disease Brother      Current Outpatient Medications:  .  alendronate (FOSAMAX) 70 MG tablet, TAKE 1 TABLET BY MOUTH EVERY 7 DAYS. TAKE WITH A FULL GLASS OF WATER ON AN EMPTY STOMACH., Disp: 12 tablet, Rfl: 3 .  b complex vitamins tablet, Take 1 tablet by mouth daily., Disp: , Rfl:  .  docusate sodium (COLACE) 100 MG capsule, Take 100  mg by mouth daily., Disp: , Rfl:  .  famotidine (PEPCID) 10 MG tablet, Take 10 mg by mouth daily as needed for heartburn or indigestion., Disp: , Rfl:  .  hydrochlorothiazide (HYDRODIURIL) 25 MG tablet, TAKE 1 TABLET BY MOUTH EVERY DAY, Disp: 90 tablet, Rfl: 3 .  naproxen sodium (ALEVE) 220 MG tablet, Take 220 mg by mouth daily as needed. , Disp: , Rfl:  .  potassium chloride (MICRO-K) 10 MEQ CR capsule, TAKE 1 CAPSULE BY MOUTH EVERY DAY., Disp: 90 capsule, Rfl: 3 .  azelastine (ASTELIN) 0.1 % nasal spray, Place into both nostrils 2 (two) times daily as needed. Use in each nostril as directed , Disp: , Rfl:  .  ciprofloxacin (CIPRO) 500 MG tablet, Take 1 tablet (500 mg total) by mouth 2 (two) times daily. (Patient not taking: Reported on 07/04/2018), Disp: 14 tablet, Rfl: 0 .  fluticasone (FLONASE) 50 MCG/ACT nasal spray, Place 1 spray into both nostrils daily as needed. , Disp: , Rfl:  .  LORazepam (ATIVAN) 0.5 MG tablet, Take 1 tablet (0.5 mg total) by mouth every 6 (six) hours as needed (Nausea or vomiting). (Patient not  taking: Reported on 05/22/2018), Disp: 30 tablet, Rfl: 0 .  magic mouthwash SOLN, Take 5 mLs by mouth 4 (four) times daily as needed for mouth pain. (Patient not taking: Reported on 06/06/2018), Disp: 240 mL, Rfl: 0 .  OLANZapine (ZYPREXA) 5 MG tablet, Take 1 tablet (5 mg total) by mouth at bedtime. (Patient not taking: Reported on 07/25/2018), Disp: 30 tablet, Rfl: 0 .  ondansetron (ZOFRAN) 8 MG tablet, Take 1 tablet (8 mg total) by mouth 2 (two) times daily as needed (Nausea or vomiting). (Patient not taking: Reported on 05/22/2018), Disp: 30 tablet, Rfl: 1 .  prochlorperazine (COMPAZINE) 10 MG tablet, Take 1 tablet (10 mg total) by mouth every 6 (six) hours as needed (Nausea or vomiting). (Patient not taking: Reported on 05/22/2018), Disp: 30 tablet, Rfl: 1 .  Saline 0.2 % SOLN, , Disp: , Rfl:  .  sertraline (ZOLOFT) 100 MG tablet, TAKE 1 TABLET BY MOUTH EVERY DAY (Patient not taking: Reported on 06/20/2018), Disp: 30 tablet, Rfl: 11 No current facility-administered medications for this visit.   Facility-Administered Medications Ordered in Other Visits:  .  heparin lock flush 100 unit/mL, 500 Units, Intracatheter, Daily PRN, Sindy Guadeloupe, MD .  sodium chloride flush (NS) 0.9 % injection 10 mL, 10 mL, Intracatheter, PRN, Sindy Guadeloupe, MD .  sodium chloride flush (NS) 0.9 % injection 3 mL, 3 mL, Intracatheter, PRN, Sindy Guadeloupe, MD  Physical exam:  Vitals:   07/25/18 0855  BP: 106/64  Pulse: 71  Resp: 18  Temp: 98.1 F (36.7 C)  SpO2: 99%  Weight: 124 lb 11.2 oz (56.6 kg)  Height: 5' 1.02" (1.55 m)   Physical Exam  Constitutional: She is oriented to person, place, and time.  Thin frail elderly woman sitting in a wheelchair.  She appears in no acute distress  HENT:  Head: Normocephalic and atraumatic.  Eyes: Pupils are equal, round, and reactive to light. EOM are normal.  Neck: Normal range of motion.  Cardiovascular: Normal rate, regular rhythm and normal heart sounds.    Pulmonary/Chest: Effort normal and breath sounds normal.  Abdominal: Soft. Bowel sounds are normal.  Neurological: She is alert and oriented to person, place, and time.  Skin: Skin is warm and dry.     CMP Latest Ref Rng & Units 07/11/2018  Glucose 70 -  99 mg/dL 109(H)  BUN 8 - 23 mg/dL 23  Creatinine 0.44 - 1.00 mg/dL 0.60  Sodium 135 - 145 mmol/L 138  Potassium 3.5 - 5.1 mmol/L 3.7  Chloride 98 - 111 mmol/L 105  CO2 22 - 32 mmol/L 26  Calcium 8.9 - 10.3 mg/dL 9.5  Total Protein 6.5 - 8.1 g/dL 6.2(L)  Total Bilirubin 0.3 - 1.2 mg/dL 0.9  Alkaline Phos 38 - 126 U/L 84  AST 15 - 41 U/L 15  ALT 0 - 44 U/L 13   CBC Latest Ref Rng & Units 07/25/2018  WBC 4.0 - 10.5 K/uL 5.9  Hemoglobin 12.0 - 15.0 g/dL 9.4(L)  Hematocrit 36.0 - 46.0 % 29.2(L)  Platelets 150 - 400 K/uL 25(LL)     Assessment and plan- Patient is a 84 y.o. female status post 2 cycles of Vidaza and venetoclax.  Repeat bone marrow biopsy shows relapsed leukemia with 53% blasts and hypercellular marrow.  She is here to discuss further treatment options  I have reviewed recommendations by Dr. Janene Madeira.  I again discussed all the possible options with her and her son-in-law as well.  Her daughter Manuela Schwartz heard the conversation over the phone.  Her options currently are as follows:  1.  Not pursue further aggressive management and proceed with best supportive care which would mean periodic transfusions and eventually hospice  2.  Continue Vidaza without the venetoclax.  Combination treatment did cause significant cytopenia and there was still evidence of relapse leukemia at the end of 2 months of venetoclax.  I discussed her case with Dr. Janene Madeira after the patient left.  Dr. Janene Madeira was of the opinion that given that repeat bone marrow biopsy showed a hypercellular marrow, it would make sense to proceed with Vidaza right away instead of waiting any further to allow for count recovery.  Her anemia and thrombocytopenia presently is  likely secondary to blasts in her bone marrow rather than hypocellular marrow  3.  Switch to gemtuzumab.  Gemtuzumab is associated with worsening side effects such as nausea fatigue, cytopenias and diarrhea.  I will also need to touch base with pharmacy to see if gemtuzumab can be given at [facility] or if she will need to go to [facility] for this.  4.  Consider enrollment in clinical trial at [facility] if she would be a candidate.  This would mean that her majority of her treatment would be at [facility] and would involve frequent trips there.  Patient has thought about all her options and only wishes to pursue with [place] at this time.  She gets significantly fatigued when she goes to [facility] for her appointments and therefore does not wish to get enrolled in clinical trial.  She also does not wish to switch to gemtuzumab at this time given the risk of side effects as well as overall survival data of less than 5 months.  She is also however not willing to proceed with best supportive care or hospice at this time and would like to proceed with single agent Vidaza  She will therefore proceed with cycle 3-day 1 of 5 days on 07/29/2018.  I will see her in the middle of her cycle on 07/31/2018.  Dr. Janene Madeira would tentatively like to see her in 3 months time and consider repeating a bone marrow biopsy.  If she were to begin remission then perhaps giving her a break from 5 days a could be considered in the future.  I will discuss this in further detail at her  next visit.  I did discuss Dr. Merri Ray recommendations with patient's daughter over the phone as well today as I was unable to reach patient over the phone    Total face to face encounter time for this patient visit was 45 min. >50% of the time was  spent in counseling and coordination of care.                                                                                                                                                                         Visit  Diagnosis 1. Symptomatic anemia   2. Acute myeloid leukemia not having achieved remission ([place])   3. Encounter for antineoplastic chemotherapy      Dr. Randa Evens, MD, MPH [facility] at [facility] [phone] 07/28/2018  12:58 PM

## 2018-07-29 ENCOUNTER — Inpatient Hospital Stay: Payer: Medicare Other

## 2018-07-29 VITALS — BP 113/78 | HR 73 | Temp 98.5°F | Resp 18 | Wt 124.0 lb

## 2018-07-29 DIAGNOSIS — C9200 Acute myeloblastic leukemia, not having achieved remission: Secondary | ICD-10-CM

## 2018-07-29 MED ORDER — AZACITIDINE CHEMO SQ INJECTION
75.0000 mg/m2 | Freq: Once | INTRAMUSCULAR | Status: AC
  Administered 2018-07-29: 122.5 mg via SUBCUTANEOUS
  Filled 2018-07-29: qty 4.9

## 2018-07-29 MED ORDER — ONDANSETRON HCL 4 MG PO TABS
8.0000 mg | ORAL_TABLET | Freq: Once | ORAL | Status: AC
  Administered 2018-07-29: 8 mg via ORAL
  Filled 2018-07-29: qty 2

## 2018-07-29 NOTE — Progress Notes (Signed)
Per Dr. Janese Banks, ok to proceed with Vidaza despite PLT of 25.

## 2018-07-30 ENCOUNTER — Inpatient Hospital Stay: Payer: Medicare Other

## 2018-07-30 VITALS — BP 114/65 | HR 69 | Temp 97.6°F | Resp 18

## 2018-07-30 DIAGNOSIS — C9200 Acute myeloblastic leukemia, not having achieved remission: Secondary | ICD-10-CM

## 2018-07-30 MED ORDER — ONDANSETRON HCL 4 MG PO TABS
8.0000 mg | ORAL_TABLET | Freq: Once | ORAL | Status: AC
  Administered 2018-07-30: 8 mg via ORAL
  Filled 2018-07-30: qty 2

## 2018-07-30 MED ORDER — AZACITIDINE CHEMO SQ INJECTION
75.0000 mg/m2 | Freq: Once | INTRAMUSCULAR | Status: AC
  Administered 2018-07-30: 122.5 mg via SUBCUTANEOUS
  Filled 2018-07-30: qty 4.9

## 2018-07-31 ENCOUNTER — Other Ambulatory Visit: Payer: Self-pay

## 2018-07-31 ENCOUNTER — Other Ambulatory Visit: Payer: Self-pay | Admitting: *Deleted

## 2018-07-31 ENCOUNTER — Inpatient Hospital Stay (HOSPITAL_BASED_OUTPATIENT_CLINIC_OR_DEPARTMENT_OTHER): Payer: Medicare Other | Admitting: Oncology

## 2018-07-31 ENCOUNTER — Encounter: Payer: Self-pay | Admitting: Oncology

## 2018-07-31 ENCOUNTER — Inpatient Hospital Stay: Payer: Medicare Other

## 2018-07-31 VITALS — BP 128/74 | HR 72 | Temp 98.9°F | Resp 18 | Ht 61.02 in | Wt 127.5 lb

## 2018-07-31 DIAGNOSIS — I10 Essential (primary) hypertension: Secondary | ICD-10-CM | POA: Diagnosis not present

## 2018-07-31 DIAGNOSIS — Z9012 Acquired absence of left breast and nipple: Secondary | ICD-10-CM | POA: Diagnosis not present

## 2018-07-31 DIAGNOSIS — R519 Headache, unspecified: Secondary | ICD-10-CM

## 2018-07-31 DIAGNOSIS — R51 Headache: Secondary | ICD-10-CM

## 2018-07-31 DIAGNOSIS — D649 Anemia, unspecified: Secondary | ICD-10-CM | POA: Diagnosis not present

## 2018-07-31 DIAGNOSIS — Z853 Personal history of malignant neoplasm of breast: Secondary | ICD-10-CM

## 2018-07-31 DIAGNOSIS — C9200 Acute myeloblastic leukemia, not having achieved remission: Secondary | ICD-10-CM

## 2018-07-31 DIAGNOSIS — Z5111 Encounter for antineoplastic chemotherapy: Secondary | ICD-10-CM

## 2018-07-31 LAB — COMPREHENSIVE METABOLIC PANEL
ALT: 19 U/L (ref 0–44)
ANION GAP: 6 (ref 5–15)
AST: 16 U/L (ref 15–41)
Albumin: 3.5 g/dL (ref 3.5–5.0)
Alkaline Phosphatase: 77 U/L (ref 38–126)
BUN: 16 mg/dL (ref 8–23)
CHLORIDE: 99 mmol/L (ref 98–111)
CO2: 27 mmol/L (ref 22–32)
Calcium: 9.7 mg/dL (ref 8.9–10.3)
Creatinine, Ser: 0.59 mg/dL (ref 0.44–1.00)
GFR calc Af Amer: >60 mL/min (ref >60)
Glucose, Bld: 121 mg/dL — ABNORMAL HIGH (ref 70–99)
POTASSIUM: 3.6 mmol/L (ref 3.5–5.1)
Sodium: 132 mmol/L — ABNORMAL LOW (ref 135–145)
TOTAL PROTEIN: 5.9 g/dL — AB (ref 6.5–8.1)
Total Bilirubin: 0.6 mg/dL (ref 0.3–1.2)

## 2018-07-31 LAB — CBC WITH DIFFERENTIAL/PLATELET
ABS IMMATURE GRANULOCYTES: 0.04 10*3/uL (ref 0.00–0.07)
BASOS ABS: 0 10*3/uL (ref 0.0–0.1)
BASOS PCT: 0 %
Eosinophils Absolute: 0.1 10*3/uL (ref 0.0–0.5)
Eosinophils Relative: 2 %
HCT: 23.6 % — ABNORMAL LOW (ref 36.0–46.0)
HEMOGLOBIN: 7.7 g/dL — AB (ref 12.0–15.0)
IMMATURE GRANULOCYTES: 1 %
Lymphocytes Relative: 42 %
Lymphs Abs: 2.9 10*3/uL (ref 0.7–4.0)
MCH: 28 pg (ref 26.0–34.0)
MCHC: 32.6 g/dL (ref 30.0–36.0)
MCV: 85.8 fL (ref 80.0–100.0)
Monocytes Absolute: 2.7 10*3/uL — ABNORMAL HIGH (ref 0.1–1.0)
Monocytes Relative: 40 %
NEUTROS ABS: 1 10*3/uL — AB (ref 1.7–7.7)
NEUTROS PCT: 15 %
NRBC: 0 % (ref 0.0–0.2)
PLATELETS: 34 10*3/uL — AB (ref 150–400)
RBC: 2.75 MIL/uL — AB (ref 3.87–5.11)
RDW: 15 % (ref 11.5–15.5)
WBC: 6.7 10*3/uL (ref 4.0–10.5)

## 2018-07-31 LAB — SAMPLE TO BLOOD BANK

## 2018-07-31 LAB — PREPARE RBC (CROSSMATCH)

## 2018-07-31 MED ORDER — ACETAMINOPHEN 325 MG PO TABS
650.0000 mg | ORAL_TABLET | Freq: Once | ORAL | Status: AC
  Administered 2018-07-31: 650 mg via ORAL
  Filled 2018-07-31: qty 2

## 2018-07-31 MED ORDER — AZACITIDINE CHEMO SQ INJECTION
75.0000 mg/m2 | Freq: Once | INTRAMUSCULAR | Status: AC
  Administered 2018-07-31: 122.5 mg via SUBCUTANEOUS
  Filled 2018-07-31: qty 4.9

## 2018-07-31 MED ORDER — ONDANSETRON HCL 4 MG PO TABS
8.0000 mg | ORAL_TABLET | Freq: Once | ORAL | Status: AC
  Administered 2018-07-31: 8 mg via ORAL
  Filled 2018-07-31: qty 2

## 2018-07-31 NOTE — Progress Notes (Signed)
Per Judeen Hammans RN per Dr. Janese Banks okay to proceed with treatment with Hemoglobin 7.7, Platelets 34, and ANC 1.0.

## 2018-07-31 NOTE — Addendum Note (Signed)
Addended by: Lorrine Kin A on: 07/31/2018 04:23 PM   Modules accepted: Orders

## 2018-08-01 ENCOUNTER — Inpatient Hospital Stay: Payer: Medicare Other

## 2018-08-01 ENCOUNTER — Encounter: Payer: Self-pay | Admitting: Oncology

## 2018-08-01 VITALS — BP 118/72 | HR 59 | Temp 97.7°F | Resp 19

## 2018-08-01 DIAGNOSIS — C9200 Acute myeloblastic leukemia, not having achieved remission: Secondary | ICD-10-CM

## 2018-08-01 DIAGNOSIS — D696 Thrombocytopenia, unspecified: Secondary | ICD-10-CM

## 2018-08-01 DIAGNOSIS — D649 Anemia, unspecified: Secondary | ICD-10-CM

## 2018-08-01 LAB — PREPARE RBC (CROSSMATCH)

## 2018-08-01 MED ORDER — ACETAMINOPHEN 325 MG PO TABS
650.0000 mg | ORAL_TABLET | Freq: Once | ORAL | Status: AC
  Administered 2018-08-01: 650 mg via ORAL
  Filled 2018-08-01: qty 2

## 2018-08-01 MED ORDER — ONDANSETRON HCL 4 MG PO TABS
8.0000 mg | ORAL_TABLET | Freq: Once | ORAL | Status: AC
  Administered 2018-08-01: 8 mg via ORAL
  Filled 2018-08-01: qty 2

## 2018-08-01 MED ORDER — SODIUM CHLORIDE 0.9% IV SOLUTION
250.0000 mL | Freq: Once | INTRAVENOUS | Status: AC
  Administered 2018-08-01: 250 mL via INTRAVENOUS
  Filled 2018-08-01: qty 250

## 2018-08-01 MED ORDER — AZACITIDINE CHEMO SQ INJECTION
75.0000 mg/m2 | Freq: Once | INTRAMUSCULAR | Status: AC
  Administered 2018-08-01: 122.5 mg via SUBCUTANEOUS
  Filled 2018-08-01: qty 4.9

## 2018-08-02 LAB — BPAM RBC
BLOOD PRODUCT EXPIRATION DATE: 201910232359
BLOOD PRODUCT EXPIRATION DATE: 201911082359
Blood Product Expiration Date: 201911052359
ISSUE DATE / TIME: 201910180918
UNIT TYPE AND RH: 5100
Unit Type and Rh: 6200
Unit Type and Rh: 6200

## 2018-08-02 LAB — TYPE AND SCREEN
ABO/RH(D): A POS
ANTIBODY SCREEN: NEGATIVE
UNIT DIVISION: 0
Unit division: 0
Unit division: 0

## 2018-08-03 NOTE — Progress Notes (Signed)
Hematology/Oncology Consult note [facility]  Telephone:([phone] [phone]  Patient Care Team: Jerrol Banana., MD as PCP - General (Family Medicine) Bary Castilla, Forest Gleason, MD as Consulting Physician (General Surgery) Idelle Leech, [place] as Consulting Physician (Optometry) Sindy Guadeloupe, MD as Medical Oncologist (Medical Oncology)   Name of the patient: Crystal Carpenter  [id number]  [birth year]   Date of visit: 08/03/18  Diagnosis- refractory AML   Chief complaint/ Reason for visit-on treatment assessment prior to cycle 3-day 3 of Vidaza  Heme/Onc history: patient is a 84 year old female with a past medical history significant for breast cancer, hypercholesterolemia and hypertension among other medical problems. She has been referred to [place] for anemia. Recent CBC from 09/20/2017 showed white count of 5.6, H&H of 9.6/29.8 and a platelet count of 105. TSH was normal at 1.11. CMP was within normal limits. Serum iron was normal at 103. B12 levels were elevated at 1905 and folate was normal at greater than 24. Stool FOBT was negative.  Patient lives with her 36 year old husband and is independent of her ADLs and IADLs. She is still driving. She does feel fatigued on and off but today she reports feeling well. She denies any blood in her stool or urine.  Results of blood work from 10/21/2017 were as follows: CBC showed white count of 10.6, H&H of 10.1/31.9 with a platelet count of 210. Iron studies, haptoglobin and reticulocyte count was normal. Multiple myeloma panel showed no monoclonal protein and IFE was normal  Patients hb dropped down to 6.4 in may 2019. She received 2 units of prbc transfusion. She also had repeat anemia work up which was unremarkable. She then had progressive leucocytosis with wbc in 20's and left shift. She underwent bone marrow biopsy on 04/03/18. Noted to have 19% myeloid blasts in the marrow and 20% blasts in  peripheral blood consistent with acute myeloid leukemia  NGS panel showed NF1 but no other mutations. She was seen by Dr. Janene Madeira at [facility]. She is curently on venetoclax. She is receiving [place] vidaza 7 days a month 1st dose was on 04/23/18  Bone marrow biopsy on day 20 postvidazastill showed 19% blasts.Patient was continued on Vidaza and venetoclax and repeat bone marrow biopsy at 2 months showed less than 5% blasts.   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  Multiple second line options have been discussed with the patient and she is chosen to proceed with single agent Vidaza for palliation  Interval history-she is doing well for her age.  Denies any bleeding or bruising denies any falls.  Her appetite is fair.  Denies any constipation or diarrhea  ECOG PS- 2 Pain scale- 0  Review of systems- Review of Systems  Constitutional: Positive for malaise/fatigue. Negative for chills, fever and weight loss.  HENT: Negative for congestion, ear discharge and nosebleeds.   Eyes: Negative for blurred vision.  Respiratory: Negative for cough, hemoptysis, sputum production, shortness of breath and wheezing.   Cardiovascular: Negative for chest pain, palpitations, orthopnea and claudication.  Gastrointestinal: Negative for abdominal pain, blood in stool, constipation, diarrhea, heartburn, melena, nausea and vomiting.  Genitourinary: Negative for dysuria, flank pain, frequency, hematuria and urgency.  Musculoskeletal: Negative for back pain, joint pain and myalgias.  Skin: Negative for rash.  Neurological: Negative for dizziness, tingling, focal weakness, seizures, weakness and headaches.  Endo/Heme/Allergies: Does not bruise/bleed easily.  Psychiatric/Behavioral:  Negative for depression and suicidal ideas. The patient does not have insomnia.       Allergies  Allergen Reactions  . Tape Rash    . Gabapentin     Headache, "felt weird in the head"  . Sulfa Antibiotics   . Allopurinol Rash  . Shellfish Allergy Rash     Past Medical History:  Diagnosis Date  . Acute myeloid leukemia ([place])   . Arthritis   . Cancer [facility]) July 2014   T2,N2a, Modified radical mastectomy. 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 COPD (chronic obstructive pulmonary disease) ([place])   . Degenerative disc disease, lumbar   . Hypertension   . Malignant neoplasm of breast (female), unspecified site July 2014   Her case was presented at the [facility] tumor board in July 2014. Recommendations were for chest wall/peripheral lymphatic radiation if metastatic disease was not identified, anti-estrogen therapy only if additional metastatic disease was detected on a PET CT. No plans for adjuvant chemotherapy if metastatic disease was identified was recommended.  . Osteoporosis   . Sciatic pain 2015  . Spinal stenosis      Past Surgical History:  Procedure Laterality Date  . ABDOMINAL HYSTERECTOMY    . BREAST SURGERY Left 04-28-13   left mastectomy, SN biopsy  . EPIDURAL BLOCK INJECTION  2015   x3    Social History   Socioeconomic History  . Marital status: Married    Spouse name: Not on file  . Number of children: Not on file  . Years of education: Not on file  . Highest education level: Not on file  Occupational History  . Not on file  Social Needs  . Financial resource strain: Not on file  . Food insecurity:    Worry: Not on file    Inability: Not on file  . Transportation needs:    Medical: Not on file    Non-medical: Not on file  Tobacco Use  . Smoking status: Never Smoker  . Smokeless tobacco: Never Used  Substance and Sexual Activity  . Alcohol use: No  . Drug use: No  . Sexual activity: Not on file  Lifestyle  . Physical activity:    Days per week: Not on file    Minutes per session: Not on file  . Stress: Not on file  Relationships  . Social connections:     Talks on phone: Not on file    Gets together: Not on file    Attends religious service: Not on file    Active member of club or organization: Not on file    Attends meetings of clubs or organizations: Not on file    Relationship status: Not on file  . Intimate partner violence:    Fear of current or ex partner: Not on file    Emotionally abused: Not on file    Physically abused: Not on file    Forced sexual activity: Not on file  Other Topics Concern  . Not on file  Social History Narrative  . Not on file    Family History  Problem Relation Age of Onset  . Stroke Mother   . Heart disease Father   . Stroke Father   . Parkinson's disease Sister   . Parkinson's disease Brother      Current Outpatient Medications:  .  alendronate (FOSAMAX) 70 MG tablet, TAKE 1 TABLET BY MOUTH EVERY 7 DAYS. TAKE WITH A FULL GLASS OF WATER ON AN EMPTY STOMACH., Disp: 39  tablet, Rfl: 3 .  b complex vitamins tablet, Take 1 tablet by mouth daily., Disp: , Rfl:  .  docusate sodium (COLACE) 100 MG capsule, Take 100 mg by mouth daily., Disp: , Rfl:  .  hydrochlorothiazide (HYDRODIURIL) 25 MG tablet, TAKE 1 TABLET BY MOUTH EVERY DAY, Disp: 90 tablet, Rfl: 3 .  potassium chloride (MICRO-K) 10 MEQ CR capsule, TAKE 1 CAPSULE BY MOUTH EVERY DAY., Disp: 90 capsule, Rfl: 3 .  Saline 0.2 % SOLN, , Disp: , Rfl:  .  azelastine (ASTELIN) 0.1 % nasal spray, Place into both nostrils 2 (two) times daily as needed. Use in each nostril as directed , Disp: , Rfl:  .  ciprofloxacin (CIPRO) 500 MG tablet, Take 1 tablet (500 mg total) by mouth 2 (two) times daily. (Patient not taking: Reported on 07/04/2018), Disp: 14 tablet, Rfl: 0 .  famotidine (PEPCID) 10 MG tablet, Take 10 mg by mouth daily as needed for heartburn or indigestion., Disp: , Rfl:  .  fluticasone (FLONASE) 50 MCG/ACT nasal spray, Place 1 spray into both nostrils daily as needed. , Disp: , Rfl:  .  LORazepam (ATIVAN) 0.5 MG tablet, Take 1 tablet (0.5 mg total)  by mouth every 6 (six) hours as needed (Nausea or vomiting). (Patient not taking: Reported on 05/22/2018), Disp: 30 tablet, Rfl: 0 .  magic mouthwash SOLN, Take 5 mLs by mouth 4 (four) times daily as needed for mouth pain. (Patient not taking: Reported on 06/06/2018), Disp: 240 mL, Rfl: 0 .  naproxen sodium (ALEVE) 220 MG tablet, Take 220 mg by mouth daily as needed. , Disp: , Rfl:  .  OLANZapine (ZYPREXA) 5 MG tablet, Take 1 tablet (5 mg total) by mouth at bedtime. (Patient not taking: Reported on 07/25/2018), Disp: 30 tablet, Rfl: 0 .  ondansetron (ZOFRAN) 8 MG tablet, Take 1 tablet (8 mg total) by mouth 2 (two) times daily as needed (Nausea or vomiting). (Patient not taking: Reported on 05/22/2018), Disp: 30 tablet, Rfl: 1 .  prochlorperazine (COMPAZINE) 10 MG tablet, Take 1 tablet (10 mg total) by mouth every 6 (six) hours as needed (Nausea or vomiting). (Patient not taking: Reported on 05/22/2018), Disp: 30 tablet, Rfl: 1 .  sertraline (ZOLOFT) 100 MG tablet, TAKE 1 TABLET BY MOUTH EVERY DAY (Patient not taking: Reported on 06/20/2018), Disp: 30 tablet, Rfl: 11 No current facility-administered medications for this visit.   Facility-Administered Medications Ordered in Other Visits:  .  heparin lock flush 100 unit/mL, 500 Units, Intracatheter, Daily PRN, Sindy Guadeloupe, MD .  sodium chloride flush (NS) 0.9 % injection 10 mL, 10 mL, Intracatheter, PRN, Sindy Guadeloupe, MD .  sodium chloride flush (NS) 0.9 % injection 3 mL, 3 mL, Intracatheter, PRN, Sindy Guadeloupe, MD  Physical exam:  Vitals:   07/31/18 1411  BP: 128/74  Pulse: 72  Resp: 18  Temp: 98.9 F (37.2 C)  TempSrc: Tympanic  Weight: 127 lb 8 oz (57.8 kg)  Height: 5' 1.02" (1.55 m)   Physical Exam  Constitutional: She is oriented to person, place, and time.  Thin frail elderly lady in no acute distress  HENT:  Head: Normocephalic and atraumatic.  Eyes: Pupils are equal, round, and reactive to light. EOM are normal.  Neck: Normal range  of motion.  Cardiovascular: Normal rate, regular rhythm and normal heart sounds.  Pulmonary/Chest: Effort normal and breath sounds normal.  Abdominal: Soft. Bowel sounds are normal.  Musculoskeletal: She exhibits edema (Trace bilateral).  Neurological: She is alert  and oriented to person, place, and time.  Skin: Skin is warm and dry.     CMP Latest Ref Rng & Units 07/31/2018  Glucose 70 - 99 mg/dL 121(H)  BUN 8 - 23 mg/dL 16  Creatinine 0.44 - 1.00 mg/dL 0.59  Sodium 135 - 145 mmol/L 132(L)  Potassium 3.5 - 5.1 mmol/L 3.6  Chloride 98 - 111 mmol/L 99  CO2 22 - 32 mmol/L 27  Calcium 8.9 - 10.3 mg/dL 9.7  Total Protein 6.5 - 8.1 g/dL 5.9(L)  Total Bilirubin 0.3 - 1.2 mg/dL 0.6  Alkaline Phos 38 - 126 U/L 77  AST 15 - 41 U/L 16  ALT 0 - 44 U/L 19   CBC Latest Ref Rng & Units 07/31/2018  WBC 4.0 - 10.5 K/uL 6.7  Hemoglobin 12.0 - 15.0 g/dL 7.7(L)  Hematocrit 36.0 - 46.0 % 23.6(L)  Platelets 150 - 400 K/uL 34(L)    Assessment and plan- Patient is a 84 y.o. female with the AML here for on treatment assessment prior to cycle 3-day 3 of Vidaza  Counts okay to proceed with cycle 3-day 3 of Vidaza today and for the next 4 days.  She has not required platelet transfusion for over a week now.  Her hemoglobin is 7.7 today and since we are heading to once a weekend I will transfuse her 1 more unit of PRBC today.  We will continue checking her CBC with differential once a week for possible transfusion.  If her [place] is persistently less than 0.5 we will consider starting her on ciprofloxacin prophylaxis.  She could not tolerate Levaquin in the past.  I will see her back in 2 weeks time for routine follow-up.  Plan at this time is to continue vidaza 7 days every month.  Discuss possibility of getting a bone marrow biopsy in 3 months time at [facility] by Dr. Janene Madeira to assess response as well as to see if she could possibly get a break from while he is should she be in temporary remission   Visit  Diagnosis 1. Symptomatic anemia   2. Acute myeloid leukemia not having achieved remission ([place])   3. Encounter for antineoplastic chemotherapy      Dr. Randa Evens, MD, MPH [facility] at [facility] [phone] 08/03/2018 10:04 AM

## 2018-08-04 ENCOUNTER — Inpatient Hospital Stay: Payer: Medicare Other

## 2018-08-04 VITALS — BP 125/59 | HR 76 | Temp 97.4°F | Resp 20

## 2018-08-04 DIAGNOSIS — C9200 Acute myeloblastic leukemia, not having achieved remission: Secondary | ICD-10-CM

## 2018-08-04 LAB — CBC WITH DIFFERENTIAL/PLATELET
ABS IMMATURE GRANULOCYTES: 0.04 10*3/uL (ref 0.00–0.07)
BASOS ABS: 0 10*3/uL (ref 0.0–0.1)
Basophils Relative: 0 %
Eosinophils Absolute: 0.1 10*3/uL (ref 0.0–0.5)
Eosinophils Relative: 1 %
HCT: 24.7 % — ABNORMAL LOW (ref 36.0–46.0)
Hemoglobin: 8.3 g/dL — ABNORMAL LOW (ref 12.0–15.0)
IMMATURE GRANULOCYTES: 1 %
LYMPHS PCT: 42 %
Lymphs Abs: 2.7 10*3/uL (ref 0.7–4.0)
MCH: 28 pg (ref 26.0–34.0)
MCHC: 33.6 g/dL (ref 30.0–36.0)
MCV: 83.4 fL (ref 80.0–100.0)
MONO ABS: 2.3 10*3/uL — AB (ref 0.1–1.0)
Monocytes Relative: 36 %
NEUTROS ABS: 1.3 10*3/uL — AB (ref 1.7–7.7)
NRBC: 0 % (ref 0.0–0.2)
Neutrophils Relative %: 20 %
PLATELETS: 30 10*3/uL — AB (ref 150–400)
RBC: 2.96 MIL/uL — AB (ref 3.87–5.11)
RDW: 15.4 % (ref 11.5–15.5)
WBC: 6.4 10*3/uL (ref 4.0–10.5)

## 2018-08-04 LAB — SAMPLE TO BLOOD BANK

## 2018-08-04 MED ORDER — ONDANSETRON HCL 4 MG PO TABS
8.0000 mg | ORAL_TABLET | Freq: Once | ORAL | Status: AC
  Administered 2018-08-04: 8 mg via ORAL
  Filled 2018-08-04: qty 2

## 2018-08-04 MED ORDER — AZACITIDINE CHEMO SQ INJECTION
75.0000 mg/m2 | Freq: Once | INTRAMUSCULAR | Status: AC
  Administered 2018-08-04: 122.5 mg via SUBCUTANEOUS
  Filled 2018-08-04: qty 4.9

## 2018-08-05 ENCOUNTER — Inpatient Hospital Stay: Payer: Medicare Other

## 2018-08-05 VITALS — BP 109/62 | HR 74 | Temp 97.0°F | Resp 20

## 2018-08-05 DIAGNOSIS — C9200 Acute myeloblastic leukemia, not having achieved remission: Secondary | ICD-10-CM

## 2018-08-05 MED ORDER — ONDANSETRON HCL 4 MG PO TABS
8.0000 mg | ORAL_TABLET | Freq: Once | ORAL | Status: AC
  Administered 2018-08-05: 8 mg via ORAL
  Filled 2018-08-05: qty 2

## 2018-08-05 MED ORDER — AZACITIDINE CHEMO SQ INJECTION
75.0000 mg/m2 | Freq: Once | INTRAMUSCULAR | Status: AC
  Administered 2018-08-05: 122.5 mg via SUBCUTANEOUS
  Filled 2018-08-05: qty 4.9

## 2018-08-06 ENCOUNTER — Inpatient Hospital Stay: Payer: Medicare Other

## 2018-08-06 ENCOUNTER — Other Ambulatory Visit: Payer: Self-pay

## 2018-08-06 VITALS — BP 126/72 | HR 70 | Temp 98.9°F | Resp 18

## 2018-08-06 DIAGNOSIS — C9200 Acute myeloblastic leukemia, not having achieved remission: Secondary | ICD-10-CM

## 2018-08-06 LAB — CBC WITH DIFFERENTIAL/PLATELET
Abs Immature Granulocytes: 0.03 10*3/uL (ref 0.00–0.07)
BASOS PCT: 0 %
Basophils Absolute: 0 10*3/uL (ref 0.0–0.1)
EOS PCT: 1 %
Eosinophils Absolute: 0.1 10*3/uL (ref 0.0–0.5)
HCT: 25.1 % — ABNORMAL LOW (ref 36.0–46.0)
Hemoglobin: 8.2 g/dL — ABNORMAL LOW (ref 12.0–15.0)
Immature Granulocytes: 1 %
Lymphocytes Relative: 43 %
Lymphs Abs: 2.5 10*3/uL (ref 0.7–4.0)
MCH: 28.1 pg (ref 26.0–34.0)
MCHC: 32.7 g/dL (ref 30.0–36.0)
MCV: 86 fL (ref 80.0–100.0)
MONO ABS: 1.9 10*3/uL — AB (ref 0.1–1.0)
Monocytes Relative: 33 %
Neutro Abs: 1.2 10*3/uL — ABNORMAL LOW (ref 1.7–7.7)
Neutrophils Relative %: 22 %
PLATELETS: 30 10*3/uL — AB (ref 150–400)
RBC: 2.92 MIL/uL — ABNORMAL LOW (ref 3.87–5.11)
RDW: 15.3 % (ref 11.5–15.5)
WBC: 5.6 10*3/uL (ref 4.0–10.5)
nRBC: 0 % (ref 0.0–0.2)

## 2018-08-06 LAB — SAMPLE TO BLOOD BANK

## 2018-08-06 MED ORDER — ONDANSETRON HCL 4 MG PO TABS
8.0000 mg | ORAL_TABLET | Freq: Once | ORAL | Status: AC
  Administered 2018-08-06: 8 mg via ORAL
  Filled 2018-08-06: qty 2

## 2018-08-06 MED ORDER — AZACITIDINE CHEMO SQ INJECTION
75.0000 mg/m2 | Freq: Once | INTRAMUSCULAR | Status: AC
  Administered 2018-08-06: 122.5 mg via SUBCUTANEOUS
  Filled 2018-08-06: qty 4.9

## 2018-08-11 ENCOUNTER — Encounter: Payer: Self-pay | Admitting: Oncology

## 2018-08-11 ENCOUNTER — Telehealth: Payer: Self-pay

## 2018-08-11 NOTE — Telephone Encounter (Signed)
Pt states she is having a rough time right now with s/e from chemotherapy. Pt states she has been off chemo for 3 weeks but has to go back on it. Pt declined scheduling an AWV at this time. Advised pt to CB once she feels up to scheduling. Pt stated understanding.  -MM

## 2018-08-12 ENCOUNTER — Telehealth: Payer: Self-pay

## 2018-08-12 ENCOUNTER — Encounter: Payer: Self-pay | Admitting: Oncology

## 2018-08-12 ENCOUNTER — Inpatient Hospital Stay (HOSPITAL_BASED_OUTPATIENT_CLINIC_OR_DEPARTMENT_OTHER): Payer: Medicare Other | Admitting: Oncology

## 2018-08-12 ENCOUNTER — Inpatient Hospital Stay: Payer: Medicare Other | Admitting: Oncology

## 2018-08-12 ENCOUNTER — Inpatient Hospital Stay: Payer: Medicare Other

## 2018-08-12 VITALS — BP 105/65 | HR 86 | Temp 98.5°F | Resp 18 | Ht 61.0 in | Wt 125.0 lb

## 2018-08-12 DIAGNOSIS — C9200 Acute myeloblastic leukemia, not having achieved remission: Secondary | ICD-10-CM

## 2018-08-12 DIAGNOSIS — D61818 Other pancytopenia: Secondary | ICD-10-CM

## 2018-08-12 DIAGNOSIS — K5900 Constipation, unspecified: Secondary | ICD-10-CM

## 2018-08-12 DIAGNOSIS — Z9012 Acquired absence of left breast and nipple: Secondary | ICD-10-CM

## 2018-08-12 DIAGNOSIS — R3911 Hesitancy of micturition: Secondary | ICD-10-CM

## 2018-08-12 DIAGNOSIS — K649 Unspecified hemorrhoids: Secondary | ICD-10-CM

## 2018-08-12 DIAGNOSIS — Z853 Personal history of malignant neoplasm of breast: Secondary | ICD-10-CM

## 2018-08-12 LAB — URINALYSIS, COMPLETE (UACMP) WITH MICROSCOPIC
BILIRUBIN URINE: NEGATIVE
Glucose, UA: NEGATIVE mg/dL
KETONES UR: NEGATIVE mg/dL
Nitrite: NEGATIVE
Protein, ur: NEGATIVE mg/dL
SPECIFIC GRAVITY, URINE: 1.011 (ref 1.005–1.030)
pH: 6 (ref 5.0–8.0)

## 2018-08-12 LAB — COMPREHENSIVE METABOLIC PANEL
ALT: 16 U/L (ref 0–44)
AST: 15 U/L (ref 15–41)
Albumin: 3.7 g/dL (ref 3.5–5.0)
Alkaline Phosphatase: 76 U/L (ref 38–126)
Anion gap: 8 (ref 5–15)
BILIRUBIN TOTAL: 0.9 mg/dL (ref 0.3–1.2)
BUN: 18 mg/dL (ref 8–23)
CHLORIDE: 102 mmol/L (ref 98–111)
CO2: 26 mmol/L (ref 22–32)
Calcium: 9.4 mg/dL (ref 8.9–10.3)
Creatinine, Ser: 0.58 mg/dL (ref 0.44–1.00)
Glucose, Bld: 120 mg/dL — ABNORMAL HIGH (ref 70–99)
Potassium: 3.7 mmol/L (ref 3.5–5.1)
Sodium: 136 mmol/L (ref 135–145)
TOTAL PROTEIN: 6.1 g/dL — AB (ref 6.5–8.1)

## 2018-08-12 LAB — CBC WITH DIFFERENTIAL/PLATELET
BASOS ABS: 0 10*3/uL (ref 0.0–0.1)
BLASTS: 0 %
Band Neutrophils: 0 %
Basophils Relative: 0 %
Eosinophils Absolute: 0 10*3/uL (ref 0.0–0.5)
Eosinophils Relative: 0 %
HEMATOCRIT: 22.5 % — AB (ref 36.0–46.0)
HEMOGLOBIN: 7.4 g/dL — AB (ref 12.0–15.0)
Lymphocytes Relative: 48 %
Lymphs Abs: 2.5 10*3/uL (ref 0.7–4.0)
MCH: 28 pg (ref 26.0–34.0)
MCHC: 32.9 g/dL (ref 30.0–36.0)
MCV: 85.2 fL (ref 80.0–100.0)
METAMYELOCYTES PCT: 0 %
Monocytes Absolute: 0.7 10*3/uL (ref 0.1–1.0)
Monocytes Relative: 14 %
Myelocytes: 0 %
NEUTROS PCT: 38 %
Neutro Abs: 1.9 10*3/uL (ref 1.7–7.7)
Other: 0 %
PLATELETS: 36 10*3/uL — AB (ref 150–400)
PROMYELOCYTES RELATIVE: 0 %
RBC: 2.64 MIL/uL — AB (ref 3.87–5.11)
RDW: 14.9 % (ref 11.5–15.5)
WBC: 5.1 10*3/uL (ref 4.0–10.5)
nRBC: 0 /100 WBC

## 2018-08-12 LAB — SAMPLE TO BLOOD BANK

## 2018-08-12 NOTE — Progress Notes (Signed)
Pain level in rectum ( 7) the patient c/o while sitting

## 2018-08-12 NOTE — Telephone Encounter (Signed)
I can see her at 2.45 today. When is her next set of labs?

## 2018-08-12 NOTE — Telephone Encounter (Signed)
Spoke with Crystal Carpenter to see if she would like to come in for a visit to see the Dr/ Per the message Dr Janese Banks received the patient has been having some constipation. The patient states she will not come in for a appointment today she states she is not constipation. She stated she has been taking Mirlax on Saturday and been having milky stools. The patient states she has not been eatten until Friday. The patient states she has been taking Sit bath and that it is only 2 spots on her bottom that is bothering her. The patient does states she has hemorroids and has been using preparation h with no relief. The patient call back and agreed to come for appointment at 3 pm today

## 2018-08-13 ENCOUNTER — Telehealth: Payer: Self-pay | Admitting: *Deleted

## 2018-08-13 ENCOUNTER — Other Ambulatory Visit: Payer: Self-pay | Admitting: *Deleted

## 2018-08-13 DIAGNOSIS — C9200 Acute myeloblastic leukemia, not having achieved remission: Secondary | ICD-10-CM

## 2018-08-13 NOTE — Telephone Encounter (Signed)
Called patient to let her know that her hemoglobin was 7.4 and her platelets were 36.  He will not need a transfusion set up for tomorrow so she does not have to come anymore this week.  Also I am making appointments for her next dose of by days in which we will start on November 12 but they will be 1 week in between and we will have to add her on for labs and possible blood and platelets the week that she does not have any vidaza.  I will call her with a list of all appointments including the weeks that she is on her vidaza.  she is agreeable to plan

## 2018-08-13 NOTE — Telephone Encounter (Signed)
Called to check and see about her urine results today.  Dr. Janese Banks says she has some bacteria in her urine but would like to wait till the culture comes back to see whether or not she needs an antibiotic.  I have passed that information along to the patient.  She says that she can feel hard stool down at the end of her rectum.  She has taken to senna this morning early with no results I told her she does not get results by this afternoon she can do a MiraLAX dose.  Patient understands and agrees with plan

## 2018-08-14 ENCOUNTER — Emergency Department
Admission: EM | Admit: 2018-08-14 | Discharge: 2018-08-14 | Disposition: A | Discharge status: Home / Self Care | Payer: Medicare Other | Source: Home / Self Care | Attending: Emergency Medicine | Admitting: Emergency Medicine

## 2018-08-14 ENCOUNTER — Inpatient Hospital Stay: Payer: Medicare Other

## 2018-08-14 ENCOUNTER — Telehealth: Payer: Self-pay | Admitting: *Deleted

## 2018-08-14 ENCOUNTER — Other Ambulatory Visit: Payer: Self-pay

## 2018-08-14 ENCOUNTER — Other Ambulatory Visit: Payer: Self-pay | Admitting: *Deleted

## 2018-08-14 ENCOUNTER — Emergency Department: Payer: Medicare Other

## 2018-08-14 ENCOUNTER — Encounter: Payer: Self-pay | Admitting: Emergency Medicine

## 2018-08-14 ENCOUNTER — Inpatient Hospital Stay: Payer: Medicare Other | Admitting: Oncology

## 2018-08-14 DIAGNOSIS — Z79899 Other long term (current) drug therapy: Secondary | ICD-10-CM | POA: Insufficient documentation

## 2018-08-14 DIAGNOSIS — I10 Essential (primary) hypertension: Secondary | ICD-10-CM

## 2018-08-14 DIAGNOSIS — Z856 Personal history of leukemia: Secondary | ICD-10-CM

## 2018-08-14 DIAGNOSIS — I2510 Atherosclerotic heart disease of native coronary artery without angina pectoris: Secondary | ICD-10-CM | POA: Insufficient documentation

## 2018-08-14 DIAGNOSIS — Z853 Personal history of malignant neoplasm of breast: Secondary | ICD-10-CM | POA: Insufficient documentation

## 2018-08-14 DIAGNOSIS — K5641 Fecal impaction: Secondary | ICD-10-CM

## 2018-08-14 DIAGNOSIS — J449 Chronic obstructive pulmonary disease, unspecified: Secondary | ICD-10-CM | POA: Insufficient documentation

## 2018-08-14 DIAGNOSIS — K5901 Slow transit constipation: Secondary | ICD-10-CM

## 2018-08-14 DIAGNOSIS — K5900 Constipation, unspecified: Secondary | ICD-10-CM | POA: Diagnosis not present

## 2018-08-14 MED ORDER — SODIUM CHLORIDE 0.9 % IV BOLUS: 500.0000 mL | Freq: Once | INTRAVENOUS | Status: DC

## 2018-08-14 MED ORDER — AMOXICILLIN-POT CLAVULANATE 875-125 MG PO TABS: 1.0000 | ORAL_TABLET | Freq: Two times a day (BID) | ORAL | 0 refills | Status: DC

## 2018-08-14 MED ORDER — ONDANSETRON HCL 4 MG/2ML IJ SOLN: 4.0000 mg | Freq: Once | INTRAMUSCULAR | Status: DC

## 2018-08-14 MED ORDER — LIDOCAINE VISCOUS HCL 2 % MT SOLN
OROMUCOSAL | Status: AC
  Administered 2018-08-14: 15 mL via OROMUCOSAL
  Filled 2018-08-14: qty 15

## 2018-08-14 MED ORDER — LIDOCAINE VISCOUS HCL 2 % MT SOLN
15.0000 mL | Freq: Once | OROMUCOSAL | Status: AC
  Administered 2018-08-14: 15 mL via OROMUCOSAL

## 2018-08-14 NOTE — ED Triage Notes (Signed)
Patient to ER for c/o constipation, nausea, and weakness. Patient states she is currently doing chemo (leukemia). Patient states last BM was more than a week ago.

## 2018-08-14 NOTE — Discharge Instructions (Addendum)
Magnesium citrate, miralax and senna are all things to try

## 2018-08-14 NOTE — ED Notes (Addendum)
Per Dr Corky Downs not to start IV, give fluids, or medication until after obtaining x-ray

## 2018-08-14 NOTE — ED Notes (Signed)
Pt reports she is taking chemo and experiencing constipation - last chemo 2 weeks ago  - decreased appetite since last Thursday - pt has taken several laxatives without results - pt reports having "stool in her rectum" and request to be sedated prior to checking her rectum

## 2018-08-14 NOTE — ED Provider Notes (Signed)
[facility] Emergency Department Provider Note   ____________________________________________    I have reviewed the triage vital signs and the nursing notes.   HISTORY  Chief Complaint Constipation and Weakness     HPI Crystal Carpenter is a 84 y.o. female who presents with complaints of constipation.  Patient is undergoing chemotherapy for leukemia however has not had any chemo in the last 2 weeks.  She reports that she has had significant constipation and has been straining to have a bowel movement.  She feels that there is stool stuck in her rectum.  Because of this she has not had much of an appetite.  Has tried over-the-counter medications without improvement.  She denies abdominal pain, feels bloated   Past Medical History:  Diagnosis Date  . Acute myeloid leukemia ([place])   . Arthritis   . Cancer [facility]) July 2014   T2,N2a, Modified radical mastectomy. 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 COPD (chronic obstructive pulmonary disease) ([place])   . Degenerative disc disease, lumbar   . Hypertension   . Malignant neoplasm of breast (female), unspecified site July 2014   Her case was presented at the [facility] tumor board in July 2014. Recommendations were for chest wall/peripheral lymphatic radiation if metastatic disease was not identified, anti-estrogen therapy only if additional metastatic disease was detected on a PET CT. No plans for adjuvant chemotherapy if metastatic disease was identified was recommended.  . Osteoporosis   . Sciatic pain 2015  . Spinal stenosis     Patient Active Problem List   Diagnosis Date Noted  . Severe protein-calorie malnutrition ([place]) 06/10/2018  . AML (acute myeloblastic leukemia) ([place]) 04/09/2018  . Aortic atherosclerosis ([place]) 02/10/2018  . Chronic cough 02/10/2018  . Malignant neoplasm of upper-outer quadrant of left breast in female, estrogen receptor positive ([place]) 05/22/2017  . Allergic  rhinitis 08/25/2015  . Arthropathia 08/25/2015  . Clinical depression 08/25/2015  . Urinary system disease 08/25/2015  . Calcium blood increased 08/25/2015  . Lumbar canal stenosis 06/08/2014  . Personal history of malignant neoplasm of breast 06/30/2013  . Malignant neoplasm of breast (female) ([place]) 05/25/2013  . Cancer ([place])   . Breast cancer ([place]) 04/09/2013  . Acid reflux 02/24/2010  . Hypercholesterolemia 02/24/2010  . Avitaminosis D 02/28/2009  . BP (high blood pressure) 07/01/2007    Past Surgical History:  Procedure Laterality Date  . ABDOMINAL HYSTERECTOMY    . BREAST SURGERY Left 04-28-13   left mastectomy, SN biopsy  . EPIDURAL BLOCK INJECTION  2015   x3    Prior to Admission medications   Medication Sig Start Date End Date Taking? Authorizing Provider  alendronate (FOSAMAX) 70 MG tablet TAKE 1 TABLET BY MOUTH EVERY 7 DAYS. TAKE WITH A FULL GLASS OF WATER ON AN EMPTY STOMACH. 02/26/18   Jerrol Banana., MD  azelastine (ASTELIN) 0.1 % nasal spray Place into both nostrils 2 (two) times daily as needed. Use in each nostril as directed     [provider]  b complex vitamins tablet Take 1 tablet by mouth daily.    [provider]  ciprofloxacin (CIPRO) 500 MG tablet Take 1 tablet (500 mg total) by mouth 2 (two) times daily. Patient not taking: Reported on 07/04/2018 05/08/18   Sindy Guadeloupe, MD  docusate sodium (COLACE) 100 MG capsule Take 100 mg by mouth daily.    [provider]  famotidine (PEPCID) 10 MG tablet Take 10 mg by mouth  daily as needed for heartburn or indigestion.    [provider]  fluticasone (FLONASE) 50 MCG/ACT nasal spray Place 1 spray into both nostrils daily as needed.  10/18/17   [provider]  hydrochlorothiazide (HYDRODIURIL) 25 MG tablet TAKE 1 TABLET BY MOUTH EVERY DAY 08/12/17   Gilbert, Richard L Jr., MD  LORazepam (ATIVAN) 0.5 MG tablet Take 1 tablet (0.5 mg total) by mouth every 6 (six) hours  as needed (Nausea or vomiting). Patient not taking: Reported on 05/22/2018 04/09/18   Rao, Archana C, MD  magic mouthwash SOLN Take 5 mLs by mouth 4 (four) times daily as needed for mouth pain. Patient not taking: Reported on 06/06/2018 05/19/18   Rao, Archana C, MD  naproxen sodium (ALEVE) 220 MG tablet Take 220 mg by mouth daily as needed.     [provider]  OLANZapine (ZYPREXA) 5 MG tablet Take 1 tablet (5 mg total) by mouth at bedtime. Patient not taking: Reported on 07/25/2018 06/05/18   Allen, Lauren G, NP  ondansetron (ZOFRAN) 8 MG tablet Take 1 tablet (8 mg total) by mouth 2 (two) times daily as needed (Nausea or vomiting). Patient not taking: Reported on 05/22/2018 04/09/18   Rao, Archana C, MD  potassium chloride (MICRO-K) 10 MEQ CR capsule TAKE 1 CAPSULE BY MOUTH EVERY DAY. 02/26/18   Gilbert, Richard L Jr., MD  prochlorperazine (COMPAZINE) 10 MG tablet Take 1 tablet (10 mg total) by mouth every 6 (six) hours as needed (Nausea or vomiting). Patient not taking: Reported on 05/22/2018 04/09/18   Rao, Archana C, MD  Saline 0.2 % SOLN     [provider]  sertraline (ZOLOFT) 100 MG tablet TAKE 1 TABLET BY MOUTH EVERY DAY Patient not taking: Reported on 06/20/2018 07/15/17   Gilbert, Richard L Jr., MD     Allergies Tape; Gabapentin; Sulfa antibiotics; Allopurinol; and Shellfish allergy  Family History  Problem Relation Age of Onset  . Stroke Mother   . Heart disease Father   . Stroke Father   . Parkinson's disease Sister   . Parkinson's disease Brother     Social History Social History   Tobacco Use  . Smoking status: Never Smoker  . Smokeless tobacco: Never Used  Substance Use Topics  . Alcohol use: No  . Drug use: No    Review of Systems  Constitutional: No fever/chills   Cardiovascular: Denies chest pain. Respiratory: Denies shortness of breath. Gastrointestinal: As above Genitourinary: Negative for dysuria.   Neurological: Negative for  headaches   ____________________________________________   PHYSICAL EXAM:  VITAL SIGNS: ED Triage Vitals  Enc Vitals Group     BP 08/14/18 0950 128/60     Pulse Rate 08/14/18 0950 80     Resp 08/14/18 0950 20     Temp 08/14/18 0950 98.1 F (36.7 C)     Temp Source 08/14/18 0950 Oral     SpO2 08/14/18 0950 97 %     Weight 08/14/18 0949 56.7 kg (125 lb)     Height 08/14/18 0949 1.549 m (5' 1")     Head Circumference --      Peak Flow --      Pain Score 08/14/18 0948 9     Pain Loc --      Pain Edu? --      Excl. in GC? --     Constitutional: Alert and oriented. No acute distress.   Nose: No congestion/rhinnorhea. Mouth/Throat: Mucous membranes are moist.   Neck:  Painless   ROM Cardiovascular: Normal rate, regular rhythm.  Good peripheral circulation. Respiratory: Normal respiratory effort.  No retractions.  Gastrointestinal: Soft and nontender. No distention.    Musculoskeletal:  Warm and well perfused Neurologic:  Normal speech and language. No gross focal neurologic deficits are appreciated.  Skin:  Skin is warm, dry and intact. No rash noted. Psychiatric: Mood and affect are normal. Speech and behavior are normal.  ____________________________________________   LABS (all labs ordered are listed, but only abnormal results are displayed)  Labs Reviewed - No data to display ____________________________________________  EKG  None ____________________________________________  RADIOLOGY  KUB demonstrates moderate stool burden rectosigmoid colon ____________________________________________   PROCEDURES  Procedure(s) performed: yes  ------------------------------------------------------------------------------------------------------------------- Fecal Disimpaction Procedure Note:  Performed by me:  Patient placed in the lateral recumbent position with knees drawn towards chest. Nurse present for patient support. Large amount of brown stool removed. No  complications during procedure.   ------------------------------------------------------------------------------------------------------------------    Procedures   Critical Care performed: No ____________________________________________   INITIAL IMPRESSION / ASSESSMENT AND PLAN / ED COURSE  Pertinent labs & imaging results that were available during my care of the patient were reviewed by me and considered in my medical decision making (see chart for details).  Patient recently had labs, she declined labs in the emergency department.  Abdominal exam is overall reassuring.  KUB demonstrates moderate stool burden but no clear impaction.  Disimpaction performed by me, able to remove large amount of soft brown stool.    ____________________________________________   FINAL CLINICAL IMPRESSION(S) / ED DIAGNOSES  Final diagnoses:  Fecal impaction in rectum (HCC)  Slow transit constipation        Note:  This document was prepared using Dragon voice recognition software and may include unintentional dictation errors.    Lavonia Drafts, MD 08/14/18 [phone]

## 2018-08-15 LAB — URINE CULTURE

## 2018-08-15 NOTE — Progress Notes (Signed)
Hematology/Oncology Consult note [facility]  Telephone:([phone] [phone]  Patient Care Team: Jerrol Banana., MD as PCP - General (Family Medicine) Bary Castilla, Forest Gleason, MD as Consulting Physician (General Surgery) Idelle Leech, [place] as Consulting Physician (Optometry) Sindy Guadeloupe, MD as Medical Oncologist (Medical Oncology)   Name of the patient: Crystal Carpenter  [id number]  [birth year]   Date of visit: 08/15/18  Diagnosis-refractory AML  Chief complaint/ Reason for visit-sick visit for constipation  Heme/Onc history: patient is a 84 year old female with a past medical history significant for breast cancer, hypercholesterolemia and hypertension among other medical problems. She has been referred to [place] for anemia. Recent CBC from 09/20/2017 showed white count of 5.6, H&H of 9.6/29.8 and a platelet count of 105. TSH was normal at 1.11. CMP was within normal limits. Serum iron was normal at 103. B12 levels were elevated at 1905 and folate was normal at greater than 24. Stool FOBT was negative.  Patient lives with her 23 year old husband and is independent of her ADLs and IADLs. She is still driving. She does feel fatigued on and off but today she reports feeling well. She denies any blood in her stool or urine.  Results of blood work from 10/21/2017 were as follows: CBC showed white count of 10.6, H&H of 10.1/31.9 with a platelet count of 210. Iron studies, haptoglobin and reticulocyte count was normal. Multiple myeloma panel showed no monoclonal protein and IFE was normal  Patients hb dropped down to 6.4 in may 2019. She received 2 units of prbc transfusion. She also had repeat anemia work up which was unremarkable. She then had progressive leucocytosis with wbc in 20's and left shift. She underwent bone marrow biopsy on 04/03/18. Noted to have 19% myeloid blasts in the marrow and 20% blasts in peripheral blood consistent with acute  myeloid leukemia  NGS panel showed NF1 but no other mutations. She was seen by Dr. Janene Madeira at [facility]. She is curently on venetoclax. She is receiving [place] vidaza 7 days a month 1st dose was on 04/23/18  Bone marrow biopsy on day 20 postvidazastill showed 19% blasts.Patient was continued on Vidaza and venetoclax and repeat bone marrow biopsy at 2 months showed less than 5% blasts.   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  Multiple second line options have been discussed with the patient and she is chosen to proceed with single agent Vidaza for palliation   Interval history: Patient reports some pain in her perianal area over the last few days and feels that the area is irritated.  She is having some soft bowel movements.  She did try to manually disimpact herself thinking that there is possibly some stool in the rectum but she did not get any bowel movement after that.  Denies any fevers.  Has chronic fatigue  ECOG PS- 2 Pain scale- 7 Opioid associated constipation- no  Review of systems- Review of Systems  Constitutional: Positive for malaise/fatigue. Negative for chills, fever and weight loss.  HENT: Negative for congestion, ear discharge and nosebleeds.   Eyes: Negative for blurred vision.  Respiratory: Negative for cough, hemoptysis, sputum production, shortness of breath and wheezing.   Cardiovascular: Negative for chest pain, palpitations, orthopnea and claudication.  Gastrointestinal: Negative for abdominal pain, blood in stool, constipation, diarrhea, heartburn, melena, nausea and vomiting.       Rectal pain  Genitourinary: Negative  for dysuria, flank pain, frequency, hematuria and urgency.  Musculoskeletal: Negative for back pain, joint pain and myalgias.  Skin: Negative for rash.  Neurological: Negative for dizziness, tingling, focal weakness, seizures, weakness and  headaches.  Endo/Heme/Allergies: Does not bruise/bleed easily.  Psychiatric/Behavioral: Negative for depression and suicidal ideas. The patient does not have insomnia.      Allergies  Allergen Reactions  . Tape Rash  . Gabapentin     Headache, "felt weird in the head"  . Sulfa Antibiotics   . Allopurinol Rash  . Shellfish Allergy Rash     Past Medical History:  Diagnosis Date  . Acute myeloid leukemia ([place])   . Arthritis   . Cancer [facility]) July 2014   T2,N2a, Modified radical mastectomy. 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 COPD (chronic obstructive pulmonary disease) ([place])   . Degenerative disc disease, lumbar   . Hypertension   . Malignant neoplasm of breast (female), unspecified site July 2014   Her case was presented at the [facility] tumor board in July 2014. Recommendations were for chest wall/peripheral lymphatic radiation if metastatic disease was not identified, anti-estrogen therapy only if additional metastatic disease was detected on a PET CT. No plans for adjuvant chemotherapy if metastatic disease was identified was recommended.  . Osteoporosis   . Sciatic pain 2015  . Spinal stenosis      Past Surgical History:  Procedure Laterality Date  . ABDOMINAL HYSTERECTOMY    . BREAST SURGERY Left 04-28-13   left mastectomy, SN biopsy  . EPIDURAL BLOCK INJECTION  2015   x3    Social History   Socioeconomic History  . Marital status: Married    Spouse name: Not on file  . Number of children: Not on file  . Years of education: Not on file  . Highest education level: Not on file  Occupational History  . Not on file  Social Needs  . Financial resource strain: Not on file  . Food insecurity:    Worry: Not on file    Inability: Not on file  . Transportation needs:    Medical: Not on file    Non-medical: Not on file  Tobacco Use  . Smoking status: Never Smoker  . Smokeless tobacco: Never Used  Substance and Sexual Activity  . Alcohol use:  No  . Drug use: No  . Sexual activity: Not on file  Lifestyle  . Physical activity:    Days per week: Not on file    Minutes per session: Not on file  . Stress: Not on file  Relationships  . Social connections:    Talks on phone: Not on file    Gets together: Not on file    Attends religious service: Not on file    Active member of club or organization: Not on file    Attends meetings of clubs or organizations: Not on file    Relationship status: Not on file  . Intimate partner violence:    Fear of current or ex partner: Not on file    Emotionally abused: Not on file    Physically abused: Not on file    Forced sexual activity: Not on file  Other Topics Concern  . Not on file  Social History Narrative  . Not on file    Family History  Problem Relation Age of Onset  . Stroke Mother   . Heart disease Father   . Stroke Father   . Parkinson's disease Sister   .  Parkinson's disease Brother      Current Outpatient Medications:  .  alendronate (FOSAMAX) 70 MG tablet, TAKE 1 TABLET BY MOUTH EVERY 7 DAYS. TAKE WITH A FULL GLASS OF WATER ON AN EMPTY STOMACH., Disp: 12 tablet, Rfl: 3 .  b complex vitamins tablet, Take 1 tablet by mouth daily., Disp: , Rfl:  .  docusate sodium (COLACE) 100 MG capsule, Take 100 mg by mouth daily., Disp: , Rfl:  .  hydrochlorothiazide (HYDRODIURIL) 25 MG tablet, TAKE 1 TABLET BY MOUTH EVERY DAY, Disp: 90 tablet, Rfl: 3 .  potassium chloride (MICRO-K) 10 MEQ CR capsule, TAKE 1 CAPSULE BY MOUTH EVERY DAY., Disp: 90 capsule, Rfl: 3 .  amoxicillin-clavulanate (AUGMENTIN) 875-125 MG tablet, Take 1 tablet by mouth 2 (two) times daily., Disp: 14 tablet, Rfl: 0 .  azelastine (ASTELIN) 0.1 % nasal spray, Place into both nostrils 2 (two) times daily as needed. Use in each nostril as directed , Disp: , Rfl:  .  ciprofloxacin (CIPRO) 500 MG tablet, Take 1 tablet (500 mg total) by mouth 2 (two) times daily. (Patient not taking: Reported on 07/04/2018), Disp: 14  tablet, Rfl: 0 .  famotidine (PEPCID) 10 MG tablet, Take 10 mg by mouth daily as needed for heartburn or indigestion., Disp: , Rfl:  .  fluticasone (FLONASE) 50 MCG/ACT nasal spray, Place 1 spray into both nostrils daily as needed. , Disp: , Rfl:  .  LORazepam (ATIVAN) 0.5 MG tablet, Take 1 tablet (0.5 mg total) by mouth every 6 (six) hours as needed (Nausea or vomiting). (Patient not taking: Reported on 05/22/2018), Disp: 30 tablet, Rfl: 0 .  magic mouthwash SOLN, Take 5 mLs by mouth 4 (four) times daily as needed for mouth pain. (Patient not taking: Reported on 06/06/2018), Disp: 240 mL, Rfl: 0 .  naproxen sodium (ALEVE) 220 MG tablet, Take 220 mg by mouth daily as needed. , Disp: , Rfl:  .  OLANZapine (ZYPREXA) 5 MG tablet, Take 1 tablet (5 mg total) by mouth at bedtime. (Patient not taking: Reported on 07/25/2018), Disp: 30 tablet, Rfl: 0 .  ondansetron (ZOFRAN) 8 MG tablet, Take 1 tablet (8 mg total) by mouth 2 (two) times daily as needed (Nausea or vomiting). (Patient not taking: Reported on 05/22/2018), Disp: 30 tablet, Rfl: 1 .  prochlorperazine (COMPAZINE) 10 MG tablet, Take 1 tablet (10 mg total) by mouth every 6 (six) hours as needed (Nausea or vomiting). (Patient not taking: Reported on 05/22/2018), Disp: 30 tablet, Rfl: 1 .  Saline 0.2 % SOLN, , Disp: , Rfl:  .  sertraline (ZOLOFT) 100 MG tablet, TAKE 1 TABLET BY MOUTH EVERY DAY (Patient not taking: Reported on 06/20/2018), Disp: 30 tablet, Rfl: 11 No current facility-administered medications for this visit.   Facility-Administered Medications Ordered in Other Visits:  .  heparin lock flush 100 unit/mL, 500 Units, Intracatheter, Daily PRN, Sindy Guadeloupe, MD .  sodium chloride flush (NS) 0.9 % injection 10 mL, 10 mL, Intracatheter, PRN, Sindy Guadeloupe, MD .  sodium chloride flush (NS) 0.9 % injection 3 mL, 3 mL, Intracatheter, PRN, Sindy Guadeloupe, MD  Physical exam:  Vitals:   08/12/18 1522  BP: 105/65  Pulse: 86  Resp: 18  Temp: 98.5 F  (36.9 C)  TempSrc: Tympanic  SpO2: 97%  Weight: 125 lb (56.7 kg)  Height: '5\' 1"'$  (1.549 m)   Physical Exam  Constitutional: She is oriented to person, place, and time.  Thin frail elderly female sitting in a wheelchair and  appears in no acute distress  HENT:  Head: Normocephalic and atraumatic.  Eyes: Pupils are equal, round, and reactive to light. EOM are normal.  Neck: Normal range of motion.  Cardiovascular: Normal rate, regular rhythm and normal heart sounds.  Pulmonary/Chest: Effort normal and breath sounds normal.  Abdominal: Soft. Bowel sounds are normal.  Perianal area was examined there was no evidence of dermatitis or infection/ulceration.  Small amount of liquid stool was noted on patient's diaper.  Rectal exam was not performed  Neurological: She is alert and oriented to person, place, and time.  Skin: Skin is warm and dry.     CMP Latest Ref Rng & Units 08/12/2018  Glucose 70 - 99 mg/dL 120(H)  BUN 8 - 23 mg/dL 18  Creatinine 0.44 - 1.00 mg/dL 0.58  Sodium 135 - 145 mmol/L 136  Potassium 3.5 - 5.1 mmol/L 3.7  Chloride 98 - 111 mmol/L 102  CO2 22 - 32 mmol/L 26  Calcium 8.9 - 10.3 mg/dL 9.4  Total Protein 6.5 - 8.1 g/dL 6.1(L)  Total Bilirubin 0.3 - 1.2 mg/dL 0.9  Alkaline Phos 38 - 126 U/L 76  AST 15 - 41 U/L 15  ALT 0 - 44 U/L 16   CBC Latest Ref Rng & Units 08/12/2018  WBC 4.0 - 10.5 K/uL 5.1  Hemoglobin 12.0 - 15.0 g/dL 7.4(L)  Hematocrit 36.0 - 46.0 % 22.5(L)  Platelets 150 - 400 K/uL 36(L)    No images are attached to the encounter.  Dg Abdomen 1 View  Result Date: 08/14/2018 CLINICAL DATA:  Constipation, question impaction EXAM: ABDOMEN - 1 VIEW COMPARISON:  None. FINDINGS: Moderate stool burden in the rectosigmoid colon. No evidence of bowel obstruction, organomegaly or free air. Scoliosis and degenerative changes in the lumbar spine. IMPRESSION: Moderate stool burden in the rectosigmoid colon. No evidence of bowel obstruction Electronically Signed    By: Rolm Baptise M.D.   On: 08/14/2018 10:38     Assessment and plan- Patient is a 84 y.o. female with refractory AML status post 2 cycles of 5 days here for rectal pain  I did not perform a rectal exam today because patient has known pancytopenia from her AML and has been neutropenic in the past.  I have encouraged her to continue her bowel medications and add senna to see if her constipation gets better.  I do not see any evidence of perianal dermatitis or infection/ulceration.  I have recommended that she can try using Desitin around her penis if she feels that the area is irritated.  She can also continue with preparation each bath which she had been using in the past for her hemorrhoids.  Patient is reporting some urinary symptoms and reports some hesitancy.  I will check urinalysis and urine culture today.  I will check her CBC with differential today for possible transfusion  She will return to clinic next week for CBC with differential and CMP for possible blood and or platelet transfusion.  I will see her on 08/26/2018 with blood work to start her cycle 3 of Vidaza.  She will get 7 continuous days of vidaza excluding Saturday and Sunday.  Visit Diagnosis 1. Acute myeloid leukemia not having achieved remission ([place])   2. Constipation, unspecified constipation type      Dr. Randa Evens, MD, MPH [facility] [phone] 08/15/2018 8:21 AM

## 2018-08-18 ENCOUNTER — Telehealth: Payer: Self-pay | Admitting: *Deleted

## 2018-08-18 NOTE — Telephone Encounter (Signed)
Called pt. And let her know that her next appt is 11/6 at 8:30. She will need labs and poss. Blood depending on lab results. She will then get her next appt at that time. She is agreeable to the plan

## 2018-08-20 ENCOUNTER — Inpatient Hospital Stay: Payer: Medicare Other | Attending: Oncology

## 2018-08-20 ENCOUNTER — Inpatient Hospital Stay: Payer: Medicare Other

## 2018-08-20 ENCOUNTER — Other Ambulatory Visit: Payer: Self-pay | Admitting: Oncology

## 2018-08-20 DIAGNOSIS — D61818 Other pancytopenia: Secondary | ICD-10-CM | POA: Insufficient documentation

## 2018-08-20 DIAGNOSIS — D709 Neutropenia, unspecified: Secondary | ICD-10-CM | POA: Insufficient documentation

## 2018-08-20 DIAGNOSIS — Z853 Personal history of malignant neoplasm of breast: Secondary | ICD-10-CM | POA: Diagnosis not present

## 2018-08-20 DIAGNOSIS — C9200 Acute myeloblastic leukemia, not having achieved remission: Secondary | ICD-10-CM | POA: Insufficient documentation

## 2018-08-20 DIAGNOSIS — Z9012 Acquired absence of left breast and nipple: Secondary | ICD-10-CM | POA: Diagnosis not present

## 2018-08-20 DIAGNOSIS — D649 Anemia, unspecified: Secondary | ICD-10-CM

## 2018-08-20 LAB — SAMPLE TO BLOOD BANK

## 2018-08-20 LAB — PATHOLOGIST SMEAR REVIEW

## 2018-08-20 LAB — PREPARE RBC (CROSSMATCH)

## 2018-08-20 MED ORDER — ACETAMINOPHEN 325 MG PO TABS
650.0000 mg | ORAL_TABLET | Freq: Once | ORAL | Status: AC
  Administered 2018-08-20: 650 mg via ORAL
  Filled 2018-08-20: qty 2

## 2018-08-20 MED ORDER — SODIUM CHLORIDE 0.9% IV SOLUTION
250.0000 mL | Freq: Once | INTRAVENOUS | Status: AC
  Administered 2018-08-20: 250 mL via INTRAVENOUS
  Filled 2018-08-20: qty 250

## 2018-08-21 LAB — TYPE AND SCREEN
ABO/RH(D): A POS
Antibody Screen: NEGATIVE
Unit division: 0
Unit division: 0

## 2018-08-21 LAB — CBC WITH DIFFERENTIAL/PLATELET
ABS IMMATURE GRANULOCYTES: 0.05 10*3/uL (ref 0.00–0.07)
BASOS ABS: 0 10*3/uL (ref 0.0–0.1)
BASOS PCT: 0 %
Blasts: 12 %
EOS ABS: 0.1 10*3/uL (ref 0.0–0.5)
EOS PCT: 2 %
HCT: 17.5 % — ABNORMAL LOW (ref 36.0–46.0)
HEMOGLOBIN: 5.8 g/dL — AB (ref 12.0–15.0)
Immature Granulocytes: 2 %
Lymphocytes Relative: 49 %
Lymphs Abs: 1.5 10*3/uL (ref 0.7–4.0)
MCH: 28.6 pg (ref 26.0–34.0)
MCHC: 33.1 g/dL (ref 30.0–36.0)
MCV: 86.2 fL (ref 80.0–100.0)
Monocytes Absolute: 0.5 10*3/uL (ref 0.1–1.0)
Monocytes Relative: 18 %
NEUTROS PCT: 29 %
Neutro Abs: 0.9 10*3/uL — ABNORMAL LOW (ref 1.7–7.7)
Platelets: 41 10*3/uL — ABNORMAL LOW (ref 150–400)
RBC: 2.03 MIL/uL — AB (ref 3.87–5.11)
RDW: 15.4 % (ref 11.5–15.5)
WBC MORPHOLOGY: ABNORMAL
WBC: 3 10*3/uL — ABNORMAL LOW (ref 4.0–10.5)
nRBC: 0 % (ref 0.0–0.2)

## 2018-08-21 LAB — BPAM RBC
BLOOD PRODUCT EXPIRATION DATE: 201911112359
Blood Product Expiration Date: 201912012359
ISSUE DATE / TIME: 201911061220
ISSUE DATE / TIME: 201911061421
UNIT TYPE AND RH: 6200
Unit Type and Rh: 5100

## 2018-08-25 NOTE — Telephone Encounter (Signed)
See new TE. Pt declined scheduling the AWV at this time.  -MM

## 2018-08-26 ENCOUNTER — Other Ambulatory Visit: Payer: Self-pay

## 2018-08-26 ENCOUNTER — Inpatient Hospital Stay (HOSPITAL_BASED_OUTPATIENT_CLINIC_OR_DEPARTMENT_OTHER): Payer: Medicare Other | Admitting: Oncology

## 2018-08-26 ENCOUNTER — Other Ambulatory Visit: Payer: Self-pay | Admitting: *Deleted

## 2018-08-26 ENCOUNTER — Encounter: Payer: Self-pay | Admitting: Oncology

## 2018-08-26 ENCOUNTER — Inpatient Hospital Stay: Payer: Medicare Other

## 2018-08-26 VITALS — BP 112/61 | HR 75 | Temp 97.8°F | Resp 18 | Ht 61.0 in | Wt 122.1 lb

## 2018-08-26 DIAGNOSIS — C9200 Acute myeloblastic leukemia, not having achieved remission: Secondary | ICD-10-CM | POA: Diagnosis not present

## 2018-08-26 DIAGNOSIS — D709 Neutropenia, unspecified: Secondary | ICD-10-CM

## 2018-08-26 DIAGNOSIS — D61818 Other pancytopenia: Secondary | ICD-10-CM | POA: Diagnosis not present

## 2018-08-26 DIAGNOSIS — Z853 Personal history of malignant neoplasm of breast: Secondary | ICD-10-CM

## 2018-08-26 DIAGNOSIS — Z9012 Acquired absence of left breast and nipple: Secondary | ICD-10-CM

## 2018-08-26 DIAGNOSIS — Z5111 Encounter for antineoplastic chemotherapy: Secondary | ICD-10-CM

## 2018-08-26 DIAGNOSIS — Z7189 Other specified counseling: Secondary | ICD-10-CM

## 2018-08-26 LAB — COMPREHENSIVE METABOLIC PANEL
ALT: 19 U/L (ref 0–44)
AST: 15 U/L (ref 15–41)
Albumin: 3.4 g/dL — ABNORMAL LOW (ref 3.5–5.0)
Alkaline Phosphatase: 79 U/L (ref 38–126)
Anion gap: 5 (ref 5–15)
BILIRUBIN TOTAL: 0.5 mg/dL (ref 0.3–1.2)
BUN: 19 mg/dL (ref 8–23)
CO2: 26 mmol/L (ref 22–32)
CREATININE: 0.59 mg/dL (ref 0.44–1.00)
Calcium: 9.2 mg/dL (ref 8.9–10.3)
Chloride: 105 mmol/L (ref 98–111)
GFR calc Af Amer: >60 mL/min (ref >60)
Glucose, Bld: 113 mg/dL — ABNORMAL HIGH (ref 70–99)
Potassium: 3.9 mmol/L (ref 3.5–5.1)
Sodium: 136 mmol/L (ref 135–145)
Total Protein: 6 g/dL — ABNORMAL LOW (ref 6.5–8.1)

## 2018-08-26 LAB — CBC WITH DIFFERENTIAL/PLATELET
BASOS ABS: 0 10*3/uL (ref 0.0–0.1)
BASOS PCT: 1 %
BLASTS: 10 %
EOS ABS: 0.1 10*3/uL (ref 0.0–0.5)
Eosinophils Relative: 3 %
HCT: 26.4 % — ABNORMAL LOW (ref 36.0–46.0)
Hemoglobin: 8.6 g/dL — ABNORMAL LOW (ref 12.0–15.0)
Lymphocytes Relative: 62 %
Lymphs Abs: 1.2 10*3/uL (ref 0.7–4.0)
MCH: 28.3 pg (ref 26.0–34.0)
MCHC: 32.6 g/dL (ref 30.0–36.0)
MCV: 86.8 fL (ref 80.0–100.0)
Monocytes Absolute: 0.1 10*3/uL (ref 0.1–1.0)
Monocytes Relative: 7 %
NEUTROS ABS: 0.3 10*3/uL — AB (ref 1.7–7.7)
NEUTROS PCT: 17 %
NRBC: 0 % (ref 0.0–0.2)
PLATELETS: 16 10*3/uL — AB (ref 150–400)
RBC: 3.04 MIL/uL — AB (ref 3.87–5.11)
RDW: 14.7 % (ref 11.5–15.5)
WBC: 1.9 10*3/uL — ABNORMAL LOW (ref 4.0–10.5)

## 2018-08-26 LAB — SAMPLE TO BLOOD BANK

## 2018-08-26 MED ORDER — ONDANSETRON HCL 4 MG PO TABS
8.0000 mg | ORAL_TABLET | Freq: Once | ORAL | Status: AC
  Administered 2018-08-26: 8 mg via ORAL
  Filled 2018-08-26: qty 2

## 2018-08-26 MED ORDER — AZACITIDINE CHEMO SQ INJECTION
75.0000 mg/m2 | Freq: Once | INTRAMUSCULAR | Status: AC
  Administered 2018-08-26: 122.5 mg via SUBCUTANEOUS
  Filled 2018-08-26: qty 4.9

## 2018-08-26 MED ORDER — VALACYCLOVIR HCL 500 MG PO TABS: 500.0000 mg | ORAL_TABLET | Freq: Every day | ORAL | 3 refills | Status: AC

## 2018-08-26 MED ORDER — AMOXICILLIN-POT CLAVULANATE 875-125 MG PO TABS: 1.0000 | ORAL_TABLET | Freq: Two times a day (BID) | ORAL | 0 refills | Status: DC

## 2018-08-26 NOTE — Progress Notes (Signed)
Hematology/Oncology Consult note [facility]  Telephone:([phone] [phone]  Patient Care Team: Jerrol Banana., MD as PCP - General (Family Medicine) Bary Castilla, Forest Gleason, MD as Consulting Physician (General Surgery) Idelle Leech, [place] as Consulting Physician (Optometry) Sindy Guadeloupe, MD as Medical Oncologist (Medical Oncology)   Name of the patient: Crystal Carpenter  [id number]  December 30, 1923   Date of visit: 08/26/18  Diagnosis- refractory AML  Chief complaint/ Reason for visit-on treatment assessment prior to cycle 3-day 1 of Vidaza  Heme/Onc history: patient is a 84 year old female with a past medical history significant for breast cancer, hypercholesterolemia and hypertension among other medical problems. She has been referred to [place] for anemia. Recent CBC from 09/20/2017 showed white count of 5.6, H&H of 9.6/29.8 and a platelet count of 105. TSH was normal at 1.11. CMP was within normal limits. Serum iron was normal at 103. B12 levels were elevated at 1905 and folate was normal at greater than 24. Stool FOBT was negative.  Patient lives with her 40 year old husband and is independent of her ADLs and IADLs. She is still driving. She does feel fatigued on and off but today she reports feeling well. She denies any blood in her stool or urine.  Results of blood work from 10/21/2017 were as follows: CBC showed white count of 10.6, H&H of 10.1/31.9 with a platelet count of 210. Iron studies, haptoglobin and reticulocyte count was normal. Multiple myeloma panel showed no monoclonal protein and IFE was normal  Patients hb dropped down to 6.4 in may 2019. She received 2 units of prbc transfusion. She also had repeat anemia work up which was unremarkable. She then had progressive leucocytosis with wbc in 20's and left shift. She underwent bone marrow biopsy on 04/03/18. Noted to have 19% myeloid blasts in the marrow and 20% blasts in peripheral  blood consistent with acute myeloid leukemia  NGS panel showed NF1 but no other mutations. She was seen by Dr. Janene Madeira at [facility]. She is curently on venetoclax. She is receiving Crimora vidaza 7 days a month 1st dose was on 04/23/18  Bone marrow biopsy on day 20 postvidazastill showed 19% blasts.Patient was continued on Vidaza and venetoclax and repeat bone marrow biopsy at 2 months showed less than 5% blasts.   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  Multiple second line options have been discussed with the patient and she is chosen to proceed with single agent Vidaza for palliation   Interval history-patient is doing fair at this time.  Reports no constipation and she is currently taking MiraLAX daily and has a bowel movement every day or every other day.  Denies any fevers.  Denies any cough or sputum production.  She had some intermittent leg swelling which gets better  ECOG PS- 2 Pain scale- 0 Opioid associated constipation- no  Review of systems- Review of Systems  Constitutional: Positive for malaise/fatigue. Negative for chills, fever and weight loss.  HENT: Negative for congestion, ear discharge and nosebleeds.   Eyes: Negative for blurred vision.  Respiratory: Negative for cough, hemoptysis, sputum production, shortness of breath and wheezing.   Cardiovascular: Negative for chest pain, palpitations, orthopnea and claudication.  Gastrointestinal: Negative for abdominal pain, blood in stool, constipation, diarrhea, heartburn, melena, nausea and vomiting.  Genitourinary: Negative for dysuria, flank pain, frequency, hematuria and urgency.  Musculoskeletal: Negative for back pain, joint pain  and myalgias.  Skin: Negative for rash.  Neurological: Negative for dizziness, tingling, focal weakness, seizures, weakness and headaches.  Endo/Heme/Allergies: Does not bruise/bleed easily.    Psychiatric/Behavioral: Negative for depression and suicidal ideas. The patient does not have insomnia.       Allergies  Allergen Reactions  . Tape Rash  . Gabapentin     Headache, "felt weird in the head"  . Sulfa Antibiotics   . Allopurinol Rash  . Shellfish Allergy Rash     Past Medical History:  Diagnosis Date  . Acute myeloid leukemia ([place])   . Arthritis   . Cancer [facility]) July 2014   T2,N2a, Modified radical mastectomy. 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 COPD (chronic obstructive pulmonary disease) ([place])   . Degenerative disc disease, lumbar   . Hypertension   . Malignant neoplasm of breast (female), unspecified site July 2014   Her case was presented at the [facility] tumor board in July 2014. Recommendations were for chest wall/peripheral lymphatic radiation if metastatic disease was not identified, anti-estrogen therapy only if additional metastatic disease was detected on a PET CT. No plans for adjuvant chemotherapy if metastatic disease was identified was recommended.  . Osteoporosis   . Sciatic pain 2015  . Spinal stenosis      Past Surgical History:  Procedure Laterality Date  . ABDOMINAL HYSTERECTOMY    . BREAST SURGERY Left 04-28-13   left mastectomy, SN biopsy  . EPIDURAL BLOCK INJECTION  2015   x3    Social History   Socioeconomic History  . Marital status: Married    Spouse name: Not on file  . Number of children: Not on file  . Years of education: Not on file  . Highest education level: Not on file  Occupational History  . Not on file  Social Needs  . Financial resource strain: Not on file  . Food insecurity:    Worry: Not on file    Inability: Not on file  . Transportation needs:    Medical: Not on file    Non-medical: Not on file  Tobacco Use  . Smoking status: Never Smoker  . Smokeless tobacco: Never Used  Substance and Sexual Activity  . Alcohol use: No  . Drug use: No  . Sexual activity: Not on file   Lifestyle  . Physical activity:    Days per week: Not on file    Minutes per session: Not on file  . Stress: Not on file  Relationships  . Social connections:    Talks on phone: Not on file    Gets together: Not on file    Attends religious service: Not on file    Active member of club or organization: Not on file    Attends meetings of clubs or organizations: Not on file    Relationship status: Not on file  . Intimate partner violence:    Fear of current or ex partner: Not on file    Emotionally abused: Not on file    Physically abused: Not on file    Forced sexual activity: Not on file  Other Topics Concern  . Not on file  Social History Narrative  . Not on file    Family History  Problem Relation Age of Onset  . Stroke Mother   . Heart disease Father   . Stroke Father   . Parkinson's disease Sister   . Parkinson's disease Brother      Current Outpatient Medications:  .  alendronate (FOSAMAX) 70 MG tablet, TAKE 1 TABLET BY MOUTH EVERY 7 DAYS. TAKE WITH A FULL GLASS OF WATER ON AN EMPTY STOMACH., Disp: 12 tablet, Rfl: 3 .  amoxicillin-clavulanate (AUGMENTIN) 875-125 MG tablet, Take 1 tablet by mouth 2 (two) times daily., Disp: 14 tablet, Rfl: 0 .  azelastine (ASTELIN) 0.1 % nasal spray, Place into both nostrils 2 (two) times daily as needed. Use in each nostril as directed , Disp: , Rfl:  .  b complex vitamins tablet, Take 1 tablet by mouth daily., Disp: , Rfl:  .  ciprofloxacin (CIPRO) 500 MG tablet, Take 1 tablet (500 mg total) by mouth 2 (two) times daily. (Patient not taking: Reported on 07/04/2018), Disp: 14 tablet, Rfl: 0 .  docusate sodium (COLACE) 100 MG capsule, Take 100 mg by mouth daily., Disp: , Rfl:  .  famotidine (PEPCID) 10 MG tablet, Take 10 mg by mouth daily as needed for heartburn or indigestion., Disp: , Rfl:  .  fluticasone (FLONASE) 50 MCG/ACT nasal spray, Place 1 spray into both nostrils daily as needed. , Disp: , Rfl:  .  hydrochlorothiazide  (HYDRODIURIL) 25 MG tablet, TAKE 1 TABLET BY MOUTH EVERY DAY, Disp: 90 tablet, Rfl: 3 .  LORazepam (ATIVAN) 0.5 MG tablet, Take 1 tablet (0.5 mg total) by mouth every 6 (six) hours as needed (Nausea or vomiting). (Patient not taking: Reported on 05/22/2018), Disp: 30 tablet, Rfl: 0 .  magic mouthwash SOLN, Take 5 mLs by mouth 4 (four) times daily as needed for mouth pain. (Patient not taking: Reported on 06/06/2018), Disp: 240 mL, Rfl: 0 .  naproxen sodium (ALEVE) 220 MG tablet, Take 220 mg by mouth daily as needed. , Disp: , Rfl:  .  OLANZapine (ZYPREXA) 5 MG tablet, Take 1 tablet (5 mg total) by mouth at bedtime. (Patient not taking: Reported on 07/25/2018), Disp: 30 tablet, Rfl: 0 .  ondansetron (ZOFRAN) 8 MG tablet, Take 1 tablet (8 mg total) by mouth 2 (two) times daily as needed (Nausea or vomiting). (Patient not taking: Reported on 05/22/2018), Disp: 30 tablet, Rfl: 1 .  potassium chloride (MICRO-K) 10 MEQ CR capsule, TAKE 1 CAPSULE BY MOUTH EVERY DAY., Disp: 90 capsule, Rfl: 3 .  prochlorperazine (COMPAZINE) 10 MG tablet, Take 1 tablet (10 mg total) by mouth every 6 (six) hours as needed (Nausea or vomiting). (Patient not taking: Reported on 05/22/2018), Disp: 30 tablet, Rfl: 1 .  Saline 0.2 % SOLN, , Disp: , Rfl:  .  sertraline (ZOLOFT) 100 MG tablet, TAKE 1 TABLET BY MOUTH EVERY DAY (Patient not taking: Reported on 06/20/2018), Disp: 30 tablet, Rfl: 11 No current facility-administered medications for this visit.   Facility-Administered Medications Ordered in Other Visits:  .  heparin lock flush 100 unit/mL, 500 Units, Intracatheter, Daily PRN, Sindy Guadeloupe, MD .  sodium chloride flush (NS) 0.9 % injection 10 mL, 10 mL, Intracatheter, PRN, Sindy Guadeloupe, MD .  sodium chloride flush (NS) 0.9 % injection 3 mL, 3 mL, Intracatheter, PRN, Sindy Guadeloupe, MD  Physical exam: There were no vitals filed for this visit. Physical Exam  Constitutional: She is oriented to person, place, and time.  Thin  frail elderly lady in no acute distress  HENT:  Head: Normocephalic and atraumatic.  Eyes: Pupils are equal, round, and reactive to light. EOM are normal.  Neck: Normal range of motion.  Cardiovascular: Normal rate, regular rhythm and normal heart sounds.  Pulmonary/Chest: Effort normal and breath sounds normal.  Abdominal: Soft. Bowel  sounds are normal.  Neurological: She is alert and oriented to person, place, and time.  Skin: Skin is warm and dry.     CMP Latest Ref Rng & Units 08/12/2018  Glucose 70 - 99 mg/dL 120(H)  BUN 8 - 23 mg/dL 18  Creatinine 0.44 - 1.00 mg/dL 0.58  Sodium 135 - 145 mmol/L 136  Potassium 3.5 - 5.1 mmol/L 3.7  Chloride 98 - 111 mmol/L 102  CO2 22 - 32 mmol/L 26  Calcium 8.9 - 10.3 mg/dL 9.4  Total Protein 6.5 - 8.1 g/dL 6.1(L)  Total Bilirubin 0.3 - 1.2 mg/dL 0.9  Alkaline Phos 38 - 126 U/L 76  AST 15 - 41 U/L 15  ALT 0 - 44 U/L 16   CBC Latest Ref Rng & Units 08/20/2018  WBC 4.0 - 10.5 K/uL 3.0(L)  Hemoglobin 12.0 - 15.0 g/dL 5.8(L)  Hematocrit 36.0 - 46.0 % 17.5(L)  Platelets 150 - 400 K/uL 41(L)    No images are attached to the encounter.  Dg Abdomen 1 View  Result Date: 08/14/2018 CLINICAL DATA:  Constipation, question impaction EXAM: ABDOMEN - 1 VIEW COMPARISON:  None. FINDINGS: Moderate stool burden in the rectosigmoid colon. No evidence of bowel obstruction, organomegaly or free air. Scoliosis and degenerative changes in the lumbar spine. IMPRESSION: Moderate stool burden in the rectosigmoid colon. No evidence of bowel obstruction Electronically Signed   By: Rolm Baptise M.D.   On: 08/14/2018 10:38     Assessment and plan- Patient is a 84 y.o. female with refractory AML here for on treatment assessment prior to cycle 3-day 1 of Vidaza  1.  Patient initially received 1 cycle of Vidaza along with venetoclax.  However that did not put her in remission even after 2 and she was found to have close to 50% blasts in her bone marrow.  Different  second line options were discussed including continuing Vidaza for palliation versus clinical trial at [facility] versus gemtuzumab versus best supportive care.  Patient chose to proceed with palliative vidaza.    2. She continues to have significant pancytopenia likely secondary to bone marrow involvement from her AML.  She did receive 2 units of PRBC last week and will not require any blood transfusion today.  I will hold off on giving her any platelets today but we will repeat CBC with differential in 2 days time for possible blood and or platelet transfusion.  3.  She will proceed with cycle 3-day 1 of Vidaza today and will get subcutaneous for 7 continuous days excluding Saturday and Sunday.  I will plan to get a repeat peripheral smear review in 2 days time to assess the number of blasts in her peripheral blood which were approximately at 10% last week.  If there is a consistent increase in her peripheral blasts despite vidaza I could mean that it is not working and she will need to explore best supportive care versus going to [facility] for further management.  Patient remains undecided at this time  4.  Patient is significantly neutropenic today and her ANC is 0.3.  I have reviewed neutropenic precautions with her and I will start her on Augmentin for bacterial prophylaxis.  She was previously on Cipro.  However her E. coli that was found in her urine is resistant to ciprofloxacin.  She will also continue Valtrex  5.  Repeat CBC with differential 2 times a week next week and I will see her back in 2 weeks.  6.  I also  discussed CODE STATUS and treatment preferences with her.  I have given her a copy of most form for her to review.  She wants to think about it and I will explore this in more detail during my next visit.  I will also refer her to palliative care to discuss her overall goals of care   Total face to face encounter time for this patient visit was 40 min. >50% of the time was  spent in counseling  and coordination of care.     Visit Diagnosis 1. Encounter for antineoplastic chemotherapy   2. Acute myeloid leukemia not having achieved remission ([place])   3. Goals of care, counseling/discussion      Dr. Randa Evens, MD, MPH [facility] [phone] 08/26/2018 12:16 PM

## 2018-08-26 NOTE — Progress Notes (Signed)
No new changes noted today / No constipation at this time

## 2018-08-26 NOTE — Progress Notes (Signed)
Okay to proceed with Vidaza with WBC 1.9, platelets 16, and ANC of 0.3, and no blood or platelets needed at this time per [facility] per Dr. Janese Banks.

## 2018-08-27 ENCOUNTER — Other Ambulatory Visit: Payer: Self-pay | Admitting: *Deleted

## 2018-08-27 ENCOUNTER — Inpatient Hospital Stay: Payer: Medicare Other

## 2018-08-27 DIAGNOSIS — Z853 Personal history of malignant neoplasm of breast: Secondary | ICD-10-CM | POA: Diagnosis not present

## 2018-08-27 DIAGNOSIS — D709 Neutropenia, unspecified: Secondary | ICD-10-CM | POA: Diagnosis not present

## 2018-08-27 DIAGNOSIS — C9200 Acute myeloblastic leukemia, not having achieved remission: Secondary | ICD-10-CM

## 2018-08-27 DIAGNOSIS — D61818 Other pancytopenia: Secondary | ICD-10-CM | POA: Diagnosis not present

## 2018-08-27 DIAGNOSIS — Z9012 Acquired absence of left breast and nipple: Secondary | ICD-10-CM | POA: Diagnosis not present

## 2018-08-27 MED ORDER — ONDANSETRON HCL 4 MG PO TABS
8.0000 mg | ORAL_TABLET | Freq: Once | ORAL | Status: AC
  Administered 2018-08-27: 8 mg via ORAL
  Filled 2018-08-27: qty 2

## 2018-08-27 MED ORDER — AZACITIDINE CHEMO SQ INJECTION
75.0000 mg/m2 | Freq: Once | INTRAMUSCULAR | Status: AC
  Administered 2018-08-27: 122.5 mg via SUBCUTANEOUS
  Filled 2018-08-27: qty 4.9

## 2018-08-28 ENCOUNTER — Inpatient Hospital Stay: Payer: Medicare Other

## 2018-08-28 ENCOUNTER — Other Ambulatory Visit: Payer: Self-pay | Admitting: *Deleted

## 2018-08-28 ENCOUNTER — Other Ambulatory Visit: Payer: Self-pay | Admitting: Oncology

## 2018-08-28 VITALS — BP 121/66 | HR 66 | Temp 97.7°F | Resp 18

## 2018-08-28 DIAGNOSIS — C9200 Acute myeloblastic leukemia, not having achieved remission: Secondary | ICD-10-CM | POA: Diagnosis not present

## 2018-08-28 DIAGNOSIS — D649 Anemia, unspecified: Secondary | ICD-10-CM

## 2018-08-28 DIAGNOSIS — D696 Thrombocytopenia, unspecified: Secondary | ICD-10-CM

## 2018-08-28 DIAGNOSIS — Z9012 Acquired absence of left breast and nipple: Secondary | ICD-10-CM | POA: Diagnosis not present

## 2018-08-28 DIAGNOSIS — Z853 Personal history of malignant neoplasm of breast: Secondary | ICD-10-CM | POA: Diagnosis not present

## 2018-08-28 DIAGNOSIS — D61818 Other pancytopenia: Secondary | ICD-10-CM | POA: Diagnosis not present

## 2018-08-28 DIAGNOSIS — D709 Neutropenia, unspecified: Secondary | ICD-10-CM | POA: Diagnosis not present

## 2018-08-28 LAB — CBC WITH DIFFERENTIAL/PLATELET
Abs Immature Granulocytes: 0.01 10*3/uL (ref 0.00–0.07)
BASOS PCT: 0 %
Basophils Absolute: 0 10*3/uL (ref 0.0–0.1)
EOS ABS: 0 10*3/uL (ref 0.0–0.5)
Eosinophils Relative: 0 %
HCT: 23.1 % — ABNORMAL LOW (ref 36.0–46.0)
Hemoglobin: 7.6 g/dL — ABNORMAL LOW (ref 12.0–15.0)
Immature Granulocytes: 1 %
Lymphocytes Relative: 64 %
Lymphs Abs: 1 10*3/uL (ref 0.7–4.0)
MCH: 29 pg (ref 26.0–34.0)
MCHC: 32.9 g/dL (ref 30.0–36.0)
MCV: 88.2 fL (ref 80.0–100.0)
MONO ABS: 0.2 10*3/uL (ref 0.1–1.0)
MONOS PCT: 14 %
Neutro Abs: 0.3 10*3/uL — ABNORMAL LOW (ref 1.7–7.7)
Neutrophils Relative %: 21 %
Platelets: 9 10*3/uL — CL (ref 150–400)
RBC: 2.62 MIL/uL — ABNORMAL LOW (ref 3.87–5.11)
RDW: 14.4 % (ref 11.5–15.5)
WBC: 1.5 10*3/uL — ABNORMAL LOW (ref 4.0–10.5)
nRBC: 0 % (ref 0.0–0.2)

## 2018-08-28 LAB — PREPARE RBC (CROSSMATCH)

## 2018-08-28 MED ORDER — ONDANSETRON HCL 4 MG PO TABS
8.0000 mg | ORAL_TABLET | Freq: Once | ORAL | Status: AC
  Administered 2018-08-28: 8 mg via ORAL
  Filled 2018-08-28: qty 2

## 2018-08-28 MED ORDER — ACETAMINOPHEN 325 MG PO TABS
650.0000 mg | ORAL_TABLET | Freq: Once | ORAL | Status: AC
  Administered 2018-08-28: 650 mg via ORAL
  Filled 2018-08-28: qty 2

## 2018-08-28 MED ORDER — AZACITIDINE CHEMO SQ INJECTION
75.0000 mg/m2 | Freq: Once | INTRAMUSCULAR | Status: AC
  Administered 2018-08-28: 122.5 mg via SUBCUTANEOUS
  Filled 2018-08-28: qty 4.9

## 2018-08-28 MED ORDER — SODIUM CHLORIDE 0.9% IV SOLUTION
250.0000 mL | Freq: Once | INTRAVENOUS | Status: AC
  Administered 2018-08-28: 250 mL via INTRAVENOUS
  Filled 2018-08-28: qty 250

## 2018-08-28 NOTE — Progress Notes (Signed)
Per Lanetta Inch., RN pt will get Vidaza today and 1 unit of platelets, possible blood.

## 2018-08-29 ENCOUNTER — Inpatient Hospital Stay: Payer: Medicare Other

## 2018-08-29 VITALS — BP 130/70 | HR 76 | Temp 97.8°F | Resp 18

## 2018-08-29 DIAGNOSIS — D61818 Other pancytopenia: Secondary | ICD-10-CM | POA: Diagnosis not present

## 2018-08-29 DIAGNOSIS — C9200 Acute myeloblastic leukemia, not having achieved remission: Secondary | ICD-10-CM

## 2018-08-29 DIAGNOSIS — Z9012 Acquired absence of left breast and nipple: Secondary | ICD-10-CM | POA: Diagnosis not present

## 2018-08-29 DIAGNOSIS — Z853 Personal history of malignant neoplasm of breast: Secondary | ICD-10-CM | POA: Diagnosis not present

## 2018-08-29 DIAGNOSIS — D709 Neutropenia, unspecified: Secondary | ICD-10-CM | POA: Diagnosis not present

## 2018-08-29 LAB — TYPE AND SCREEN
ABO/RH(D): A POS
ANTIBODY SCREEN: NEGATIVE
Unit division: 0

## 2018-08-29 LAB — BPAM RBC
Blood Product Expiration Date: 201912012359
ISSUE DATE / TIME: 201911141306
Unit Type and Rh: 6200

## 2018-08-29 LAB — BPAM PLATELET PHERESIS
BLOOD PRODUCT EXPIRATION DATE: 201911162359
ISSUE DATE / TIME: 201911141210
UNIT TYPE AND RH: 5100

## 2018-08-29 LAB — PREPARE PLATELET PHERESIS: UNIT DIVISION: 0

## 2018-08-29 MED ORDER — AZACITIDINE CHEMO SQ INJECTION
75.0000 mg/m2 | Freq: Once | INTRAMUSCULAR | Status: AC
  Administered 2018-08-29: 122.5 mg via SUBCUTANEOUS
  Filled 2018-08-29: qty 4.9

## 2018-08-29 MED ORDER — ONDANSETRON HCL 4 MG PO TABS
8.0000 mg | ORAL_TABLET | Freq: Once | ORAL | Status: AC
  Administered 2018-08-29: 8 mg via ORAL
  Filled 2018-08-29: qty 2

## 2018-09-01 ENCOUNTER — Inpatient Hospital Stay: Payer: Medicare Other

## 2018-09-01 DIAGNOSIS — D709 Neutropenia, unspecified: Secondary | ICD-10-CM | POA: Diagnosis not present

## 2018-09-01 DIAGNOSIS — C9200 Acute myeloblastic leukemia, not having achieved remission: Secondary | ICD-10-CM | POA: Diagnosis not present

## 2018-09-01 DIAGNOSIS — Z9012 Acquired absence of left breast and nipple: Secondary | ICD-10-CM | POA: Diagnosis not present

## 2018-09-01 DIAGNOSIS — D61818 Other pancytopenia: Secondary | ICD-10-CM | POA: Diagnosis not present

## 2018-09-01 DIAGNOSIS — Z853 Personal history of malignant neoplasm of breast: Secondary | ICD-10-CM | POA: Diagnosis not present

## 2018-09-01 LAB — CBC WITH DIFFERENTIAL/PLATELET
BASOS ABS: 0 10*3/uL (ref 0.0–0.1)
BASOS PCT: 1 %
BLASTS: 6 %
EOS ABS: 0 10*3/uL (ref 0.0–0.5)
Eosinophils Relative: 3 %
HCT: 27.2 % — ABNORMAL LOW (ref 36.0–46.0)
HEMOGLOBIN: 9 g/dL — AB (ref 12.0–15.0)
Lymphocytes Relative: 57 %
Lymphs Abs: 0.9 10*3/uL (ref 0.7–4.0)
MCH: 28.6 pg (ref 26.0–34.0)
MCHC: 33.1 g/dL (ref 30.0–36.0)
MCV: 86.3 fL (ref 80.0–100.0)
MONOS PCT: 20 %
Monocytes Absolute: 0.3 10*3/uL (ref 0.1–1.0)
NEUTROS ABS: 0.2 10*3/uL — AB (ref 1.7–7.7)
NEUTROS PCT: 13 %
NRBC: 0 % (ref 0.0–0.2)
PLATELETS: 23 10*3/uL — AB (ref 150–400)
RBC: 3.15 MIL/uL — ABNORMAL LOW (ref 3.87–5.11)
RDW: 13.6 % (ref 11.5–15.5)
WBC: 1.6 10*3/uL — ABNORMAL LOW (ref 4.0–10.5)

## 2018-09-01 LAB — SAMPLE TO BLOOD BANK

## 2018-09-01 LAB — PATHOLOGIST SMEAR REVIEW

## 2018-09-01 MED ORDER — ONDANSETRON HCL 4 MG PO TABS
8.0000 mg | ORAL_TABLET | Freq: Once | ORAL | Status: AC
  Administered 2018-09-01: 8 mg via ORAL
  Filled 2018-09-01: qty 2

## 2018-09-01 MED ORDER — AZACITIDINE CHEMO SQ INJECTION
75.0000 mg/m2 | Freq: Once | INTRAMUSCULAR | Status: AC
  Administered 2018-09-01: 122.5 mg via SUBCUTANEOUS
  Filled 2018-09-01: qty 4.9

## 2018-09-02 ENCOUNTER — Inpatient Hospital Stay: Payer: Medicare Other

## 2018-09-02 VITALS — BP 123/61 | HR 74 | Temp 98.7°F | Resp 16

## 2018-09-02 DIAGNOSIS — D709 Neutropenia, unspecified: Secondary | ICD-10-CM | POA: Diagnosis not present

## 2018-09-02 DIAGNOSIS — C9200 Acute myeloblastic leukemia, not having achieved remission: Secondary | ICD-10-CM

## 2018-09-02 DIAGNOSIS — Z853 Personal history of malignant neoplasm of breast: Secondary | ICD-10-CM | POA: Diagnosis not present

## 2018-09-02 DIAGNOSIS — Z9012 Acquired absence of left breast and nipple: Secondary | ICD-10-CM | POA: Diagnosis not present

## 2018-09-02 DIAGNOSIS — D61818 Other pancytopenia: Secondary | ICD-10-CM | POA: Diagnosis not present

## 2018-09-02 MED ORDER — AZACITIDINE CHEMO SQ INJECTION
75.0000 mg/m2 | Freq: Once | INTRAMUSCULAR | Status: AC
  Administered 2018-09-02: 122.5 mg via SUBCUTANEOUS
  Filled 2018-09-02: qty 4.9

## 2018-09-02 MED ORDER — ONDANSETRON HCL 4 MG PO TABS
8.0000 mg | ORAL_TABLET | Freq: Once | ORAL | Status: AC
  Administered 2018-09-02: 8 mg via ORAL
  Filled 2018-09-02: qty 2

## 2018-09-03 ENCOUNTER — Inpatient Hospital Stay: Payer: Medicare Other

## 2018-09-03 VITALS — BP 119/69 | HR 71 | Temp 96.2°F | Resp 20

## 2018-09-03 DIAGNOSIS — C9200 Acute myeloblastic leukemia, not having achieved remission: Secondary | ICD-10-CM | POA: Diagnosis not present

## 2018-09-03 DIAGNOSIS — Z9012 Acquired absence of left breast and nipple: Secondary | ICD-10-CM | POA: Diagnosis not present

## 2018-09-03 DIAGNOSIS — Z853 Personal history of malignant neoplasm of breast: Secondary | ICD-10-CM | POA: Diagnosis not present

## 2018-09-03 DIAGNOSIS — D61818 Other pancytopenia: Secondary | ICD-10-CM | POA: Diagnosis not present

## 2018-09-03 DIAGNOSIS — D709 Neutropenia, unspecified: Secondary | ICD-10-CM | POA: Diagnosis not present

## 2018-09-03 MED ORDER — AZACITIDINE CHEMO SQ INJECTION
75.0000 mg/m2 | Freq: Once | INTRAMUSCULAR | Status: AC
  Administered 2018-09-03: 122.5 mg via SUBCUTANEOUS
  Filled 2018-09-03: qty 4.9

## 2018-09-03 MED ORDER — ONDANSETRON HCL 4 MG PO TABS
8.0000 mg | ORAL_TABLET | Freq: Once | ORAL | Status: AC
  Administered 2018-09-03: 8 mg via ORAL
  Filled 2018-09-03: qty 2

## 2018-09-05 ENCOUNTER — Other Ambulatory Visit: Payer: Self-pay | Admitting: Oncology

## 2018-09-05 ENCOUNTER — Inpatient Hospital Stay: Payer: Medicare Other

## 2018-09-05 DIAGNOSIS — Z9012 Acquired absence of left breast and nipple: Secondary | ICD-10-CM | POA: Diagnosis not present

## 2018-09-05 DIAGNOSIS — C9200 Acute myeloblastic leukemia, not having achieved remission: Secondary | ICD-10-CM | POA: Diagnosis not present

## 2018-09-05 DIAGNOSIS — D61818 Other pancytopenia: Secondary | ICD-10-CM | POA: Diagnosis not present

## 2018-09-05 DIAGNOSIS — D696 Thrombocytopenia, unspecified: Secondary | ICD-10-CM

## 2018-09-05 DIAGNOSIS — Z853 Personal history of malignant neoplasm of breast: Secondary | ICD-10-CM | POA: Diagnosis not present

## 2018-09-05 DIAGNOSIS — D709 Neutropenia, unspecified: Secondary | ICD-10-CM | POA: Diagnosis not present

## 2018-09-05 LAB — CBC WITH DIFFERENTIAL/PLATELET
ABS IMMATURE GRANULOCYTES: 0.12 10*3/uL — AB (ref 0.00–0.07)
BASOS PCT: 0 %
Basophils Absolute: 0 10*3/uL (ref 0.0–0.1)
EOS PCT: 0 %
Eosinophils Absolute: 0 10*3/uL (ref 0.0–0.5)
HCT: 24.1 % — ABNORMAL LOW (ref 36.0–46.0)
HEMOGLOBIN: 8.1 g/dL — AB (ref 12.0–15.0)
IMMATURE GRANULOCYTES: 8 %
LYMPHS PCT: 66 %
Lymphs Abs: 1 10*3/uL (ref 0.7–4.0)
MCH: 29 pg (ref 26.0–34.0)
MCHC: 33.6 g/dL (ref 30.0–36.0)
MCV: 86.4 fL (ref 80.0–100.0)
MONO ABS: 0.3 10*3/uL (ref 0.1–1.0)
MONOS PCT: 21 %
Neutro Abs: 0.1 10*3/uL — ABNORMAL LOW (ref 1.7–7.7)
Neutrophils Relative %: 5 %
PLATELETS: 6 10*3/uL — AB (ref 150–400)
RBC: 2.79 MIL/uL — ABNORMAL LOW (ref 3.87–5.11)
RDW: 13.3 % (ref 11.5–15.5)
WBC: 1.5 10*3/uL — AB (ref 4.0–10.5)
nRBC: 0 % (ref 0.0–0.2)

## 2018-09-05 LAB — SAMPLE TO BLOOD BANK

## 2018-09-05 MED ORDER — ACETAMINOPHEN 325 MG PO TABS
650.0000 mg | ORAL_TABLET | Freq: Once | ORAL | Status: AC
  Administered 2018-09-05: 650 mg via ORAL
  Filled 2018-09-05: qty 2

## 2018-09-05 MED ORDER — SODIUM CHLORIDE 0.9% IV SOLUTION
250.0000 mL | Freq: Once | INTRAVENOUS | Status: AC
  Administered 2018-09-05: 250 mL via INTRAVENOUS
  Filled 2018-09-05: qty 250

## 2018-09-06 LAB — PREPARE PLATELET PHERESIS: UNIT DIVISION: 0

## 2018-09-06 LAB — BPAM PLATELET PHERESIS
Blood Product Expiration Date: 201911232359
ISSUE DATE / TIME: 201911221214
UNIT TYPE AND RH: 6200

## 2018-09-09 ENCOUNTER — Inpatient Hospital Stay: Payer: Medicare Other

## 2018-09-09 ENCOUNTER — Inpatient Hospital Stay (HOSPITAL_BASED_OUTPATIENT_CLINIC_OR_DEPARTMENT_OTHER): Payer: Medicare Other | Admitting: Hospice and Palliative Medicine

## 2018-09-09 ENCOUNTER — Other Ambulatory Visit: Payer: Self-pay | Admitting: *Deleted

## 2018-09-09 ENCOUNTER — Other Ambulatory Visit: Payer: Self-pay

## 2018-09-09 ENCOUNTER — Inpatient Hospital Stay (HOSPITAL_BASED_OUTPATIENT_CLINIC_OR_DEPARTMENT_OTHER): Payer: Medicare Other | Admitting: Oncology

## 2018-09-09 ENCOUNTER — Encounter: Payer: Self-pay | Admitting: Oncology

## 2018-09-09 VITALS — BP 118/60 | HR 78 | Temp 98.4°F | Resp 16 | Ht 61.0 in | Wt 122.3 lb

## 2018-09-09 DIAGNOSIS — C9200 Acute myeloblastic leukemia, not having achieved remission: Secondary | ICD-10-CM

## 2018-09-09 DIAGNOSIS — Z853 Personal history of malignant neoplasm of breast: Secondary | ICD-10-CM

## 2018-09-09 DIAGNOSIS — F329 Major depressive disorder, single episode, unspecified: Secondary | ICD-10-CM

## 2018-09-09 DIAGNOSIS — D61818 Other pancytopenia: Secondary | ICD-10-CM | POA: Diagnosis not present

## 2018-09-09 DIAGNOSIS — Z515 Encounter for palliative care: Secondary | ICD-10-CM

## 2018-09-09 DIAGNOSIS — F32A Depression, unspecified: Secondary | ICD-10-CM

## 2018-09-09 DIAGNOSIS — Z9012 Acquired absence of left breast and nipple: Secondary | ICD-10-CM

## 2018-09-09 DIAGNOSIS — D709 Neutropenia, unspecified: Secondary | ICD-10-CM | POA: Diagnosis not present

## 2018-09-09 LAB — PREPARE RBC (CROSSMATCH)

## 2018-09-09 LAB — COMPREHENSIVE METABOLIC PANEL
ALT: 13 U/L (ref 0–44)
AST: 13 U/L — AB (ref 15–41)
Albumin: 3.3 g/dL — ABNORMAL LOW (ref 3.5–5.0)
Alkaline Phosphatase: 94 U/L (ref 38–126)
Anion gap: 6 (ref 5–15)
BUN: 22 mg/dL (ref 8–23)
CHLORIDE: 103 mmol/L (ref 98–111)
CO2: 27 mmol/L (ref 22–32)
Calcium: 9.4 mg/dL (ref 8.9–10.3)
Creatinine, Ser: 0.5 mg/dL (ref 0.44–1.00)
Glucose, Bld: 139 mg/dL — ABNORMAL HIGH (ref 70–99)
POTASSIUM: 3.4 mmol/L — AB (ref 3.5–5.1)
Sodium: 136 mmol/L (ref 135–145)
Total Bilirubin: 0.6 mg/dL (ref 0.3–1.2)
Total Protein: 5.9 g/dL — ABNORMAL LOW (ref 6.5–8.1)

## 2018-09-09 LAB — DIFFERENTIAL
Abs Immature Granulocytes: 0.05 10*3/uL (ref 0.00–0.07)
Basophils Absolute: 0 10*3/uL (ref 0.0–0.1)
Basophils Relative: 0 %
Eosinophils Absolute: 0 10*3/uL (ref 0.0–0.5)
Eosinophils Relative: 0 %
IMMATURE GRANULOCYTES: 4 %
LYMPHS PCT: 65 %
Lymphs Abs: 0.7 10*3/uL (ref 0.7–4.0)
Monocytes Absolute: 0.1 10*3/uL (ref 0.1–1.0)
Monocytes Relative: 12 %
NEUTROS ABS: 0.2 10*3/uL — AB (ref 1.7–7.7)
Neutrophils Relative %: 19 %

## 2018-09-09 LAB — CBC WITH DIFFERENTIAL/PLATELET
HEMATOCRIT: 22.1 % — AB (ref 36.0–46.0)
HEMOGLOBIN: 7.3 g/dL — AB (ref 12.0–15.0)
MCH: 29 pg (ref 26.0–34.0)
MCHC: 33 g/dL (ref 30.0–36.0)
MCV: 87.7 fL (ref 80.0–100.0)
NRBC: 0 % (ref 0.0–0.2)
Platelets: 18 10*3/uL — CL (ref 150–400)
RBC: 2.52 MIL/uL — AB (ref 3.87–5.11)
RDW: 13.3 % (ref 11.5–15.5)
WBC: 1.1 10*3/uL — AB (ref 4.0–10.5)

## 2018-09-09 LAB — SAMPLE TO BLOOD BANK

## 2018-09-09 MED ORDER — SERTRALINE HCL 50 MG PO TABS: 50.0000 mg | ORAL_TABLET | Freq: Every day | ORAL | 5 refills | Status: AC

## 2018-09-09 MED ORDER — ACETAMINOPHEN 325 MG PO TABS
650.0000 mg | ORAL_TABLET | Freq: Once | ORAL | Status: AC
  Administered 2018-09-09: 650 mg via ORAL
  Filled 2018-09-09: qty 2

## 2018-09-09 MED ORDER — SODIUM CHLORIDE 0.9% IV SOLUTION
250.0000 mL | Freq: Once | INTRAVENOUS | Status: AC
  Administered 2018-09-09: 250 mL via INTRAVENOUS
  Filled 2018-09-09: qty 250

## 2018-09-09 NOTE — Progress Notes (Signed)
Patient here for pre treatment check. See follow up note, she is also complaining of sore in left side of mouth.

## 2018-09-09 NOTE — Progress Notes (Signed)
[place]  Telephone:([phone] [phone]   Name: Crystal Carpenter Date: 09/09/2018 MRN: [id number]  DOB: 1924/01/12  Patient Care Team: Jerrol Banana., MD as PCP - General (Family Medicine) Bary Castilla, Forest Gleason, MD as Consulting Physician (General Surgery) Idelle Leech, [place] as Consulting Physician (Optometry) Sindy Guadeloupe, MD as Medical Oncologist (Medical Oncology)    REASON FOR CONSULTATION: Palliative Care consult requested for this 84 y.o. 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 Palliative care was asked to assist with establishing goals.   SOCIAL HISTORY:    Patient is married and lives at home with her husband. She has two daughters. 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  ADVANCE DIRECTIVES:  On file  CODE STATUS: DNR  PAST MEDICAL HISTORY: Past Medical History:  Diagnosis Date  . Acute myeloid leukemia ([place])   . Arthritis   . [facility]) July 2014   T2,N2a, Modified radical mastectomy. 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 COPD (chronic obstructive pulmonary disease) ([place])   . Degenerative disc disease, lumbar   . Hypertension   . Malignant neoplasm of breast (female), unspecified site July 2014   Her case was presented at the [facility] tumor board in July 2014. Recommendations were for chest wall/peripheral lymphatic radiation if metastatic disease was not identified, anti-estrogen therapy only if additional metastatic disease was detected on a PET CT. No plans for adjuvant chemotherapy if metastatic disease was identified was recommended.  . Osteoporosis   . Sciatic pain 2015  . Spinal stenosis     PAST SURGICAL HISTORY:  Past Surgical History:  Procedure Laterality Date  . ABDOMINAL HYSTERECTOMY    . BREAST SURGERY Left 04-28-13     left mastectomy, SN biopsy  . EPIDURAL BLOCK INJECTION  2015   x3    HEMATOLOGY/ONCOLOGY HISTORY:  Oncology History   Patient presented as 84 year old female with past medical history significant for breast cancer, hypercholesterolemia, hypertension, and others.  She was referred to [facility] for anemia.  CBC from 09/20/2017 showed wbc 5.6, H&H 9.6/29.8, platelet count 105.  TSH was 1.11 (normal).  CMP was within normal limits.  Serum iron was normal at 103.  B12 levels were elevated at 1905 and folate was normal at greater than 24.  Stool FOBT was negative.  Patient lives with her 38 year old husband and is independent of her ADLs and IADLs.  She still drives.  She reported fatigue but overall feeling well at that time.  Denied blood in her stool or urine.  Results of blood work from 10/21/2017 were as follows: CBC showed white count of 10.6, H&H of 10.1/31.9 with a platelet count of 210. Iron studies, haptoglobin and reticulocyte count was normal. Multiple myeloma panel showed no monoclonal protein and IFE was normal  Patients hb dropped down to 6.4 in may 2019. She received 2 units of prbc transfusion. She also had repeat anemia work up which was unremarkable. She then had progressive leucocytosis with wbc in 20's and left shift. She underwent bone marrow biopsy on 04/03/18. Noted to have 19% myeloid blasts in the marrow and 20% blasts in peripheral blood consistent with acute myeloid leukemia  NGS panel showed NF1 but no other mutations. She was seen by Dr. Janene Madeira at [facility]. She was started on venetoclax. She is receiving [place] vidaza 7 days  a month. Initiated on 04/23/18  Bone marrow biopsy on day 20 post vidaza still showed 19% blasts.       AML (acute myeloblastic leukemia) ([place])   04/09/2018 Initial Diagnosis    AML (acute myeloblastic leukemia) ([place])    04/09/2018 -  Chemotherapy    The patient had azaCITIDine (VIDAZA) chemo injection 122.5 mg, 75 mg/m2 = 122.5 mg, Subcutaneous,  Once, 3 of  4 cycles Administration: 122.5 mg (05/21/2018), 122.5 mg (05/22/2018), 122.5 mg (07/29/2018), 122.5 mg (05/23/2018), 122.5 mg (05/26/2018), 122.5 mg (05/27/2018), 122.5 mg (05/28/2018), 122.5 mg (05/29/2018), 122.5 mg (07/30/2018), 122.5 mg (07/31/2018), 122.5 mg (08/01/2018), 122.5 mg (08/04/2018), 122.5 mg (08/05/2018), 122.5 mg (08/06/2018), 122.5 mg (08/26/2018), 122.5 mg (08/27/2018), 122.5 mg (08/28/2018), 122.5 mg (08/29/2018), 122.5 mg (09/01/2018), 122.5 mg (09/02/2018)  for chemotherapy treatment.      ALLERGIES:  is allergic to tape; gabapentin; sulfa antibiotics; allopurinol; and shellfish allergy.  MEDICATIONS:  Current Outpatient Medications  Medication Sig Dispense Refill  . alendronate (FOSAMAX) 70 MG tablet TAKE 1 TABLET BY MOUTH EVERY 7 DAYS. TAKE WITH A FULL GLASS OF WATER ON AN EMPTY STOMACH. 12 tablet 3  . amoxicillin-clavulanate (AUGMENTIN) 875-125 MG tablet Take 1 tablet by mouth 2 (two) times daily. 60 tablet 0  . azelastine (ASTELIN) 0.1 % nasal spray Place into both nostrils 2 (two) times daily as needed. Use in each nostril as directed     . b complex vitamins tablet Take 1 tablet by mouth daily.    . ciprofloxacin (CIPRO) 500 MG tablet Take 1 tablet (500 mg total) by mouth 2 (two) times daily. 14 tablet 0  . docusate sodium (COLACE) 100 MG capsule Take 100 mg by mouth daily.    . famotidine (PEPCID) 10 MG tablet Take 10 mg by mouth daily as needed for heartburn or indigestion.    . fluticasone (FLONASE) 50 MCG/ACT nasal spray Place 1 spray into both nostrils daily as needed.     . hydrochlorothiazide (HYDRODIURIL) 25 MG tablet TAKE 1 TABLET BY MOUTH EVERY DAY 90 tablet 3  . LORazepam (ATIVAN) 0.5 MG tablet Take 1 tablet (0.5 mg total) by mouth every 6 (six) hours as needed (Nausea or vomiting). 30 tablet 0  . magic mouthwash SOLN Take 5 mLs by mouth 4 (four) times daily as needed for mouth pain. 240 mL 0  . naproxen sodium (ALEVE) 220 MG tablet Take 220 mg by mouth daily as  needed.     [name] OLANZapine (ZYPREXA) 5 MG tablet Take 1 tablet (5 mg total) by mouth at bedtime. 30 tablet 0  . ondansetron (ZOFRAN) 8 MG tablet Take 1 tablet (8 mg total) by mouth 2 (two) times daily as needed (Nausea or vomiting). 30 tablet 1  . polyethylene glycol (MIRALAX / GLYCOLAX) packet Take 17 g by mouth daily.    . potassium chloride (MICRO-K) 10 MEQ CR capsule TAKE 1 CAPSULE BY MOUTH EVERY DAY. 90 capsule 3  . prochlorperazine (COMPAZINE) 10 MG tablet Take 1 tablet (10 mg total) by mouth every 6 (six) hours as needed (Nausea or vomiting). 30 tablet 1  . Saline 0.2 % SOLN     . sertraline (ZOLOFT) 50 MG tablet Take 1 tablet (50 mg total) by mouth daily. 30 tablet 5  . valACYclovir (VALTREX) 500 MG tablet Take 1 tablet (500 mg total) by mouth daily. 30 tablet 3   No current facility-administered medications for this visit.    Facility-Administered Medications Ordered in Other Visits  Medication Dose Route  Frequency Provider Last Rate Last Dose  . heparin lock flush 100 unit/mL  500 Units Intracatheter Daily PRN Sindy Guadeloupe, MD      . sodium chloride flush (NS) 0.9 % injection 10 mL  10 mL Intracatheter PRN Sindy Guadeloupe, MD      . sodium chloride flush (NS) 0.9 % injection 3 mL  3 mL Intracatheter PRN Sindy Guadeloupe, MD        VITAL SIGNS: There were no vitals taken for this visit. There were no vitals filed for this visit.  Estimated body mass index is 23.11 kg/m as calculated from the following:   Height as of an earlier encounter on 09/09/18: '5\' 1"'$  (1.549 m).   Weight as of an earlier encounter on 09/09/18: 122 lb 4.8 oz (55.5 kg).  LABS: CBC:    Component Value Date/Time   WBC 1.1 (LL) 09/09/2018 0845   HGB 7.3 (L) 09/09/2018 0845   HGB 11.8 01/23/2017 1639   HCT 22.1 (L) 09/09/2018 0845   HCT 37.2 01/23/2017 1639   PLT 18 (LL) 09/09/2018 0845   PLT 97 (LL) 01/23/2017 1639   MCV 87.7 09/09/2018 0845   MCV 79 01/23/2017 1639   MCV 91 04/09/2013 1305   NEUTROABS  0.2 (L) 09/09/2018 0932   NEUTROABS 2.0 01/23/2017 1639   NEUTROABS 4.1 04/09/2013 1305   LYMPHSABS 0.7 09/09/2018 0932   LYMPHSABS 3.7 (H) 01/23/2017 1639   LYMPHSABS 2.2 04/09/2013 1305   MONOABS 0.1 09/09/2018 0932   MONOABS 0.5 04/09/2013 1305   EOSABS 0.0 09/09/2018 0932   EOSABS 0.1 01/23/2017 1639   EOSABS 0.0 04/09/2013 1305   BASOSABS 0.0 09/09/2018 0932   BASOSABS 0.0 01/23/2017 1639   BASOSABS 0.1 04/09/2013 1305   Comprehensive Metabolic Panel:    Component Value Date/Time   NA 136 09/09/2018 0845   NA 141 01/23/2017 1639   NA 138 04/09/2013 1305   K 3.4 (L) 09/09/2018 0845   K 4.1 04/09/2013 1305   CL 103 09/09/2018 0845   CL 102 04/09/2013 1305   CO2 27 09/09/2018 0845   CO2 31 04/09/2013 1305   BUN 22 09/09/2018 0845   BUN 17 01/23/2017 1639   BUN 17 04/09/2013 1305   CREATININE 0.50 09/09/2018 0845   CREATININE 0.65 09/20/2017 1129   GLUCOSE 139 (H) 09/09/2018 0845   GLUCOSE 119 (H) 04/09/2013 1305   CALCIUM 9.4 09/09/2018 0845   CALCIUM 9.7 04/09/2013 1305   AST 13 (L) 09/09/2018 0845   AST 23 04/09/2013 1305   ALT 13 09/09/2018 0845   ALT 23 04/09/2013 1305   ALKPHOS 94 09/09/2018 0845   ALKPHOS 85 04/09/2013 1305   BILITOT 0.6 09/09/2018 0845   BILITOT 0.6 07/06/2016 1129   BILITOT 0.5 04/09/2013 1305   PROT 5.9 (L) 09/09/2018 0845   PROT 6.7 07/06/2016 1129   PROT 7.3 04/09/2013 1305   ALBUMIN 3.3 (L) 09/09/2018 0845   ALBUMIN 4.5 01/23/2017 1639   ALBUMIN 4.1 04/09/2013 1305    RADIOGRAPHIC STUDIES: Dg Abdomen 1 View  Result Date: 08/14/2018 CLINICAL DATA:  Constipation, question impaction EXAM: ABDOMEN - 1 VIEW COMPARISON:  None. FINDINGS: Moderate stool burden in the rectosigmoid colon. No evidence of bowel obstruction, organomegaly or free air. Scoliosis and degenerative changes in the lumbar spine. IMPRESSION: Moderate stool burden in the rectosigmoid colon. No evidence of bowel obstruction Electronically Signed   By: Rolm Baptise M.D.    On: 08/14/2018 10:38    PERFORMANCE STATUS (  ECOG) : 2 - Symptomatic, <50% confined to bed  Review of Systems As noted above. Otherwise, a complete review of systems is negative.  Physical Exam General: NAD, frail appearing, thin, sitting in chair Pulmonary: unlabored Extremities: no edema Skin: no rashes Neurological: Weakness but otherwise nonfocal  IMPRESSION: I met today with patient and her daughter in the clinic.  Patient describes general functional decline with increasing fatigue over the past months.  She also has reduced oral intake, generally eating less than 50% of meals.  10 pound weight loss noted over the past 3 months.  We discussed her symptoms as sequelae associated with her advanced AML.  We discussed options for high-protein, high-calorie meals.  She is drinking a boost supplement intermittently.  I suggested that she drink oral supplements twice daily, routinely.  I offered to refer her to our dietitian but patient wanted to hold off for now.  Patient endorses some depressive symptoms and feeling down.  She was previously treated with Zoloft 100 mg daily but says she stopped taking this some months back.  I called her pharmacy and Zoloft was last dispensed in July 2019.  Patient agreeable to restarting Zoloft.  However, will restart at a lower dose as it is been sometime since patient last took this medication.  At baseline, patient lives at home with her husband.  She still functionally independent and able to ambulate with use of a walker.  She still drives.  Her daughter describes patient as being "fiercely independent."  It is clear in talking her that patient's quality of life is tied to her ability to perform self-care, stay at home, and shop.  We discussed advance care planning.  Patient daughter is her healthcare power of attorney.  Patient brought with her the living will and we will scan this into our system.  Patient says clearly that she would not want to be  resuscitated or have her life prolonged artificially on machines.  She would be okay with short-term hospitalization if there was something that could be treated.  However, she would want to die in her own home.  We discussed the future option of hospice involvement as a way to honor her wish of being comfortable at home.  Patient says she has started planning for the funeral.   I completed a MOST form today. The patient and family outlined their wishes for the following treatment decisions:  Cardiopulmonary Resuscitation: Do Not Attempt Resuscitation (DNR/No CPR)  Medical Interventions: Limited Additional Interventions: Use medical treatment, IV fluids and cardiac monitoring as indicated, DO NOT USE intubation or mechanical ventilation. May consider use of less invasive airway support such as BiPAP or CPAP. Also provide comfort measures. Transfer to the hospital if indicated. Avoid intensive care.    Comment: Patient would want to die in her own home if possible  Antibiotics: Antibiotics if indicated  IV Fluids: IV fluids if indicated  Feeding Tube: No feeding tube    PLAN: MOST form completed and signed as outlined above DNR Living will/HCPOA scanned into system Restart Zoloft at '50mg'$  daily Oral supplements BID Continue treatment as outlined by oncology RTC in 2 weeks   Patient expressed understanding and was in agreement with this plan. She also understands that She can call clinic at any time with any questions, concerns, or complaints.     Time Total: 75 minutes  Visit consisted of counseling and education dealing with the complex and emotionally intense issues of symptom management and palliative care in the  setting of serious and potentially life-threatening illness.Greater than 50%  of this time was spent counseling and coordinating care related to the above assessment and plan.  Signed by: Altha Harm, PhD, NP-C [phone] (Work Cell)

## 2018-09-10 ENCOUNTER — Other Ambulatory Visit: Payer: Self-pay | Admitting: *Deleted

## 2018-09-10 ENCOUNTER — Telehealth: Payer: Self-pay | Admitting: *Deleted

## 2018-09-10 LAB — TYPE AND SCREEN
ABO/RH(D): A POS
Antibody Screen: NEGATIVE
Unit division: 0

## 2018-09-10 LAB — BPAM RBC
Blood Product Expiration Date: 201911292359
ISSUE DATE / TIME: 201911261124
UNIT TYPE AND RH: 600

## 2018-09-10 LAB — PATHOLOGIST SMEAR REVIEW

## 2018-09-10 MED ORDER — POTASSIUM CHLORIDE CRYS ER 20 MEQ PO TBCR: 20.0000 meq | EXTENDED_RELEASE_TABLET | Freq: Every day | ORAL | 0 refills | Status: DC

## 2018-09-10 NOTE — Telephone Encounter (Signed)
Pt called and wanted to have potassium refilled and dr Janese Banks had told her to take bid. So she wan out early because of that . I spoke ot Janese Banks and she wants her to take 20 meq a day. I put new rx in and sent it to pharmacy and sent message to be delivered. I called the home and patient 's husband answered the phone and will tell patient  When she gets home because she is out right now.

## 2018-09-15 NOTE — Progress Notes (Signed)
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 She has been referred to [place] for anemia. Recent CBC from 09/20/2017 showed white count of 5.6, H&H of 9.6/29.8 and a platelet count of 105. TSH was normal at 1.11. CMP was within normal limits. Serum iron was normal at 103. B12 levels were elevated at 1905 and folate was normal at greater than 24. Stool FOBT was negative.  Patient lives with her 39 year old husband and is independent of her ADLs and IADLs. She is still driving. She does feel fatigued on and off but today she reports feeling well. She denies any blood in her stool or urine.  Results of blood work from 10/21/2017 were as follows: CBC showed white count of 10.6, H&H of 10.1/31.9 with a platelet count of 210. Iron studies, haptoglobin and reticulocyte count was normal. Multiple myeloma panel showed no monoclonal protein and IFE was normal  Patients hb dropped down to 6.4 in may 2019. She received 2 units of prbc transfusion. She also had repeat anemia work up which was unremarkable. She then had progressive leucocytosis with wbc in 20's and left shift. She underwent bone marrow biopsy on 04/03/18. Noted to have 19% myeloid blasts in the marrow and 20% blasts in peripheral blood  consistent with acute myeloid leukemia  NGS panel showed NF1 but no other mutations. She was seen by Dr. Janene Madeira at [facility]. She is curently on venetoclax. She is receiving Ray vidaza 7 days a month 1st dose was on 04/23/18  Bone marrow biopsy on day 20 postvidazastill showed 19% blasts.Patient was continued on Vidaza and venetoclax and repeat bone marrow biopsy at 2 months showed less than 5% blasts.   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  Multiple second line options have been discussed with the patient and she is chosen to proceed with single agent Vidaza for palliation   Interval history- she has a sore on her left cheek buccal surface which is healing slowly.  Denies any fever or burnign urination. Denies any falls, bleeding or bruising. Bowel movements are regular  ECOG PS- 2 Pain scale- 0 Opioid associated constipation- no  Review of systems- Review of Systems  Constitutional: Positive for malaise/fatigue. Negative for chills, fever and weight loss.  HENT: Negative for congestion, ear discharge and nosebleeds.        Mouth sore  Eyes: Negative for blurred vision.  Respiratory: Negative for cough, hemoptysis, sputum production, shortness of breath and wheezing.   Cardiovascular: Negative for chest pain, palpitations, orthopnea and claudication.  Gastrointestinal: Negative for abdominal pain, blood in stool, constipation, diarrhea, heartburn, melena, nausea and vomiting.  Genitourinary: Negative for dysuria, flank pain, frequency, hematuria and urgency.  Musculoskeletal: Negative for back pain, joint pain and myalgias.  Skin: Negative for rash.  Neurological: Negative for  dizziness, tingling, focal weakness, seizures, weakness and headaches.  Endo/Heme/Allergies: Does not bruise/bleed easily.  Psychiatric/Behavioral: Negative for depression and suicidal ideas. The patient  does not have insomnia.       Allergies  Allergen Reactions  . Tape Rash  . Gabapentin     Headache, "felt weird in the head"  . Sulfa Antibiotics   . Allopurinol Rash  . Shellfish Allergy Rash     Past Medical History:  Diagnosis Date  . Acute myeloid leukemia ([place])   . Arthritis   . Cancer [facility]) July 2014   T2,N2a, Modified radical mastectomy. 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 COPD (chronic obstructive pulmonary disease) ([place])   . Degenerative disc disease, lumbar   . Hypertension   . Malignant neoplasm of breast (female), unspecified site July 2014   Her case was presented at the [facility] tumor board in July 2014. Recommendations were for chest wall/peripheral lymphatic radiation if metastatic disease was not identified, anti-estrogen therapy only if additional metastatic disease was detected on a PET CT. No plans for adjuvant chemotherapy if metastatic disease was identified was recommended.  . Osteoporosis   . Sciatic pain 2015  . Spinal stenosis      Past Surgical History:  Procedure Laterality Date  . ABDOMINAL HYSTERECTOMY    . BREAST SURGERY Left 04-28-13   left mastectomy, SN biopsy  . EPIDURAL BLOCK INJECTION  2015   x3    Social History   Socioeconomic History  . Marital status: Married    Spouse name: Not on file  . Number of children: Not on file  . Years of education: Not on file  . Highest education level: Not on file  Occupational History  . Not on file  Social Needs  . Financial resource strain: Not on file  . Food insecurity:    Worry: Not on file    Inability: Not on file  . Transportation needs:    Medical: Not on file    Non-medical: Not on file  Tobacco Use  . Smoking status: Never Smoker  . Smokeless tobacco: Never Used  Substance and Sexual Activity  . Alcohol use: No  . Drug use: No  . Sexual activity: Not on file  Lifestyle  . Physical activity:    Days per week: Not on file    Minutes per session:  Not on file  . Stress: Not on file  Relationships  . Social connections:    Talks on phone: Not on file    Gets together: Not on file    Attends religious service: Not on file    Active member of club or organization: Not on file    Attends meetings of clubs or organizations: Not on file    Relationship status: Not on file  . Intimate partner violence:    Fear of current or ex partner: Not on file    Emotionally abused: Not on file    Physically abused: Not on file    Forced sexual activity: Not on file  Other Topics Concern  . Not on file  Social History Narrative  . Not on file    Family History  Problem Relation Age of Onset  . Stroke Mother   . Heart disease Father   . Stroke Father   . Parkinson's disease Sister   . Parkinson's disease Brother      Current Outpatient Medications:  .  alendronate (FOSAMAX) 70 MG tablet, TAKE 1 TABLET BY MOUTH EVERY  7 DAYS. TAKE WITH A FULL GLASS OF WATER ON AN EMPTY STOMACH., Disp: 12 tablet, Rfl: 3 .  amoxicillin-clavulanate (AUGMENTIN) 875-125 MG tablet, Take 1 tablet by mouth 2 (two) times daily., Disp: 60 tablet, Rfl: 0 .  azelastine (ASTELIN) 0.1 % nasal spray, Place into both nostrils 2 (two) times daily as needed. Use in each nostril as directed , Disp: , Rfl:  .  b complex vitamins tablet, Take 1 tablet by mouth daily., Disp: , Rfl:  .  ciprofloxacin (CIPRO) 500 MG tablet, Take 1 tablet (500 mg total) by mouth 2 (two) times daily., Disp: 14 tablet, Rfl: 0 .  docusate sodium (COLACE) 100 MG capsule, Take 100 mg by mouth daily., Disp: , Rfl:  .  famotidine (PEPCID) 10 MG tablet, Take 10 mg by mouth daily as needed for heartburn or indigestion., Disp: , Rfl:  .  fluticasone (FLONASE) 50 MCG/ACT nasal spray, Place 1 spray into both nostrils daily as needed. , Disp: , Rfl:  .  hydrochlorothiazide (HYDRODIURIL) 25 MG tablet, TAKE 1 TABLET BY MOUTH EVERY DAY, Disp: 90 tablet, Rfl: 3 .  LORazepam (ATIVAN) 0.5 MG tablet, Take 1 tablet (0.5  mg total) by mouth every 6 (six) hours as needed (Nausea or vomiting)., Disp: 30 tablet, Rfl: 0 .  magic mouthwash SOLN, Take 5 mLs by mouth 4 (four) times daily as needed for mouth pain., Disp: 240 mL, Rfl: 0 .  naproxen sodium (ALEVE) 220 MG tablet, Take 220 mg by mouth daily as needed. , Disp: , Rfl:  .  OLANZapine (ZYPREXA) 5 MG tablet, Take 1 tablet (5 mg total) by mouth at bedtime., Disp: 30 tablet, Rfl: 0 .  ondansetron (ZOFRAN) 8 MG tablet, Take 1 tablet (8 mg total) by mouth 2 (two) times daily as needed (Nausea or vomiting)., Disp: 30 tablet, Rfl: 1 .  polyethylene glycol (MIRALAX / GLYCOLAX) packet, Take 17 g by mouth daily., Disp: , Rfl:  .  prochlorperazine (COMPAZINE) 10 MG tablet, Take 1 tablet (10 mg total) by mouth every 6 (six) hours as needed (Nausea or vomiting)., Disp: 30 tablet, Rfl: 1 .  Saline 0.2 % SOLN, , Disp: , Rfl:  .  valACYclovir (VALTREX) 500 MG tablet, Take 1 tablet (500 mg total) by mouth daily., Disp: 30 tablet, Rfl: 3 .  potassium chloride SA (K-DUR,KLOR-CON) 20 MEQ tablet, Take 1 tablet (20 mEq total) by mouth daily., Disp: 10 tablet, Rfl: 0 .  sertraline (ZOLOFT) 50 MG tablet, Take 1 tablet (50 mg total) by mouth daily., Disp: 30 tablet, Rfl: 5 No current facility-administered medications for this visit.   Facility-Administered Medications Ordered in Other Visits:  .  heparin lock flush 100 unit/mL, 500 Units, Intracatheter, Daily PRN, Sindy Guadeloupe, MD .  sodium chloride flush (NS) 0.9 % injection 10 mL, 10 mL, Intracatheter, PRN, Sindy Guadeloupe, MD .  sodium chloride flush (NS) 0.9 % injection 3 mL, 3 mL, Intracatheter, PRN, Sindy Guadeloupe, MD  Physical exam:  Vitals:   09/09/18 0850 09/09/18 0856  BP:  118/60  Pulse:  78  Resp: 16   Temp:  98.4 F (36.9 C)  TempSrc:  Tympanic  Weight: 122 lb 4.8 oz (55.5 kg)   Height: _0  (1.549 m)    Physical Exam  Constitutional: She is oriented to person, place, and time.  Tin frail elderly female sitting  ina  Wheelchair. No acute distress  HENT:  Head: Normocephalic and atraumatic.  Mouth/Throat: Oropharynx is clear and moist.  There is a white irregular sore on the buccal aspect of her left cheek which is healing gradually.   Eyes: Pupils are equal, round, and reactive to light. EOM are normal.  Neck: Normal range of motion.  Cardiovascular: Normal rate, regular rhythm and normal heart sounds.  Pulmonary/Chest: Effort normal and breath sounds normal.  Abdominal: Soft. Bowel sounds are normal.  Neurological: She is alert and oriented to person, place, and time.  Skin: Skin is warm and dry.     CMP Latest Ref Rng & Units 09/09/2018  Glucose 70 - 99 mg/dL 139(H)  BUN 8 - 23 mg/dL 22  Creatinine 0.44 - 1.00 mg/dL 0.50  Sodium 135 - 145 mmol/L 136  Potassium 3.5 - 5.1 mmol/L 3.4(L)  Chloride 98 - 111 mmol/L 103  CO2 22 - 32 mmol/L 27  Calcium 8.9 - 10.3 mg/dL 9.4  Total Protein 6.5 - 8.1 g/dL 5.9(L)  Total Bilirubin 0.3 - 1.2 mg/dL 0.6  Alkaline Phos 38 - 126 U/L 94  AST 15 - 41 U/L 13(L)  ALT 0 - 44 U/L 13   CBC Latest Ref Rng & Units 09/09/2018  WBC 4.0 - 10.5 K/uL 1.1(LL)  Hemoglobin 12.0 - 15.0 g/dL 7.3(L)  Hematocrit 36.0 - 46.0 % 22.1(L)  Platelets 150 - 400 K/uL 18(LL)     Assessment and plan- Patient is a 84 y.o. female with refractory aml s/p 3 cycles of vidaza here for routine f.u  Patient continues to have significant pancytopenia requiring weekly transfusions- prbc and platelets. She will get 1 unit of prbc today  Plan at this time is to continue monthly vidaza. Her peripheral blood blasts are staying around <5%  She will continue augmentin and valtrex for bacterial and viral prophylaxis  She will undergo repeat bm [facility] next month at [facility] and see Dr. Janene Madeira thereafter.  She will meet Np Vonna [place] borders from palliative care today to address her goals of care and CODE status   She will get weekly cbc checked.  I will see her back on 09/23/18 with cbc / cmp  for cycle 4 of vidaza   Visit Diagnosis 1. Acute myeloid leukemia not having achieved remission ([place])   2. Other pancytopenia ([place])      Dr. Randa Evens, MD, MPH [facility] [phone] 09/15/2018 8:46 AM

## 2018-09-16 ENCOUNTER — Other Ambulatory Visit: Payer: Self-pay

## 2018-09-16 ENCOUNTER — Other Ambulatory Visit: Payer: Self-pay | Admitting: Oncology

## 2018-09-16 ENCOUNTER — Inpatient Hospital Stay: Payer: Medicare Other | Attending: Oncology

## 2018-09-16 ENCOUNTER — Inpatient Hospital Stay: Payer: Medicare Other

## 2018-09-16 DIAGNOSIS — R5383 Other fatigue: Secondary | ICD-10-CM | POA: Insufficient documentation

## 2018-09-16 DIAGNOSIS — R110 Nausea: Secondary | ICD-10-CM | POA: Insufficient documentation

## 2018-09-16 DIAGNOSIS — D696 Thrombocytopenia, unspecified: Secondary | ICD-10-CM

## 2018-09-16 DIAGNOSIS — C9200 Acute myeloblastic leukemia, not having achieved remission: Secondary | ICD-10-CM

## 2018-09-16 DIAGNOSIS — D61818 Other pancytopenia: Secondary | ICD-10-CM | POA: Insufficient documentation

## 2018-09-16 DIAGNOSIS — D649 Anemia, unspecified: Secondary | ICD-10-CM

## 2018-09-16 LAB — CBC WITH DIFFERENTIAL/PLATELET
ABS IMMATURE GRANULOCYTES: 0.01 10*3/uL (ref 0.00–0.07)
BASOS ABS: 0 10*3/uL (ref 0.0–0.1)
BASOS PCT: 0 %
EOS ABS: 0 10*3/uL (ref 0.0–0.5)
Eosinophils Relative: 0 %
HCT: 21.2 % — ABNORMAL LOW (ref 36.0–46.0)
Hemoglobin: 7.1 g/dL — ABNORMAL LOW (ref 12.0–15.0)
Immature Granulocytes: 1 %
Lymphocytes Relative: 78 %
Lymphs Abs: 1.1 10*3/uL (ref 0.7–4.0)
MCH: 28.7 pg (ref 26.0–34.0)
MCHC: 33.5 g/dL (ref 30.0–36.0)
MCV: 85.8 fL (ref 80.0–100.0)
Monocytes Absolute: 0.1 10*3/uL (ref 0.1–1.0)
Monocytes Relative: 4 %
NEUTROS PCT: 17 %
NRBC: 0 % (ref 0.0–0.2)
Neutro Abs: 0.2 10*3/uL — ABNORMAL LOW (ref 1.7–7.7)
PLATELETS: 7 10*3/uL — AB (ref 150–400)
RBC: 2.47 MIL/uL — AB (ref 3.87–5.11)
RDW: 13.1 % (ref 11.5–15.5)
WBC: 1.4 10*3/uL — AB (ref 4.0–10.5)

## 2018-09-16 LAB — SAMPLE TO BLOOD BANK

## 2018-09-16 LAB — PREPARE RBC (CROSSMATCH)

## 2018-09-16 MED ORDER — SODIUM CHLORIDE 0.9% IV SOLUTION
250.0000 mL | Freq: Once | INTRAVENOUS | Status: AC
  Administered 2018-09-16: 250 mL via INTRAVENOUS
  Filled 2018-09-16: qty 250

## 2018-09-16 MED ORDER — ACETAMINOPHEN 325 MG PO TABS
650.0000 mg | ORAL_TABLET | Freq: Once | ORAL | Status: AC
  Administered 2018-09-16: 650 mg via ORAL
  Filled 2018-09-16: qty 2

## 2018-09-17 LAB — TYPE AND SCREEN
ABO/RH(D): A POS
Antibody Screen: NEGATIVE
Unit division: 0

## 2018-09-17 LAB — BPAM RBC
Blood Product Expiration Date: 201912122359
ISSUE DATE / TIME: 201912031036
Unit Type and Rh: 6200

## 2018-09-17 LAB — PREPARE PLATELET PHERESIS: Unit division: 0

## 2018-09-17 LAB — BPAM PLATELET PHERESIS
Blood Product Expiration Date: 201912042359
ISSUE DATE / TIME: 201912031224
Unit Type and Rh: 600

## 2018-09-19 ENCOUNTER — Inpatient Hospital Stay: Payer: Medicare Other

## 2018-09-19 DIAGNOSIS — C9200 Acute myeloblastic leukemia, not having achieved remission: Secondary | ICD-10-CM

## 2018-09-19 DIAGNOSIS — R5383 Other fatigue: Secondary | ICD-10-CM | POA: Diagnosis not present

## 2018-09-19 DIAGNOSIS — R110 Nausea: Secondary | ICD-10-CM | POA: Diagnosis not present

## 2018-09-19 DIAGNOSIS — D61818 Other pancytopenia: Secondary | ICD-10-CM | POA: Diagnosis not present

## 2018-09-19 LAB — CBC WITH DIFFERENTIAL/PLATELET
Abs Immature Granulocytes: 0.06 10*3/uL (ref 0.00–0.07)
Basophils Absolute: 0 10*3/uL (ref 0.0–0.1)
Basophils Relative: 0 %
Eosinophils Absolute: 0 10*3/uL (ref 0.0–0.5)
Eosinophils Relative: 0 %
HCT: 25.2 % — ABNORMAL LOW (ref 36.0–46.0)
Hemoglobin: 8.6 g/dL — ABNORMAL LOW (ref 12.0–15.0)
Immature Granulocytes: 5 %
LYMPHS ABS: 1 10*3/uL (ref 0.7–4.0)
LYMPHS PCT: 73 %
MCH: 29.3 pg (ref 26.0–34.0)
MCHC: 34.1 g/dL (ref 30.0–36.0)
MCV: 85.7 fL (ref 80.0–100.0)
Monocytes Absolute: 0.2 10*3/uL (ref 0.1–1.0)
Monocytes Relative: 13 %
Neutro Abs: 0.1 10*3/uL — ABNORMAL LOW (ref 1.7–7.7)
Neutrophils Relative %: 9 %
Platelets: 21 10*3/uL — CL (ref 150–400)
RBC: 2.94 MIL/uL — ABNORMAL LOW (ref 3.87–5.11)
RDW: 13.2 % (ref 11.5–15.5)
WBC: 1.3 10*3/uL — CL (ref 4.0–10.5)
nRBC: 0 % (ref 0.0–0.2)

## 2018-09-19 LAB — SAMPLE TO BLOOD BANK

## 2018-09-21 ENCOUNTER — Other Ambulatory Visit: Payer: Self-pay | Admitting: *Deleted

## 2018-09-21 DIAGNOSIS — D649 Anemia, unspecified: Secondary | ICD-10-CM

## 2018-09-21 DIAGNOSIS — D696 Thrombocytopenia, unspecified: Secondary | ICD-10-CM

## 2018-09-21 DIAGNOSIS — C9200 Acute myeloblastic leukemia, not having achieved remission: Secondary | ICD-10-CM

## 2018-09-23 ENCOUNTER — Inpatient Hospital Stay (HOSPITAL_BASED_OUTPATIENT_CLINIC_OR_DEPARTMENT_OTHER): Payer: Medicare Other | Admitting: Oncology

## 2018-09-23 ENCOUNTER — Inpatient Hospital Stay: Payer: Medicare Other

## 2018-09-23 ENCOUNTER — Encounter: Payer: Self-pay | Admitting: Oncology

## 2018-09-23 ENCOUNTER — Other Ambulatory Visit: Payer: Self-pay | Admitting: *Deleted

## 2018-09-23 ENCOUNTER — Other Ambulatory Visit: Payer: Self-pay | Admitting: Oncology

## 2018-09-23 VITALS — BP 121/66 | HR 74 | Temp 97.9°F | Resp 18 | Ht 61.0 in | Wt 121.9 lb

## 2018-09-23 DIAGNOSIS — R5383 Other fatigue: Secondary | ICD-10-CM | POA: Diagnosis not present

## 2018-09-23 DIAGNOSIS — D696 Thrombocytopenia, unspecified: Secondary | ICD-10-CM

## 2018-09-23 DIAGNOSIS — D649 Anemia, unspecified: Secondary | ICD-10-CM

## 2018-09-23 DIAGNOSIS — D61818 Other pancytopenia: Secondary | ICD-10-CM | POA: Diagnosis not present

## 2018-09-23 DIAGNOSIS — C9200 Acute myeloblastic leukemia, not having achieved remission: Secondary | ICD-10-CM

## 2018-09-23 DIAGNOSIS — R110 Nausea: Secondary | ICD-10-CM | POA: Diagnosis not present

## 2018-09-23 DIAGNOSIS — Z9011 Acquired absence of right breast and nipple: Secondary | ICD-10-CM | POA: Diagnosis not present

## 2018-09-23 DIAGNOSIS — Z853 Personal history of malignant neoplasm of breast: Secondary | ICD-10-CM

## 2018-09-23 DIAGNOSIS — I10 Essential (primary) hypertension: Secondary | ICD-10-CM

## 2018-09-23 LAB — CBC WITH DIFFERENTIAL/PLATELET
ABS IMMATURE GRANULOCYTES: 0 10*3/uL (ref 0.00–0.07)
Basophils Absolute: 0 10*3/uL (ref 0.0–0.1)
Basophils Relative: 0 %
Eosinophils Absolute: 0 10*3/uL (ref 0.0–0.5)
Eosinophils Relative: 0 %
HCT: 20.7 % — ABNORMAL LOW (ref 36.0–46.0)
Hemoglobin: 7 g/dL — ABNORMAL LOW (ref 12.0–15.0)
Immature Granulocytes: 0 %
LYMPHS ABS: 0.7 10*3/uL (ref 0.7–4.0)
Lymphocytes Relative: 78 %
MCH: 28.9 pg (ref 26.0–34.0)
MCHC: 33.8 g/dL (ref 30.0–36.0)
MCV: 85.5 fL (ref 80.0–100.0)
Monocytes Absolute: 0.1 10*3/uL (ref 0.1–1.0)
Monocytes Relative: 14 %
NRBC: 0 % (ref 0.0–0.2)
Neutro Abs: 0.1 10*3/uL — ABNORMAL LOW (ref 1.7–7.7)
Neutrophils Relative %: 8 %
Platelets: 5 10*3/uL — CL (ref 150–400)
RBC: 2.42 MIL/uL — ABNORMAL LOW (ref 3.87–5.11)
RDW: 12.9 % (ref 11.5–15.5)
WBC: 1 10*3/uL — CL (ref 4.0–10.5)

## 2018-09-23 LAB — COMPREHENSIVE METABOLIC PANEL
ALBUMIN: 3.3 g/dL — AB (ref 3.5–5.0)
ALT: 22 U/L (ref 0–44)
AST: 16 U/L (ref 15–41)
Alkaline Phosphatase: 91 U/L (ref 38–126)
Anion gap: 5 (ref 5–15)
BILIRUBIN TOTAL: 0.7 mg/dL (ref 0.3–1.2)
BUN: 19 mg/dL (ref 8–23)
CO2: 26 mmol/L (ref 22–32)
Calcium: 9 mg/dL (ref 8.9–10.3)
Chloride: 100 mmol/L (ref 98–111)
Creatinine, Ser: 0.49 mg/dL (ref 0.44–1.00)
GFR calc Af Amer: >60 mL/min (ref >60)
GFR calc non Af Amer: >60 mL/min (ref >60)
GLUCOSE: 128 mg/dL — AB (ref 70–99)
Potassium: 3.6 mmol/L (ref 3.5–5.1)
SODIUM: 131 mmol/L — AB (ref 135–145)
Total Protein: 5.7 g/dL — ABNORMAL LOW (ref 6.5–8.1)

## 2018-09-23 LAB — SAMPLE TO BLOOD BANK

## 2018-09-23 LAB — PATHOLOGIST SMEAR REVIEW

## 2018-09-23 MED ORDER — ACETAMINOPHEN 325 MG PO TABS
650.0000 mg | ORAL_TABLET | Freq: Once | ORAL | Status: AC
  Administered 2018-09-23: 650 mg via ORAL
  Filled 2018-09-23: qty 2

## 2018-09-23 MED ORDER — SODIUM CHLORIDE 0.9% IV SOLUTION
250.0000 mL | Freq: Once | INTRAVENOUS | Status: AC
  Administered 2018-09-23: 250 mL via INTRAVENOUS
  Filled 2018-09-23: qty 250

## 2018-09-23 MED ORDER — POTASSIUM CHLORIDE CRYS ER 20 MEQ PO TBCR: 20.0000 meq | EXTENDED_RELEASE_TABLET | Freq: Every day | ORAL | 0 refills | Status: DC

## 2018-09-23 NOTE — Progress Notes (Signed)
New ulcer noted in the patient mouth x 1 week ( very painful)

## 2018-09-23 NOTE — Progress Notes (Signed)
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 She has been referred to [place] for anemia. Recent CBC from 09/20/2017 showed white count of 5.6, H&H of 9.6/29.8 and a platelet count of 105. TSH was normal at 1.11. CMP was within normal limits. Serum iron was normal at 103. B12 levels were elevated at 1905 and folate was normal at greater than 24. Stool FOBT was negative.  Patient lives with her 66 year old husband and is independent of her ADLs and IADLs. She is still driving. She does feel fatigued on and off but today she reports feeling well. She denies any blood in her stool or urine.  Results of blood work from 10/21/2017 were as follows: CBC showed white count of 10.6, H&H of 10.1/31.9 with a platelet count of 210. Iron studies, haptoglobin and reticulocyte count was normal. Multiple myeloma panel showed no monoclonal protein and IFE was normal  Patients hb dropped down to 6.4 in may 2019. She received 2 units of prbc transfusion. She also had repeat anemia work up which was unremarkable. She then had progressive leucocytosis with wbc in 20's and left shift. She underwent bone marrow biopsy on 04/03/18. Noted to have 19% myeloid blasts in the marrow and 20% blasts in peripheral  blood consistent with acute myeloid leukemia  NGS panel showed NF1 but no other mutations. She was seen by Dr. Janene Madeira at [facility]. She is curently on venetoclax. She is receiving [place] vidaza 7 days a month 1st dose was on 04/23/18  Bone marrow biopsy on day 20 postvidazastill showed 19% blasts.Patient was continued on Vidaza and venetoclax and repeat bone marrow biopsy at 2 months showed less than 5% blasts.   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  Multiple second line options have been discussed with the patient and she is chosen to proceed with single agent Vidaza for palliation  Interval history-overall patient feels well and she even went to the church a few days ago and drove herself.  She continues to be independent of her ADLs at home.  Denies any bleeding, bruising, nausea vomiting or constipation.  She does have fatigue which is unchanged  ECOG PS- 2 Pain scale- 0 Opioid associated constipation- no  Review of systems- Review of Systems  Constitutional: Positive for malaise/fatigue. Negative for chills, fever and weight loss.  HENT: Negative for congestion, ear discharge and nosebleeds.   Eyes: Negative for blurred vision.  Respiratory: Negative for cough, hemoptysis, sputum production, shortness of breath and wheezing.   Cardiovascular: Negative for chest pain, palpitations, orthopnea and claudication.  Gastrointestinal: Negative for abdominal pain, blood in stool, constipation, diarrhea, heartburn, melena, nausea and vomiting.  Genitourinary: Negative for dysuria, flank pain, frequency, hematuria and urgency.  Musculoskeletal: Negative for back pain, joint pain and myalgias.  Skin: Negative for rash.  Neurological: Negative for dizziness, tingling, focal weakness, seizures, weakness and headaches.  Endo/Heme/Allergies: Does not bruise/bleed easily.  Psychiatric/Behavioral:  Negative for depression and suicidal ideas. The patient does not have insomnia.        Allergies  Allergen Reactions  . Tape Rash  . Gabapentin     Headache, "felt weird in the head"  . Sulfa Antibiotics   . Allopurinol Rash  . Shellfish Allergy Rash     Past Medical History:  Diagnosis Date  . Acute myeloid leukemia ([place])   . Arthritis   . Cancer [facility]) July 2014   T2,N2a, Modified radical mastectomy. 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 COPD (chronic obstructive pulmonary disease) ([place])   . Degenerative disc disease, lumbar   . Hypertension   . Malignant neoplasm of breast (female), unspecified site July 2014   Her case was presented at the [facility] tumor board in July 2014. Recommendations were for chest wall/peripheral lymphatic radiation if metastatic disease was not identified, anti-estrogen therapy only if additional metastatic disease was detected on a PET CT. No plans for adjuvant chemotherapy if metastatic disease was identified was recommended.  . Osteoporosis   . Sciatic pain 2015  . Spinal stenosis      Past Surgical History:  Procedure Laterality Date  . ABDOMINAL HYSTERECTOMY    . BREAST SURGERY Left 04-28-13   left mastectomy, SN biopsy  . EPIDURAL BLOCK INJECTION  2015   x3    Social History   Socioeconomic History  . Marital status: Married    Spouse name: Not on file  . Number of children: Not on file  . Years of education: Not on file  . Highest education level: Not on file  Occupational History  . Not on file  Social Needs  . Financial resource strain: Not on file  . Food insecurity:    Worry: Not on file    Inability: Not on file  . Transportation needs:    Medical: Not on file    Non-medical: Not on file  Tobacco Use  . Smoking status: Never Smoker  . Smokeless tobacco: Never Used  Substance and Sexual Activity  . Alcohol use: No  . Drug use: No  . Sexual activity: Not on file  Lifestyle  . Physical activity:     Days per week: Not on file    Minutes per session: Not on file  . Stress: Not on file  Relationships  . Social connections:    Talks on phone: Not on file    Gets together: Not on file    Attends religious service: Not on file    Active member of club or organization: Not on file    Attends meetings of clubs or organizations: Not on file    Relationship status: Not on file  . Intimate partner violence:    Fear of current or ex partner: Not on file    Emotionally abused: Not on file    Physically abused: Not on file    Forced sexual activity: Not on file  Other Topics Concern  . Not on file  Social History Narrative  . Not on file    Family History  Problem Relation Age of Onset  . Stroke Mother   . Heart disease Father   . Stroke Father   . Parkinson's disease Sister   . Parkinson's disease Brother      Current Outpatient Medications:  .  alendronate (FOSAMAX) 70 MG tablet, TAKE 1  TABLET BY MOUTH EVERY 7 DAYS. TAKE WITH A FULL GLASS OF WATER ON AN EMPTY STOMACH., Disp: 12 tablet, Rfl: 3 .  amoxicillin-clavulanate (AUGMENTIN) 875-125 MG tablet, Take 1 tablet by mouth 2 (two) times daily., Disp: 60 tablet, Rfl: 0 .  b complex vitamins tablet, Take 1 tablet by mouth daily., Disp: , Rfl:  .  docusate sodium (COLACE) 100 MG capsule, Take 100 mg by mouth daily., Disp: , Rfl:  .  hydrochlorothiazide (HYDRODIURIL) 25 MG tablet, TAKE 1 TABLET BY MOUTH EVERY DAY, Disp: 90 tablet, Rfl: 3 .  OLANZapine (ZYPREXA) 5 MG tablet, Take 1 tablet (5 mg total) by mouth at bedtime., Disp: 30 tablet, Rfl: 0 .  potassium chloride SA (K-DUR,KLOR-CON) 20 MEQ tablet, Take 1 tablet (20 mEq total) by mouth daily., Disp: 10 tablet, Rfl: 0 .  sertraline (ZOLOFT) 50 MG tablet, Take 1 tablet (50 mg total) by mouth daily., Disp: 30 tablet, Rfl: 5 .  valACYclovir (VALTREX) 500 MG tablet, Take 1 tablet (500 mg total) by mouth daily., Disp: 30 tablet, Rfl: 3 .  azelastine (ASTELIN) 0.1 % nasal spray, Place  into both nostrils 2 (two) times daily as needed. Use in each nostril as directed , Disp: , Rfl:  .  famotidine (PEPCID) 10 MG tablet, Take 10 mg by mouth daily as needed for heartburn or indigestion., Disp: , Rfl:  .  fluticasone (FLONASE) 50 MCG/ACT nasal spray, Place 1 spray into both nostrils daily as needed. , Disp: , Rfl:  .  LORazepam (ATIVAN) 0.5 MG tablet, Take 1 tablet (0.5 mg total) by mouth every 6 (six) hours as needed (Nausea or vomiting). (Patient not taking: Reported on 09/23/2018), Disp: 30 tablet, Rfl: 0 .  magic mouthwash SOLN, Take 5 mLs by mouth 4 (four) times daily as needed for mouth pain. (Patient not taking: Reported on 09/23/2018), Disp: 240 mL, Rfl: 0 .  naproxen sodium (ALEVE) 220 MG tablet, Take 220 mg by mouth daily as needed. , Disp: , Rfl:  .  ondansetron (ZOFRAN) 8 MG tablet, Take 1 tablet (8 mg total) by mouth 2 (two) times daily as needed (Nausea or vomiting). (Patient not taking: Reported on 09/23/2018), Disp: 30 tablet, Rfl: 1 .  polyethylene glycol (MIRALAX / GLYCOLAX) packet, Take 17 g by mouth daily., Disp: , Rfl:  .  prochlorperazine (COMPAZINE) 10 MG tablet, Take 1 tablet (10 mg total) by mouth every 6 (six) hours as needed (Nausea or vomiting). (Patient not taking: Reported on 09/23/2018), Disp: 30 tablet, Rfl: 1 .  Saline 0.2 % SOLN, , Disp: , Rfl:  No current facility-administered medications for this visit.   Facility-Administered Medications Ordered in Other Visits:  .  heparin lock flush 100 unit/mL, 500 Units, Intracatheter, Daily PRN, Sindy Guadeloupe, MD .  sodium chloride flush (NS) 0.9 % injection 10 mL, 10 mL, Intracatheter, PRN, Sindy Guadeloupe, MD .  sodium chloride flush (NS) 0.9 % injection 3 mL, 3 mL, Intracatheter, PRN, Sindy Guadeloupe, MD  Physical exam:  Vitals:   09/23/18 0910  BP: 121/66  Pulse: 74  Resp: 18  Temp: 97.9 F (36.6 C)  TempSrc: Tympanic  SpO2: 100%  Weight: 121 lb 14.4 oz (55.3 kg)  Height: _0  (1.549 m)    Physical Exam  Constitutional: She is oriented to person, place, and time.  Frail elderly woman sitting in a wheelchair.  Appears in no acute distress  HENT:  Head: Normocephalic and atraumatic.  Small area of mucocutaneous blood filled  lesion noted over right buccal surface posteriorly.  No active bleeding  Eyes: Pupils are equal, round, and reactive to light. EOM are normal.  Neck: Normal range of motion.  Cardiovascular: Normal rate, regular rhythm and normal heart sounds.  Pulmonary/Chest: Effort normal and breath sounds normal.  Abdominal: Soft. Bowel sounds are normal.  Neurological: She is alert and oriented to person, place, and time.  Skin: Skin is warm and dry.     CMP Latest Ref Rng & Units 09/23/2018  Glucose 70 - 99 mg/dL 128(H)  BUN 8 - 23 mg/dL 19  Creatinine 0.44 - 1.00 mg/dL 0.49  Sodium 135 - 145 mmol/L 131(L)  Potassium 3.5 - 5.1 mmol/L 3.6  Chloride 98 - 111 mmol/L 100  CO2 22 - 32 mmol/L 26  Calcium 8.9 - 10.3 mg/dL 9.0  Total Protein 6.5 - 8.1 g/dL 5.7(L)  Total Bilirubin 0.3 - 1.2 mg/dL 0.7  Alkaline Phos 38 - 126 U/L 91  AST 15 - 41 U/L 16  ALT 0 - 44 U/L 22   CBC Latest Ref Rng & Units 09/23/2018  WBC 4.0 - 10.5 K/uL 1.0(LL)  Hemoglobin 12.0 - 15.0 g/dL 7.0(L)  Hematocrit 36.0 - 46.0 % 20.7(L)  Platelets 150 - 400 K/uL 5(LL)     Assessment and plan- Patient is a 84 y.o. female  with refractory aml here for treatment assessment prior to cycle 4 of vidaza  Patient has severe pancytopenia today and I will therefore hold her Vidaza for at least 2 days.  She will receive 1 unit of PRBC today along with 1 unit of platelet transfusion.  She will return to clinic on 09/25/2018 for possible blood in her platelet transfusion and we will consider restarting her vides at that time.  She will complete her fourth cycle on 10/03/2018.  Patient continues to have severe pancytopenia with no count recovery despite being on Vidaza.  Suspect the pancytopenia is due  to her underlying leukemia rather than Vidaza.  Her peripheral blood blasts have remained less than 5%.  She will be seen at [facility] on 10/06/2018 for a bone marrow biopsy followed by a visit with Dr. Janene Madeira on 10/09/2018.  I will tentatively see her back on 10/20/2018.  She will continue to get CBC checked twice a week for possible blood VOH/KG67 platelet transfusion   Visit Diagnosis 1. Severe thrombocytopenia (HCC)   2. Symptomatic anemia   3. Acute myeloid leukemia not having achieved remission ([place])      Dr. Randa Evens, MD, MPH [facility] at [facility] [phone] 09/23/2018 1:09 PM

## 2018-09-24 ENCOUNTER — Inpatient Hospital Stay: Payer: Medicare Other

## 2018-09-24 ENCOUNTER — Inpatient Hospital Stay: Payer: Medicare Other | Admitting: Hospice and Palliative Medicine

## 2018-09-24 LAB — PREPARE PLATELET PHERESIS: Unit division: 0

## 2018-09-24 LAB — BPAM PLATELET PHERESIS
Blood Product Expiration Date: 201912112359
ISSUE DATE / TIME: 201912101310
Unit Type and Rh: 600

## 2018-09-25 ENCOUNTER — Other Ambulatory Visit: Payer: Self-pay

## 2018-09-25 ENCOUNTER — Other Ambulatory Visit: Payer: Self-pay | Admitting: Oncology

## 2018-09-25 ENCOUNTER — Inpatient Hospital Stay: Payer: Medicare Other

## 2018-09-25 VITALS — BP 134/71 | HR 65 | Temp 98.1°F | Resp 18

## 2018-09-25 DIAGNOSIS — R5383 Other fatigue: Secondary | ICD-10-CM | POA: Diagnosis not present

## 2018-09-25 DIAGNOSIS — D61818 Other pancytopenia: Secondary | ICD-10-CM | POA: Diagnosis not present

## 2018-09-25 DIAGNOSIS — C9200 Acute myeloblastic leukemia, not having achieved remission: Secondary | ICD-10-CM

## 2018-09-25 DIAGNOSIS — D649 Anemia, unspecified: Secondary | ICD-10-CM

## 2018-09-25 DIAGNOSIS — R110 Nausea: Secondary | ICD-10-CM | POA: Diagnosis not present

## 2018-09-25 LAB — CBC WITH DIFFERENTIAL/PLATELET
Abs Immature Granulocytes: 0.02 10*3/uL (ref 0.00–0.07)
Basophils Absolute: 0 10*3/uL (ref 0.0–0.1)
Basophils Relative: 0 %
Eosinophils Absolute: 0 10*3/uL (ref 0.0–0.5)
Eosinophils Relative: 0 %
HEMATOCRIT: 21.4 % — AB (ref 36.0–46.0)
Hemoglobin: 7.2 g/dL — ABNORMAL LOW (ref 12.0–15.0)
Immature Granulocytes: 2 %
Lymphocytes Relative: 83 %
Lymphs Abs: 1.1 10*3/uL (ref 0.7–4.0)
MCH: 28.9 pg (ref 26.0–34.0)
MCHC: 33.6 g/dL (ref 30.0–36.0)
MCV: 85.9 fL (ref 80.0–100.0)
MONO ABS: 0.1 10*3/uL (ref 0.1–1.0)
Monocytes Relative: 10 %
NEUTROS PCT: 5 %
NRBC: 0 % (ref 0.0–0.2)
Neutro Abs: 0.1 10*3/uL — ABNORMAL LOW (ref 1.7–7.7)
Platelets: 32 10*3/uL — ABNORMAL LOW (ref 150–400)
RBC: 2.49 MIL/uL — ABNORMAL LOW (ref 3.87–5.11)
RDW: 13.2 % (ref 11.5–15.5)
WBC: 1.3 10*3/uL — CL (ref 4.0–10.5)

## 2018-09-25 LAB — PREPARE RBC (CROSSMATCH)

## 2018-09-25 MED ORDER — AZACITIDINE CHEMO SQ INJECTION
75.0000 mg/m2 | Freq: Once | INTRAMUSCULAR | Status: AC
  Administered 2018-09-25: 122.5 mg via SUBCUTANEOUS
  Filled 2018-09-25: qty 4.9

## 2018-09-25 MED ORDER — ONDANSETRON HCL 4 MG PO TABS
8.0000 mg | ORAL_TABLET | Freq: Once | ORAL | Status: AC
  Administered 2018-09-25: 8 mg via ORAL
  Filled 2018-09-25: qty 2

## 2018-09-25 NOTE — Progress Notes (Signed)
Per Judeen Hammans, Dr. Janese Banks says ok for pt to receive vidaza today with plt at 1. Pt to also return to clinic for scheduled appt tomorrow to receive 1 unit of blood. Pt made aware.

## 2018-09-26 ENCOUNTER — Inpatient Hospital Stay: Payer: Medicare Other

## 2018-09-26 ENCOUNTER — Other Ambulatory Visit: Payer: Self-pay | Admitting: Oncology

## 2018-09-26 VITALS — BP 117/58 | HR 65 | Temp 96.1°F | Resp 18

## 2018-09-26 DIAGNOSIS — D649 Anemia, unspecified: Secondary | ICD-10-CM

## 2018-09-26 DIAGNOSIS — R110 Nausea: Secondary | ICD-10-CM | POA: Diagnosis not present

## 2018-09-26 DIAGNOSIS — R5383 Other fatigue: Secondary | ICD-10-CM | POA: Diagnosis not present

## 2018-09-26 DIAGNOSIS — D61818 Other pancytopenia: Secondary | ICD-10-CM | POA: Diagnosis not present

## 2018-09-26 DIAGNOSIS — C9200 Acute myeloblastic leukemia, not having achieved remission: Secondary | ICD-10-CM

## 2018-09-26 LAB — TYPE AND SCREEN
ABO/RH(D): A POS
ANTIBODY SCREEN: NEGATIVE
UNIT DIVISION: 0
Unit division: 0

## 2018-09-26 LAB — PREPARE RBC (CROSSMATCH)

## 2018-09-26 LAB — BPAM RBC
Blood Product Expiration Date: 201912192359
Blood Product Expiration Date: 201912242359
ISSUE DATE / TIME: 201912101127
Unit Type and Rh: 600
Unit Type and Rh: 6200

## 2018-09-26 MED ORDER — ACETAMINOPHEN 325 MG PO TABS
650.0000 mg | ORAL_TABLET | Freq: Once | ORAL | Status: AC
  Administered 2018-09-26: 650 mg via ORAL
  Filled 2018-09-26: qty 2

## 2018-09-26 MED ORDER — ONDANSETRON HCL 4 MG PO TABS
8.0000 mg | ORAL_TABLET | Freq: Once | ORAL | Status: AC
  Administered 2018-09-26: 8 mg via ORAL
  Filled 2018-09-26: qty 2

## 2018-09-26 MED ORDER — AZACITIDINE CHEMO SQ INJECTION
75.0000 mg/m2 | Freq: Once | INTRAMUSCULAR | Status: AC
  Administered 2018-09-26: 122.5 mg via SUBCUTANEOUS
  Filled 2018-09-26: qty 4.9

## 2018-09-26 MED ORDER — SODIUM CHLORIDE 0.9% IV SOLUTION
250.0000 mL | Freq: Once | INTRAVENOUS | Status: AC
  Administered 2018-09-26: 250 mL via INTRAVENOUS
  Filled 2018-09-26: qty 250

## 2018-09-26 NOTE — Progress Notes (Signed)
Per Judeen Hammans RN per Dr. Janese Banks okay to proceed with Vidaza with 09/25/18 CBC results (hemoglobin 7.2, Platelets 32, ANC 0.1, WBC 1.3) pt to receive one unit PRBCs today.

## 2018-09-27 LAB — BPAM RBC
Blood Product Expiration Date: 201912192359
ISSUE DATE / TIME: 201912131133
UNIT TYPE AND RH: 600

## 2018-09-27 LAB — TYPE AND SCREEN
ABO/RH(D): A POS
Antibody Screen: NEGATIVE
Unit division: 0

## 2018-09-29 ENCOUNTER — Other Ambulatory Visit: Payer: Self-pay | Admitting: Oncology

## 2018-09-29 ENCOUNTER — Inpatient Hospital Stay: Payer: Medicare Other

## 2018-09-29 ENCOUNTER — Ambulatory Visit
Admission: RE | Admit: 2018-09-29 | Discharge: 2018-09-29 | Disposition: A | Discharge status: Home / Self Care | Payer: Medicare Other | Source: Ambulatory Visit | Attending: Nurse Practitioner | Admitting: Nurse Practitioner

## 2018-09-29 ENCOUNTER — Encounter: Payer: Self-pay | Admitting: Nurse Practitioner

## 2018-09-29 ENCOUNTER — Other Ambulatory Visit: Payer: Self-pay

## 2018-09-29 ENCOUNTER — Encounter: Payer: Self-pay | Admitting: Oncology

## 2018-09-29 ENCOUNTER — Inpatient Hospital Stay (HOSPITAL_BASED_OUTPATIENT_CLINIC_OR_DEPARTMENT_OTHER): Payer: Medicare Other | Admitting: Nurse Practitioner

## 2018-09-29 VITALS — BP 124/76 | HR 82 | Temp 102.3°F | Resp 16

## 2018-09-29 DIAGNOSIS — K1379 Other lesions of oral mucosa: Secondary | ICD-10-CM | POA: Diagnosis not present

## 2018-09-29 DIAGNOSIS — D709 Neutropenia, unspecified: Secondary | ICD-10-CM

## 2018-09-29 DIAGNOSIS — R0989 Other specified symptoms and signs involving the circulatory and respiratory systems: Secondary | ICD-10-CM | POA: Diagnosis not present

## 2018-09-29 DIAGNOSIS — R5081 Fever presenting with conditions classified elsewhere: Secondary | ICD-10-CM

## 2018-09-29 DIAGNOSIS — Z9012 Acquired absence of left breast and nipple: Secondary | ICD-10-CM

## 2018-09-29 DIAGNOSIS — R5383 Other fatigue: Secondary | ICD-10-CM

## 2018-09-29 DIAGNOSIS — D61818 Other pancytopenia: Secondary | ICD-10-CM | POA: Diagnosis not present

## 2018-09-29 DIAGNOSIS — C9200 Acute myeloblastic leukemia, not having achieved remission: Secondary | ICD-10-CM

## 2018-09-29 DIAGNOSIS — D696 Thrombocytopenia, unspecified: Secondary | ICD-10-CM

## 2018-09-29 DIAGNOSIS — D649 Anemia, unspecified: Secondary | ICD-10-CM

## 2018-09-29 DIAGNOSIS — R630 Anorexia: Secondary | ICD-10-CM

## 2018-09-29 DIAGNOSIS — R509 Fever, unspecified: Secondary | ICD-10-CM

## 2018-09-29 DIAGNOSIS — R110 Nausea: Secondary | ICD-10-CM | POA: Diagnosis not present

## 2018-09-29 DIAGNOSIS — J189 Pneumonia, unspecified organism: Secondary | ICD-10-CM

## 2018-09-29 DIAGNOSIS — Z853 Personal history of malignant neoplasm of breast: Secondary | ICD-10-CM

## 2018-09-29 DIAGNOSIS — J181 Lobar pneumonia, unspecified organism: Secondary | ICD-10-CM

## 2018-09-29 DIAGNOSIS — Z7189 Other specified counseling: Secondary | ICD-10-CM

## 2018-09-29 LAB — COMPREHENSIVE METABOLIC PANEL
ALT: 24 U/L (ref 0–44)
AST: 14 U/L — ABNORMAL LOW (ref 15–41)
Albumin: 3 g/dL — ABNORMAL LOW (ref 3.5–5.0)
Alkaline Phosphatase: 88 U/L (ref 38–126)
Anion gap: 6 (ref 5–15)
BUN: 19 mg/dL (ref 8–23)
CO2: 26 mmol/L (ref 22–32)
CREATININE: 0.61 mg/dL (ref 0.44–1.00)
Calcium: 8.8 mg/dL — ABNORMAL LOW (ref 8.9–10.3)
Chloride: 93 mmol/L — ABNORMAL LOW (ref 98–111)
GFR calc Af Amer: >60 mL/min (ref >60)
GFR calc non Af Amer: >60 mL/min (ref >60)
Glucose, Bld: 151 mg/dL — ABNORMAL HIGH (ref 70–99)
Potassium: 4.4 mmol/L (ref 3.5–5.1)
Sodium: 125 mmol/L — ABNORMAL LOW (ref 135–145)
Total Bilirubin: 0.8 mg/dL (ref 0.3–1.2)
Total Protein: 5.9 g/dL — ABNORMAL LOW (ref 6.5–8.1)

## 2018-09-29 LAB — CBC WITH DIFFERENTIAL/PLATELET
Abs Immature Granulocytes: 0.04 10*3/uL (ref 0.00–0.07)
BASOS PCT: 0 %
Basophils Absolute: 0 10*3/uL (ref 0.0–0.1)
Eosinophils Absolute: 0 10*3/uL (ref 0.0–0.5)
Eosinophils Relative: 0 %
HEMATOCRIT: 21.8 % — AB (ref 36.0–46.0)
Hemoglobin: 7.4 g/dL — ABNORMAL LOW (ref 12.0–15.0)
Immature Granulocytes: 3 %
Lymphocytes Relative: 63 %
Lymphs Abs: 1 10*3/uL (ref 0.7–4.0)
MCH: 29 pg (ref 26.0–34.0)
MCHC: 33.9 g/dL (ref 30.0–36.0)
MCV: 85.5 fL (ref 80.0–100.0)
MONOS PCT: 26 %
Monocytes Absolute: 0.4 10*3/uL (ref 0.1–1.0)
Neutro Abs: 0.1 10*3/uL — ABNORMAL LOW (ref 1.7–7.7)
Neutrophils Relative %: 8 %
Platelets: 5 10*3/uL — CL (ref 150–400)
RBC: 2.55 MIL/uL — ABNORMAL LOW (ref 3.87–5.11)
RDW: 13.2 % (ref 11.5–15.5)
WBC: 1.6 10*3/uL — ABNORMAL LOW (ref 4.0–10.5)
nRBC: 0 % (ref 0.0–0.2)

## 2018-09-29 LAB — SAMPLE TO BLOOD BANK

## 2018-09-29 LAB — PREPARE RBC (CROSSMATCH)

## 2018-09-29 MED ORDER — SODIUM CHLORIDE 0.9 % IV SOLN
Freq: Once | INTRAVENOUS | Status: AC
  Administered 2018-09-29: 16:00:00 via INTRAVENOUS
  Filled 2018-09-29: qty 250

## 2018-09-29 NOTE — Progress Notes (Signed)
Per NP, Beckey Rutter, order: no Vidaza treatment today.

## 2018-09-29 NOTE — Progress Notes (Signed)
Symptom Management [place]  [phone] [phone]  Patient Care Team: Jerrol Banana., MD as PCP - General (Family Medicine) Bary Castilla, Forest Gleason, MD as Consulting Physician (General Surgery) Idelle Leech, [place] as Consulting Physician (Optometry) Sindy Guadeloupe, MD as Medical Oncologist (Medical Oncology)   Name of the patient: Crystal Carpenter  [id number]  07-02-1924   Date of visit: 09/29/18  Diagnosis-refractory AML  Chief complaint/ Reason for visit-mouth sore, fever  Heme/Onc history:  Oncology History   Patient presented as 84 year old female with past medical history significant for breast cancer, hypercholesterolemia, hypertension, and others.  She was referred to [facility] for anemia.  CBC from 09/20/2017 showed wbc 5.6, H&H 9.6/29.8, platelet count 105.  TSH was 1.11 (normal).  CMP was within normal limits.  Serum iron was normal at 103.  B12 levels were elevated at 1905 and folate was normal at greater than 24.  Stool FOBT was negative.  Patient lives with her 60 year old husband and is independent of her ADLs and IADLs.  She still drives.  She reported fatigue but overall feeling well at that time.  Denied blood in her stool or urine.  Results of blood work from 10/21/2017 were as follows: CBC showed white count of 10.6, H&H of 10.1/31.9 with a platelet count of 210. Iron studies, haptoglobin and reticulocyte count was normal. Multiple myeloma panel showed no monoclonal protein and IFE was normal  Patients hb dropped down to 6.4 in may 2019. She received 2 units of prbc transfusion. She also had repeat anemia work up which was unremarkable. She then had progressive leucocytosis with wbc in 20's and left shift. She underwent bone marrow biopsy on 04/03/18. Noted to have 19% myeloid blasts in the marrow and 20% blasts in peripheral blood consistent with acute myeloid leukemia  NGS panel showed NF1 but no other mutations. She  was seen by Dr. Janene Madeira at [facility]. She was started on venetoclax. She is receiving Lynndyl vidaza 7 days a month. Initiated on 04/23/18  Bone marrow biopsy on day 20 post vidaza still showed 19% blasts.       AML (acute myeloblastic leukemia) ([place])   04/09/2018 Initial Diagnosis    AML (acute myeloblastic leukemia) ([place])    05/21/2018 -  Chemotherapy    The patient had azaCITIDine (VIDAZA) chemo injection 122.5 mg, 75 mg/m2 = 122.5 mg, Subcutaneous,  Once, 4 of 4 cycles Administration: 122.5 mg (05/21/2018), 122.5 mg (05/22/2018), 122.5 mg (07/29/2018), 122.5 mg (05/23/2018), 122.5 mg (05/26/2018), 122.5 mg (05/27/2018), 122.5 mg (05/28/2018), 122.5 mg (05/29/2018), 122.5 mg (07/30/2018), 122.5 mg (07/31/2018), 122.5 mg (08/01/2018), 122.5 mg (08/04/2018), 122.5 mg (08/05/2018), 122.5 mg (08/06/2018), 122.5 mg (08/26/2018), 122.5 mg (08/27/2018), 122.5 mg (08/28/2018), 122.5 mg (08/29/2018), 122.5 mg (09/01/2018), 122.5 mg (09/02/2018), 122.5 mg (09/03/2018), 122.5 mg (09/25/2018), 122.5 mg (09/26/2018)  for chemotherapy treatment.      Interval history- Crystal Carpenter, 84 year old female with above history of AML, who presents to symptom management clinic, for mouth sore on inside of left cheek.  She states she has had this sore for approximately 3 weeks but it has progressively worsened over the past few days and now prevents her from eating and drinking.  Associated symptoms: increased malaise ('in bed all weekend').  Progressive fatigue and poor appetite. Lesion is painful. She has not tried taking anything for this. Daughter reports patient had temp of 100.7 yesterday at home but it went down to ~99 with removal of warm blankets,  etc. She is currently taking Augmentin & Valtrex. Was previously prescribed Magic Mouthwash but has not been taking it. Also reports low appetite r/t chronic nausea. Has not been taking anti-emetics recently. She drinks 1-2 boost per day. Denies vomiting. Denies worsening of symptoms.    She has easy bruising and daughter reported concern over "knot" on her right thigh.  Patient states that she hit it several weeks ago and hit it again recently. States it does not bother her.   She denies any nausea, vomiting, constipation, or diarrhea.  Denies any urinary complaints.  ECOG FS:2 - Symptomatic, <50% confined to bed  Review of systems- Review of Systems  Constitutional: Positive for chills (I stay cold), fever, malaise/fatigue and weight loss (poor appetite chronically). Negative for diaphoresis.  HENT: Positive for hearing loss (no hearing aids today) and sore throat (mouth sore). Negative for congestion, ear discharge, ear pain, nosebleeds, sinus pain and tinnitus.   Eyes: Negative.   Respiratory: Negative.   Cardiovascular: Negative.   Gastrointestinal: Positive for heartburn and nausea. Negative for abdominal pain, blood in stool, constipation, diarrhea, melena and vomiting.  Genitourinary: Negative.   Musculoskeletal: Negative.   Skin: Negative for itching and rash.       Multiple bruises  Neurological: Negative.   Endo/Heme/Allergies: Bruises/bleeds easily.  Psychiatric/Behavioral: Positive for depression. Negative for memory loss. The patient has insomnia (intermittently/rarely).     Current treatment- Vidaza  Allergies  Allergen Reactions  . Tape Rash  . Gabapentin     Headache, "felt weird in the head"  . Sulfa Antibiotics   . Allopurinol Rash  . Shellfish Allergy Rash    Past Medical History:  Diagnosis Date  . Acute myeloid leukemia ([place])   . Arthritis   . Cancer [facility]) July 2014   T2,N2a, Modified radical mastectomy. 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 COPD (chronic obstructive pulmonary disease) ([place])   . Degenerative disc disease, lumbar   . Hypertension   . Malignant neoplasm of breast (female), unspecified site July 2014   Her case was presented at the [facility] tumor board in July 2014. Recommendations were for chest  wall/peripheral lymphatic radiation if metastatic disease was not identified, anti-estrogen therapy only if additional metastatic disease was detected on a PET CT. No plans for adjuvant chemotherapy if metastatic disease was identified was recommended.  . Osteoporosis   . Sciatic pain 2015  . Spinal stenosis     Past Surgical History:  Procedure Laterality Date  . ABDOMINAL HYSTERECTOMY    . BREAST SURGERY Left 04-28-13   left mastectomy, SN biopsy  . EPIDURAL BLOCK INJECTION  2015   x3    Social History   Socioeconomic History  . Marital status: Married    Spouse name: Not on file  . Number of children: Not on file  . Years of education: Not on file  . Highest education level: Not on file  Occupational History  . Not on file  Social Needs  . Financial resource strain: Not on file  . Food insecurity:    Worry: Not on file    Inability: Not on file  . Transportation needs:    Medical: Not on file    Non-medical: Not on file  Tobacco Use  . Smoking status: Never Smoker  . Smokeless tobacco: Never Used  Substance and Sexual Activity  . Alcohol use: No  . Drug use: No  . Sexual activity: Not on file  Lifestyle  . Physical activity:  Days per week: Not on file    Minutes per session: Not on file  . Stress: Not on file  Relationships  . Social connections:    Talks on phone: Not on file    Gets together: Not on file    Attends religious service: Not on file    Active member of club or organization: Not on file    Attends meetings of clubs or organizations: Not on file    Relationship status: Not on file  . Intimate partner violence:    Fear of current or ex partner: Not on file    Emotionally abused: Not on file    Physically abused: Not on file    Forced sexual activity: Not on file  Other Topics Concern  . Not on file  Social History Narrative  . Not on file    Family History  Problem Relation Age of Onset  . Stroke Mother   . Heart disease Father   .  Stroke Father   . Parkinson's disease Sister   . Parkinson's disease Brother      Current Outpatient Medications:  .  alendronate (FOSAMAX) 70 MG tablet, TAKE 1 TABLET BY MOUTH EVERY 7 DAYS. TAKE WITH A FULL GLASS OF WATER ON AN EMPTY STOMACH., Disp: 12 tablet, Rfl: 3 .  amoxicillin-clavulanate (AUGMENTIN) 875-125 MG tablet, Take 1 tablet by mouth 2 (two) times daily., Disp: 60 tablet, Rfl: 0 .  azelastine (ASTELIN) 0.1 % nasal spray, Place into both nostrils 2 (two) times daily as needed. Use in each nostril as directed , Disp: , Rfl:  .  b complex vitamins tablet, Take 1 tablet by mouth daily., Disp: , Rfl:  .  docusate sodium (COLACE) 100 MG capsule, Take 100 mg by mouth daily., Disp: , Rfl:  .  famotidine (PEPCID) 10 MG tablet, Take 10 mg by mouth daily as needed for heartburn or indigestion., Disp: , Rfl:  .  fluticasone (FLONASE) 50 MCG/ACT nasal spray, Place 1 spray into both nostrils daily as needed. , Disp: , Rfl:  .  hydrochlorothiazide (HYDRODIURIL) 25 MG tablet, TAKE 1 TABLET BY MOUTH EVERY DAY, Disp: 90 tablet, Rfl: 3 .  naproxen sodium (ALEVE) 220 MG tablet, Take 220 mg by mouth daily as needed. , Disp: , Rfl:  .  OLANZapine (ZYPREXA) 5 MG tablet, Take 1 tablet (5 mg total) by mouth at bedtime., Disp: 30 tablet, Rfl: 0 .  polyethylene glycol (MIRALAX / GLYCOLAX) packet, Take 17 g by mouth daily., Disp: , Rfl:  .  potassium chloride SA (K-DUR,KLOR-CON) 20 MEQ tablet, Take 1 tablet (20 mEq total) by mouth daily., Disp: 10 tablet, Rfl: 0 .  Saline 0.2 % SOLN, , Disp: , Rfl:  .  sertraline (ZOLOFT) 50 MG tablet, Take 1 tablet (50 mg total) by mouth daily., Disp: 30 tablet, Rfl: 5 .  valACYclovir (VALTREX) 500 MG tablet, Take 1 tablet (500 mg total) by mouth daily., Disp: 30 tablet, Rfl: 3 .  LORazepam (ATIVAN) 0.5 MG tablet, Take 1 tablet (0.5 mg total) by mouth every 6 (six) hours as needed (Nausea or vomiting). (Patient not taking: Reported on 09/23/2018), Disp: 30 tablet, Rfl: 0 .   magic mouthwash SOLN, Take 5 mLs by mouth 4 (four) times daily as needed for mouth pain. (Patient not taking: Reported on 09/23/2018), Disp: 240 mL, Rfl: 0 .  ondansetron (ZOFRAN) 8 MG tablet, Take 1 tablet (8 mg total) by mouth 2 (two) times daily as needed (Nausea or vomiting). (Patient  not taking: Reported on 09/23/2018), Disp: 30 tablet, Rfl: 1 .  prochlorperazine (COMPAZINE) 10 MG tablet, Take 1 tablet (10 mg total) by mouth every 6 (six) hours as needed (Nausea or vomiting). (Patient not taking: Reported on 09/23/2018), Disp: 30 tablet, Rfl: 1 No current facility-administered medications for this visit.   Facility-Administered Medications Ordered in Other Visits:  .  heparin lock flush 100 unit/mL, 500 Units, Intracatheter, Daily PRN, Sindy Guadeloupe, MD .  sodium chloride flush (NS) 0.9 % injection 10 mL, 10 mL, Intracatheter, PRN, Sindy Guadeloupe, MD .  sodium chloride flush (NS) 0.9 % injection 3 mL, 3 mL, Intracatheter, PRN, Sindy Guadeloupe, MD  Physical exam:  Vitals:   09/29/18 1522  BP: 124/76  Pulse: 82  Resp: 16  Temp: (!) 102.3 F (39.1 C)  TempSrc: Tympanic  SpO2: 99%   Physical Exam Constitutional:      Comments: Frail elderly female; in wheelchair. Accompanied. No acute distress.   HENT:     Head: Normocephalic and atraumatic.     Right Ear: External ear normal.     Left Ear: External ear normal.     Mouth/Throat:     Mouth: Mucous membranes are moist.     Comments: Left mid-buccal surface- ulceration with white coating, erythema extending upwards. Possible food debris visible.  Neck:     Musculoskeletal: Normal range of motion.  Cardiovascular:     Rate and Rhythm: Normal rate and regular rhythm.  Pulmonary:     Effort: Pulmonary effort is normal.     Breath sounds: Normal breath sounds.  Musculoskeletal:     Right lower leg: Edema (bruising right lower leg) present.     Comments: Gait impairment  Skin:    General: Skin is warm and dry.     Findings:  Bruising (multiple wide spread bruises) present.  Neurological:     Mental Status: She is alert and oriented to person, place, and time.  Psychiatric:        Mood and Affect: Mood normal.        Behavior: Behavior normal.     CMP Latest Ref Rng & Units 09/23/2018  Glucose 70 - 99 mg/dL 128(H)  BUN 8 - 23 mg/dL 19  Creatinine 0.44 - 1.00 mg/dL 0.49  Sodium 135 - 145 mmol/L 131(L)  Potassium 3.5 - 5.1 mmol/L 3.6  Chloride 98 - 111 mmol/L 100  CO2 22 - 32 mmol/L 26  Calcium 8.9 - 10.3 mg/dL 9.0  Total Protein 6.5 - 8.1 g/dL 5.7(L)  Total Bilirubin 0.3 - 1.2 mg/dL 0.7  Alkaline Phos 38 - 126 U/L 91  AST 15 - 41 U/L 16  ALT 0 - 44 U/L 22   CBC Latest Ref Rng & Units 09/29/2018  WBC 4.0 - 10.5 K/uL 1.6(L)  Hemoglobin 12.0 - 15.0 g/dL 7.4(L)  Hematocrit 36.0 - 46.0 % 21.8(L)  Platelets 150 - 400 K/uL PENDING    No images are attached to the encounter.  No results found.  Assessment and plan- Patient is a 84 y.o. female diagnosed with refractory AML who presents to symptom management clinic for fever.  1.  Refractory AML-currently on Vidaza s/p C4D2 with severe pancytopenia and no recover of counts. She is transfusion dependent. Case discussed with Dr. Janese Banks who recommends hospitalization and subsequent patient refusal. She recommends holding vidaza today given fever and neutropenia. Continue to provide platelet and blood transfusions twice weekly. Patient scheduled to follow up with [facility]/Dr. Janene Madeira for bone marrow  biopsy. On 10/06/18. Hold vidaza today given febrile   2.  Febrile neutropenia-oral temp 100.6, WBC 1.6, ANC 0.1.  Discussed recommendation for hospitalization for fluids, observation, and infection work-up.  Patient declined (see below).  IV fluids given in clinic today.  Blood cultures, UA, chest x-ray pending. Temp 100.0 post fluids. Advised to monitor temp overnight with ER if worsening symptoms.   3.  Pneumonia- acute pneumonia of left lower lobe as seen on chest x-ray.  Start doxycycline 100 mg bid x 7 days.   4. Mouth sore-likely associated with vidaza vs trauma; potential secondary infection.  Could consider debridement (hydrogen peroxide) and topical triamcinolone dental paste for comfort if persistent.  5.  Goals of care-patient and family member again described general functional decline over the past several months with weight loss and poor appetite.  We again discussed symptoms as sequelae associated with her refractory AML.  We discussed that today given her febrile neutropenia I recommend hospitalization (see below). She declines hospitalization citing concern for her husband who is at home alone.  She says she feels well and requests discharge to home.   rtc on 09/30/18 for blood and platelets as scheduled. Patient advised to notify the clinic if there is no improvement in symptoms or if symptoms worsen in next 1-2 days.     Visit Diagnosis 1. Acute myeloid leukemia not having achieved remission ([place])   2. Febrile neutropenia ([facility])   3. Pneumonia of left lower lobe due to infectious organism ([place])   4. Goals of care, counseling/discussion     Patient expressed understanding and was in agreement with this plan. She also understands that She can call clinic at any time with any questions, concerns, or complaints.   Thank you for allowing me to participate in the care of this very pleasant patient.   Beckey Rutter, DNP, AGNP-C [place] at [facility] [phone] (work cell) [phone] (office)

## 2018-09-30 ENCOUNTER — Other Ambulatory Visit: Payer: Self-pay

## 2018-09-30 ENCOUNTER — Encounter: Payer: Self-pay | Admitting: *Deleted

## 2018-09-30 ENCOUNTER — Inpatient Hospital Stay: Payer: Medicare Other

## 2018-09-30 ENCOUNTER — Other Ambulatory Visit: Payer: Self-pay | Admitting: *Deleted

## 2018-09-30 ENCOUNTER — Telehealth: Payer: Self-pay | Admitting: *Deleted

## 2018-09-30 DIAGNOSIS — D649 Anemia, unspecified: Secondary | ICD-10-CM

## 2018-09-30 DIAGNOSIS — E871 Hypo-osmolality and hyponatremia: Secondary | ICD-10-CM

## 2018-09-30 DIAGNOSIS — C9200 Acute myeloblastic leukemia, not having achieved remission: Secondary | ICD-10-CM

## 2018-09-30 DIAGNOSIS — D696 Thrombocytopenia, unspecified: Secondary | ICD-10-CM

## 2018-09-30 DIAGNOSIS — D61818 Other pancytopenia: Secondary | ICD-10-CM | POA: Diagnosis not present

## 2018-09-30 DIAGNOSIS — R110 Nausea: Secondary | ICD-10-CM | POA: Diagnosis not present

## 2018-09-30 DIAGNOSIS — R5383 Other fatigue: Secondary | ICD-10-CM | POA: Diagnosis not present

## 2018-09-30 LAB — URINALYSIS, COMPLETE (UACMP) WITH MICROSCOPIC
BILIRUBIN URINE: NEGATIVE
Bacteria, UA: NONE SEEN
Glucose, UA: NEGATIVE mg/dL
HGB URINE DIPSTICK: NEGATIVE
Ketones, ur: NEGATIVE mg/dL
Leukocytes, UA: NEGATIVE
Nitrite: NEGATIVE
Protein, ur: 30 mg/dL — AB
Specific Gravity, Urine: 1.023 (ref 1.005–1.030)
pH: 6 (ref 5.0–8.0)

## 2018-09-30 MED ORDER — DOXYCYCLINE HYCLATE 100 MG PO TABS: 100.0000 mg | ORAL_TABLET | Freq: Two times a day (BID) | ORAL | 0 refills | Status: AC

## 2018-09-30 MED ORDER — POTASSIUM CHLORIDE CRYS ER 20 MEQ PO TBCR: 20.0000 meq | EXTENDED_RELEASE_TABLET | Freq: Every day | ORAL | 0 refills | Status: DC

## 2018-09-30 MED ORDER — AMOXICILLIN-POT CLAVULANATE 875-125 MG PO TABS: 1.0000 | ORAL_TABLET | Freq: Two times a day (BID) | ORAL | 0 refills | Status: AC

## 2018-09-30 MED ORDER — SODIUM CHLORIDE 0.9% IV SOLUTION
250.0000 mL | Freq: Once | INTRAVENOUS | Status: AC
  Administered 2018-09-30: 250 mL via INTRAVENOUS
  Filled 2018-09-30: qty 250

## 2018-09-30 MED ORDER — ACETAMINOPHEN 325 MG PO TABS
650.0000 mg | ORAL_TABLET | Freq: Once | ORAL | Status: AC
  Administered 2018-09-30: 650 mg via ORAL

## 2018-09-30 MED ORDER — ACETAMINOPHEN 325 MG PO TABS
ORAL_TABLET | ORAL | Status: AC
  Filled 2018-09-30: qty 2

## 2018-09-30 NOTE — Telephone Encounter (Signed)
Called and left a message for patient's daughter to let her know that patient does not come in tomorrow because she is not getting by days of the rest of this week due to her pneumonia.  I did try calling the patient's house but there was no answer.  Also Dr. Janese Banks had spoke to the patient as well as the daughter while they were in clinic today and told her that she will not be getting Vidaza for the rest of this week due to her pneumonia.  She still has an appointment to come on Thursday for labs and possible treatment of blood products but no vidaza

## 2018-10-01 ENCOUNTER — Inpatient Hospital Stay: Payer: Medicare Other

## 2018-10-01 ENCOUNTER — Other Ambulatory Visit: Payer: Self-pay | Admitting: *Deleted

## 2018-10-01 DIAGNOSIS — D649 Anemia, unspecified: Secondary | ICD-10-CM

## 2018-10-01 DIAGNOSIS — C9200 Acute myeloblastic leukemia, not having achieved remission: Secondary | ICD-10-CM

## 2018-10-01 LAB — BPAM PLATELET PHERESIS
Blood Product Expiration Date: 201912192359
ISSUE DATE / TIME: 201912171301
Unit Type and Rh: 6200

## 2018-10-01 LAB — PREPARE PLATELET PHERESIS: Unit division: 0

## 2018-10-01 LAB — BPAM RBC
Blood Product Expiration Date: 201912192359
ISSUE DATE / TIME: 201912171006
Unit Type and Rh: 9500

## 2018-10-01 LAB — TYPE AND SCREEN
ABO/RH(D): A POS
Antibody Screen: NEGATIVE
Unit division: 0

## 2018-10-02 ENCOUNTER — Inpatient Hospital Stay: Payer: Medicare Other

## 2018-10-02 ENCOUNTER — Other Ambulatory Visit: Payer: Self-pay

## 2018-10-02 DIAGNOSIS — D61818 Other pancytopenia: Secondary | ICD-10-CM | POA: Diagnosis not present

## 2018-10-02 DIAGNOSIS — C9200 Acute myeloblastic leukemia, not having achieved remission: Secondary | ICD-10-CM

## 2018-10-02 DIAGNOSIS — H10233 Serous conjunctivitis, except viral, bilateral: Secondary | ICD-10-CM | POA: Diagnosis not present

## 2018-10-02 DIAGNOSIS — R5383 Other fatigue: Secondary | ICD-10-CM | POA: Diagnosis not present

## 2018-10-02 DIAGNOSIS — R110 Nausea: Secondary | ICD-10-CM | POA: Diagnosis not present

## 2018-10-02 DIAGNOSIS — D649 Anemia, unspecified: Secondary | ICD-10-CM

## 2018-10-02 LAB — COMPREHENSIVE METABOLIC PANEL
ALT: 28 U/L (ref 0–44)
AST: 16 U/L (ref 15–41)
Albumin: 2.8 g/dL — ABNORMAL LOW (ref 3.5–5.0)
Alkaline Phosphatase: 92 U/L (ref 38–126)
Anion gap: 6 (ref 5–15)
BUN: 12 mg/dL (ref 8–23)
CHLORIDE: 100 mmol/L (ref 98–111)
CO2: 27 mmol/L (ref 22–32)
Calcium: 9 mg/dL (ref 8.9–10.3)
Creatinine, Ser: 0.4 mg/dL — ABNORMAL LOW (ref 0.44–1.00)
GFR calc Af Amer: >60 mL/min (ref >60)
Glucose, Bld: 131 mg/dL — ABNORMAL HIGH (ref 70–99)
Potassium: 4.4 mmol/L (ref 3.5–5.1)
Sodium: 133 mmol/L — ABNORMAL LOW (ref 135–145)
Total Bilirubin: 0.9 mg/dL (ref 0.3–1.2)
Total Protein: 5.6 g/dL — ABNORMAL LOW (ref 6.5–8.1)

## 2018-10-02 LAB — SAMPLE TO BLOOD BANK

## 2018-10-03 ENCOUNTER — Inpatient Hospital Stay: Payer: Medicare Other

## 2018-10-03 ENCOUNTER — Inpatient Hospital Stay: Payer: Medicare Other | Admitting: Hospice and Palliative Medicine

## 2018-10-03 LAB — CBC WITH DIFFERENTIAL/PLATELET
ABS IMMATURE GRANULOCYTES: 0.08 10*3/uL — AB (ref 0.00–0.07)
Basophils Absolute: 0 10*3/uL (ref 0.0–0.1)
Basophils Relative: 0 %
EOS PCT: 0 %
Eosinophils Absolute: 0 10*3/uL (ref 0.0–0.5)
HCT: 26.5 % — ABNORMAL LOW (ref 36.0–46.0)
HEMOGLOBIN: 9.3 g/dL — AB (ref 12.0–15.0)
Immature Granulocytes: 4 %
LYMPHS ABS: 1.5 10*3/uL (ref 0.7–4.0)
Lymphocytes Relative: 67 %
MCH: 29.3 pg (ref 26.0–34.0)
MCHC: 35.1 g/dL (ref 30.0–36.0)
MCV: 83.6 fL (ref 80.0–100.0)
Monocytes Absolute: 0.5 10*3/uL (ref 0.1–1.0)
Monocytes Relative: 20 %
NRBC: 0 % (ref 0.0–0.2)
Neutro Abs: 0.2 10*3/uL — ABNORMAL LOW (ref 1.7–7.7)
Neutrophils Relative %: 9 %
Platelets: 19 10*3/uL — CL (ref 150–400)
RBC: 3.17 MIL/uL — ABNORMAL LOW (ref 3.87–5.11)
RDW: 13.5 % (ref 11.5–15.5)
WBC: 2.3 10*3/uL — AB (ref 4.0–10.5)

## 2018-10-06 DIAGNOSIS — C9200 Acute myeloblastic leukemia, not having achieved remission: Secondary | ICD-10-CM | POA: Diagnosis not present

## 2018-10-06 DIAGNOSIS — E876 Hypokalemia: Secondary | ICD-10-CM | POA: Diagnosis not present

## 2018-10-09 DIAGNOSIS — C9200 Acute myeloblastic leukemia, not having achieved remission: Secondary | ICD-10-CM | POA: Diagnosis not present

## 2018-10-09 DIAGNOSIS — Z9221 Personal history of antineoplastic chemotherapy: Secondary | ICD-10-CM | POA: Diagnosis not present

## 2018-10-09 DIAGNOSIS — D759 Disease of blood and blood-forming organs, unspecified: Secondary | ICD-10-CM | POA: Diagnosis not present

## 2018-10-09 DIAGNOSIS — D61818 Other pancytopenia: Secondary | ICD-10-CM | POA: Diagnosis not present

## 2018-10-09 DIAGNOSIS — D469 Myelodysplastic syndrome, unspecified: Secondary | ICD-10-CM | POA: Diagnosis not present

## 2018-10-10 DIAGNOSIS — H10233 Serous conjunctivitis, except viral, bilateral: Secondary | ICD-10-CM | POA: Diagnosis not present

## 2018-10-11 ENCOUNTER — Telehealth: Payer: Self-pay | Admitting: Physician Assistant

## 2018-10-11 NOTE — Telephone Encounter (Signed)
Patient sees Dr. Matilde Sprang for her eyes. She is a patient of Dr. Marlan Palau who is on Chemo treatments.   She is on Tobramycin eye drops every 3 hours.  She is totally out of this and Dr. Matilde Sprang is out of town.  Crystal Carpenter would like for [place] to call in her a refill to [facility] for this.  Please let patient know when and if we can do this.

## 2018-10-13 ENCOUNTER — Other Ambulatory Visit: Payer: Self-pay | Admitting: *Deleted

## 2018-10-13 DIAGNOSIS — C9200 Acute myeloblastic leukemia, not having achieved remission: Secondary | ICD-10-CM

## 2018-10-13 MED ORDER — TOBRAMYCIN 0.3 % OP SOLN: 1.0000 [drp] | OPHTHALMIC | 0 refills | Status: AC

## 2018-10-13 NOTE — Telephone Encounter (Signed)
1 bottle of Tobramycin sent to pharmacy.  Hopefully this will hold her over until she will be able to get further refills from Dr Matilde Sprang.

## 2018-10-13 NOTE — Telephone Encounter (Signed)
Patient was advised.  

## 2018-10-14 ENCOUNTER — Inpatient Hospital Stay: Payer: Medicare Other

## 2018-10-14 ENCOUNTER — Inpatient Hospital Stay (HOSPITAL_BASED_OUTPATIENT_CLINIC_OR_DEPARTMENT_OTHER): Payer: Medicare Other | Admitting: Nurse Practitioner

## 2018-10-14 VITALS — BP 122/68 | HR 86 | Temp 98.8°F | Resp 18 | Wt 115.6 lb

## 2018-10-14 DIAGNOSIS — C9200 Acute myeloblastic leukemia, not having achieved remission: Secondary | ICD-10-CM

## 2018-10-14 DIAGNOSIS — D649 Anemia, unspecified: Secondary | ICD-10-CM

## 2018-10-14 DIAGNOSIS — R5382 Chronic fatigue, unspecified: Secondary | ICD-10-CM | POA: Diagnosis not present

## 2018-10-14 DIAGNOSIS — D61818 Other pancytopenia: Secondary | ICD-10-CM

## 2018-10-14 DIAGNOSIS — Z853 Personal history of malignant neoplasm of breast: Secondary | ICD-10-CM | POA: Diagnosis not present

## 2018-10-14 DIAGNOSIS — R110 Nausea: Secondary | ICD-10-CM | POA: Diagnosis not present

## 2018-10-14 DIAGNOSIS — Z9012 Acquired absence of left breast and nipple: Secondary | ICD-10-CM

## 2018-10-14 DIAGNOSIS — R5383 Other fatigue: Secondary | ICD-10-CM | POA: Diagnosis not present

## 2018-10-14 LAB — CBC WITH DIFFERENTIAL/PLATELET
Basophils Absolute: 0 10*3/uL (ref 0.0–0.1)
Basophils Relative: 0 %
EOS PCT: 0 %
Eosinophils Absolute: 0 10*3/uL (ref 0.0–0.5)
HCT: 22.8 % — ABNORMAL LOW (ref 36.0–46.0)
HEMOGLOBIN: 7.4 g/dL — AB (ref 12.0–15.0)
Lymphocytes Relative: 61 %
Lymphs Abs: 1.6 10*3/uL (ref 0.7–4.0)
MCH: 28.4 pg (ref 26.0–34.0)
MCHC: 32.5 g/dL (ref 30.0–36.0)
MCV: 87.4 fL (ref 80.0–100.0)
Monocytes Absolute: 0.6 10*3/uL (ref 0.1–1.0)
Monocytes Relative: 23 %
Neutro Abs: 0.3 10*3/uL — ABNORMAL LOW (ref 1.7–7.7)
Neutrophils Relative %: 12 %
Platelets: 6 10*3/uL — CL (ref 150–400)
RBC: 2.61 MIL/uL — ABNORMAL LOW (ref 3.87–5.11)
RDW: 13.2 % (ref 11.5–15.5)
Smear Review: DECREASED
WBC: 2.6 10*3/uL — ABNORMAL LOW (ref 4.0–10.5)
nRBC: 0 % (ref 0.0–0.2)

## 2018-10-14 LAB — SAMPLE TO BLOOD BANK

## 2018-10-14 LAB — PREPARE RBC (CROSSMATCH)

## 2018-10-14 MED ORDER — ACETAMINOPHEN 325 MG PO TABS
ORAL_TABLET | ORAL | Status: AC
  Filled 2018-10-14: qty 2

## 2018-10-14 MED ORDER — ACETAMINOPHEN 325 MG PO TABS
650.0000 mg | ORAL_TABLET | Freq: Once | ORAL | Status: AC
  Administered 2018-10-14: 650 mg via ORAL

## 2018-10-14 MED ORDER — SODIUM CHLORIDE 0.9% IV SOLUTION
250.0000 mL | Freq: Once | INTRAVENOUS | Status: AC
  Administered 2018-10-14: 250 mL via INTRAVENOUS
  Filled 2018-10-14: qty 250

## 2018-10-14 NOTE — Progress Notes (Signed)
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 She has been referred to [place] for anemia. Recent CBC from 09/20/2017 showed white count of 5.6, H&H of 9.6/29.8 and a platelet count of 105. TSH was normal at 1.11. CMP was within normal limits. Serum iron was normal at 103. B12 levels were elevated at 1905 and folate was normal at greater than 24. Stool FOBT was negative.  Patient lives with her 5 year old husband and is independent of her ADLs and IADLs. She is still driving. She does feel fatigued on and off but today she reports feeling well. She denies any blood in her stool or urine.  Results of blood work from 10/21/2017 were as follows: CBC showed white count of 10.6, H&H of 10.1/31.9 with a platelet count of 210. Iron studies, haptoglobin and reticulocyte count was normal. Multiple myeloma panel showed no monoclonal protein and IFE was normal  Patients hb dropped down to 6.4 in may 2019. She received 2 units of prbc transfusion. She also had repeat anemia work up which was unremarkable. She then had progressive leucocytosis with wbc in 20's and left shift. She underwent bone marrow biopsy on 04/03/18. Noted to have 19% myeloid blasts in the marrow and 20% blasts in peripheral blood  consistent with acute myeloid leukemia  NGS panel showed NF1 but no other mutations. She was seen by Dr. Janene Madeira at [facility]. She is curently on venetoclax. She is receiving Jay vidaza 7 days a month 1st dose was on 04/23/18  Bone marrow biopsy on day 20 postvidazastill showed 19% blasts.Patient was continued on Vidaza and venetoclax and repeat bone marrow biopsy at 2 months showed less than 5% blasts.   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  Multiple second line options have been discussed with the patient and she is chosen to proceed with single agent Vidaza for palliation  Interval history-patient returns to clinic today for reevaluation.  She thinks that she is doing reasonably well continues to live at home with her husband.  She occasionally has bruising but feels secondary to injuries.  Has fatigue which is chronic and unchanged.  She was previously diagnosed with pneumonia and treated with doxycycline. She denies, dyspnea on exertion, palpitations, or chest pain.  She denies recurrent infections.  No unexplained pains or masses.   Patient seen at [facility] by Dr. Janene Madeira.  Bone marrow biopsy on 10/06/2018 revealed hypercellular bone marrow 80% with persistent myeloid leukemia, immature mononuclear cells were present greater than 90% of marrow cellularity by morphologic assessment.  Cytogenetics pending.  Peripheral blood smear review revealed pancytopenia with 20% blasts by manual count.  Per his note, awaiting mutational profile and CD33 expression.  If negative possible clinical trial could be considered.  However, patient says today that she is not interested in clinical trials as  they keep her away from home travel is difficult for her.  She is interested in continuing transfusions and is not interested in hospice at this time.  ECOG PS- 2 Pain scale- 0 Opioid associated constipation-  no  Review of systems- Review of Systems  Constitutional: Positive for malaise/fatigue. Negative for chills, fever and weight loss.  HENT: Negative for congestion, ear discharge and nosebleeds.   Eyes: Negative for blurred vision.  Respiratory: Negative for cough, hemoptysis, sputum production, shortness of breath and wheezing.   Cardiovascular: Negative for chest pain, palpitations, orthopnea and claudication.  Gastrointestinal: Negative for abdominal pain, blood in stool, constipation, diarrhea, heartburn, melena, nausea and vomiting.  Genitourinary: Negative for dysuria, flank pain, frequency, hematuria and urgency.  Musculoskeletal: Negative for back pain, joint pain and myalgias.  Skin: Negative for rash.  Neurological: Negative for dizziness, tingling, focal weakness, seizures, weakness and headaches.  Endo/Heme/Allergies: Does not bruise/bleed easily.  Psychiatric/Behavioral: Negative for depression and suicidal ideas. The patient does not have insomnia.       Allergies  Allergen Reactions  . Tape Rash  . Gabapentin     Headache, "felt weird in the head"  . Sulfa Antibiotics   . Allopurinol Rash  . Shellfish Allergy Rash     Past Medical History:  Diagnosis Date  . Acute myeloid leukemia ([place])   . Arthritis   . Cancer [facility]) July 2014   T2,N2a, Modified radical mastectomy. 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 COPD (chronic obstructive pulmonary disease) ([place])   . Degenerative disc disease, lumbar   . Hypertension   . Malignant neoplasm of breast (female), unspecified site July 2014   Her case was presented at the [facility] tumor board in July 2014. Recommendations were for chest wall/peripheral lymphatic radiation if metastatic disease was not identified, anti-estrogen therapy only if additional metastatic disease was detected on a PET CT. No plans for adjuvant chemotherapy if metastatic disease was identified was recommended.  . Osteoporosis   .  Sciatic pain 2015  . Spinal stenosis      Past Surgical History:  Procedure Laterality Date  . ABDOMINAL HYSTERECTOMY    . BREAST SURGERY Left 04-28-13   left mastectomy, SN biopsy  . EPIDURAL BLOCK INJECTION  2015   x3    Social History   Socioeconomic History  . Marital status: Married    Spouse name: Not on file  . Number of children: Not on file  . Years of education: Not on file  . Highest education level: Not on file  Occupational History  . Not on file  Social Needs  . Financial resource strain: Not on file  . Food insecurity:    Worry: Not on file    Inability: Not on file  . Transportation needs:    Medical: Not on file    Non-medical: Not on file  Tobacco Use  . Smoking status: Never Smoker  . Smokeless tobacco: Never Used  Substance and Sexual Activity  . Alcohol use: No  . Drug use: No  . Sexual activity: Not on file  Lifestyle  . Physical activity:    Days per week: Not on file    Minutes per session: Not on file  . Stress: Not on file  Relationships  . Social connections:    Talks on phone: Not on file    Gets together: Not on file    Attends religious service: Not on file    Active member of club or organization:  Not on file    Attends meetings of clubs or organizations: Not on file    Relationship status: Not on file  . Intimate partner violence:    Fear of current or ex partner: Not on file    Emotionally abused: Not on file    Physically abused: Not on file    Forced sexual activity: Not on file  Other Topics Concern  . Not on file  Social History Narrative  . Not on file    Family History  Problem Relation Age of Onset  . Stroke Mother   . Heart disease Father   . Stroke Father   . Parkinson's disease Sister   . Parkinson's disease Brother      Current Outpatient Medications:  .  alendronate (FOSAMAX) 70 MG tablet, TAKE 1 TABLET BY MOUTH EVERY 7 DAYS. TAKE WITH A FULL GLASS OF WATER ON AN EMPTY STOMACH., Disp: 12 tablet, Rfl:  3 .  amoxicillin-clavulanate (AUGMENTIN) 875-125 MG tablet, Take 1 tablet by mouth 2 (two) times daily., Disp: 60 tablet, Rfl: 0 .  azelastine (ASTELIN) 0.1 % nasal spray, Place into both nostrils 2 (two) times daily as needed. Use in each nostril as directed , Disp: , Rfl:  .  b complex vitamins tablet, Take 1 tablet by mouth daily., Disp: , Rfl:  .  docusate sodium (COLACE) 100 MG capsule, Take 100 mg by mouth daily., Disp: , Rfl:  .  famotidine (PEPCID) 10 MG tablet, Take 10 mg by mouth daily as needed for heartburn or indigestion., Disp: , Rfl:  .  fluticasone (FLONASE) 50 MCG/ACT nasal spray, Place 1 spray into both nostrils daily as needed. , Disp: , Rfl:  .  hydrochlorothiazide (HYDRODIURIL) 25 MG tablet, TAKE 1 TABLET BY MOUTH EVERY DAY, Disp: 90 tablet, Rfl: 3 .  naproxen sodium (ALEVE) 220 MG tablet, Take 220 mg by mouth daily as needed. , Disp: , Rfl:  .  OLANZapine (ZYPREXA) 5 MG tablet, Take 1 tablet (5 mg total) by mouth at bedtime., Disp: 30 tablet, Rfl: 0 .  polyethylene glycol (MIRALAX / GLYCOLAX) packet, Take 17 g by mouth daily., Disp: , Rfl:  .  potassium chloride SA (K-DUR,KLOR-CON) 20 MEQ tablet, Take 1 tablet (20 mEq total) by mouth daily., Disp: 10 tablet, Rfl: 0 .  Saline 0.2 % SOLN, , Disp: , Rfl:  .  sertraline (ZOLOFT) 50 MG tablet, Take 1 tablet (50 mg total) by mouth daily., Disp: 30 tablet, Rfl: 5 .  tobramycin (TOBREX) 0.3 % ophthalmic solution, Place 1 drop into both eyes every 3 (three) hours., Disp: 5 mL, Rfl: 0 .  valACYclovir (VALTREX) 500 MG tablet, Take 1 tablet (500 mg total) by mouth daily., Disp: 30 tablet, Rfl: 3 .  LORazepam (ATIVAN) 0.5 MG tablet, Take 1 tablet (0.5 mg total) by mouth every 6 (six) hours as needed (Nausea or vomiting). (Patient not taking: Reported on 09/23/2018), Disp: 30 tablet, Rfl: 0 .  magic mouthwash SOLN, Take 5 mLs by mouth 4 (four) times daily as needed for mouth pain. (Patient not taking: Reported on 09/23/2018), Disp: 240 mL,  Rfl: 0 .  ondansetron (ZOFRAN) 8 MG tablet, Take 1 tablet (8 mg total) by mouth 2 (two) times daily as needed (Nausea or vomiting). (Patient not taking: Reported on 09/23/2018), Disp: 30 tablet, Rfl: 1 .  prochlorperazine (COMPAZINE) 10 MG tablet, Take 1 tablet (10 mg total) by mouth every 6 (six) hours as needed (Nausea or vomiting). (Patient not taking: Reported on  09/23/2018), Disp: 30 tablet, Rfl: 1 No current facility-administered medications for this visit.   Facility-Administered Medications Ordered in Other Visits:  .  heparin lock flush 100 unit/mL, 500 Units, Intracatheter, Daily PRN, Sindy Guadeloupe, MD .  sodium chloride flush (NS) 0.9 % injection 10 mL, 10 mL, Intracatheter, PRN, Sindy Guadeloupe, MD .  sodium chloride flush (NS) 0.9 % injection 3 mL, 3 mL, Intracatheter, PRN, Sindy Guadeloupe, MD  Physical exam:  Vitals:   10/14/18 1005  BP: 122/68  Pulse: 86  Resp: 18  Temp: 98.8 F (37.1 C)  TempSrc: Tympanic  Weight: 115 lb 9.6 oz (52.4 kg)   Physical Exam Constitutional:      Comments: Frail elderly woman sitting in a wheelchair.  Appears in no acute distress. Accompanied by daughter.   HENT:     Head: Normocephalic and atraumatic.  Eyes:     Pupils: Pupils are equal, round, and reactive to light.  Neck:     Musculoskeletal: Normal range of motion.  Cardiovascular:     Rate and Rhythm: Normal rate and regular rhythm.     Heart sounds: Normal heart sounds.  Pulmonary:     Effort: Pulmonary effort is normal.     Breath sounds: Normal breath sounds.  Abdominal:     General: Bowel sounds are normal.     Palpations: Abdomen is soft.  Skin:    General: Skin is warm and dry.     Findings: Bruising present.  Neurological:     Mental Status: She is alert and oriented to person, place, and time.  Psychiatric:        Mood and Affect: Mood normal.        Behavior: Behavior normal.      CMP Latest Ref Rng & Units 10/02/2018  Glucose 70 - 99 mg/dL 131(H)  BUN 8 - 23  mg/dL 12  Creatinine 0.44 - 1.00 mg/dL 0.40(L)  Sodium 135 - 145 mmol/L 133(L)  Potassium 3.5 - 5.1 mmol/L 4.4  Chloride 98 - 111 mmol/L 100  CO2 22 - 32 mmol/L 27  Calcium 8.9 - 10.3 mg/dL 9.0  Total Protein 6.5 - 8.1 g/dL 5.6(L)  Total Bilirubin 0.3 - 1.2 mg/dL 0.9  Alkaline Phos 38 - 126 U/L 92  AST 15 - 41 U/L 16  ALT 0 - 44 U/L 28   CBC Latest Ref Rng & Units 10/14/2018  WBC 4.0 - 10.5 K/uL 2.6(L)  Hemoglobin 12.0 - 15.0 g/dL PENDING  Hematocrit 36.0 - 46.0 % PENDING  Platelets 150 - 400 K/uL PENDING     Assessment and plan- Patient is a 84 y.o. female  with refractory aml here for re-evaluation.   She completed 4 cycles of vidaza on 09/26/18.  Unfortunately, she continues to have severe pancytopenia with no recovery of counts.  Per Dr. Janese Banks, thought to be secondary to underlying leukemia.  Her peripheral blood blasts have remained less than 5%.  She was seen by Dr. Janene Madeira with repeat bone marrow on 10/06/18 which showed persistent cytopenia and transfusion dependence. Given that she was not benefitting from azacitidine, it was recommended stopping. We again discussed that median overall survival for patients after failing to respond to Vidaza + Venetoclax is approximately 2.5 months and that we do not have additional treatment options to offer her at this time beyond clinical trials as discussed Dr. Janene Madeira.  I introduced palliative care and hospice including rationale and care differences. I advised that she would not receive transfusions  with hospice care and that estimated survival would be days to weeks in that setting. She reports that she has completed a DNR and MOST form which she has at home.  She states that she generally feels well and is ready to pursue hospice care at this time. She verbalizes understanding that her leukemia will likely continue to progress being off treatment and transfusions are palliative in nature. Therefore, we will plan for her to receive  transfusion and 1 unit of PRBCs and platelets today for platelet count of 6 and Hemoglobin 7.4.    She will follow up with Dr. Janene Madeira later this week and I will plan for her to rtc on 10/17/17 for labs and possible platelets vs blood then again next week for labs, follow up with Dr. Janese Banks, and possible blood plus or minus platelets. We will continue to plan for CBC with hold tube checked twice a week for possible platelet and/or blood transfusions.  Visit Diagnosis 1. Acute myeloid leukemia not having achieved remission ([place])   2. Symptomatic anemia     Beckey Rutter, DNP, AGNP-C [facility] [place] (work cell) [phone] (office)   CC: Dr. Janese Banks

## 2018-10-15 LAB — PREPARE PLATELET PHERESIS: Unit division: 0

## 2018-10-15 LAB — TYPE AND SCREEN
ABO/RH(D): A POS
Antibody Screen: NEGATIVE
Unit division: 0

## 2018-10-15 LAB — BPAM RBC
Blood Product Expiration Date: 202001012359
ISSUE DATE / TIME: 201912311152
Unit Type and Rh: 9500

## 2018-10-15 LAB — BPAM PLATELET PHERESIS
Blood Product Expiration Date: 202001032359
ISSUE DATE / TIME: 201912311355
Unit Type and Rh: 7300

## 2018-10-16 ENCOUNTER — Encounter: Payer: Self-pay | Admitting: Nurse Practitioner

## 2018-10-16 DIAGNOSIS — J181 Lobar pneumonia, unspecified organism: Secondary | ICD-10-CM

## 2018-10-16 DIAGNOSIS — C9201 Acute myeloblastic leukemia, in remission: Secondary | ICD-10-CM | POA: Diagnosis not present

## 2018-10-16 DIAGNOSIS — J189 Pneumonia, unspecified organism: Secondary | ICD-10-CM | POA: Insufficient documentation

## 2018-10-16 DIAGNOSIS — R5081 Fever presenting with conditions classified elsewhere: Secondary | ICD-10-CM

## 2018-10-16 DIAGNOSIS — C9200 Acute myeloblastic leukemia, not having achieved remission: Secondary | ICD-10-CM | POA: Diagnosis not present

## 2018-10-16 DIAGNOSIS — D709 Neutropenia, unspecified: Secondary | ICD-10-CM | POA: Insufficient documentation

## 2018-10-17 ENCOUNTER — Inpatient Hospital Stay: Payer: Medicare Other

## 2018-10-17 ENCOUNTER — Other Ambulatory Visit: Payer: Medicare Other

## 2018-10-17 ENCOUNTER — Ambulatory Visit: Payer: Medicare Other

## 2018-10-20 ENCOUNTER — Other Ambulatory Visit: Payer: Self-pay | Admitting: Oncology

## 2018-10-20 ENCOUNTER — Telehealth: Payer: Self-pay | Admitting: *Deleted

## 2018-10-20 DIAGNOSIS — E876 Hypokalemia: Secondary | ICD-10-CM | POA: Diagnosis not present

## 2018-10-20 DIAGNOSIS — C9200 Acute myeloblastic leukemia, not having achieved remission: Secondary | ICD-10-CM | POA: Diagnosis not present

## 2018-10-20 DIAGNOSIS — D61818 Other pancytopenia: Secondary | ICD-10-CM | POA: Diagnosis not present

## 2018-10-20 NOTE — Telephone Encounter (Signed)
Manuela Schwartz the daughter of Ms. Emiliano Dyer just called today to inform us that we can cancel your appointment for the patient tomorrow.  Patient is currently at [facility] and has decided to join a clinical trial.  I told Manuela Schwartz that we would cancel the appointment for tomorrow but please let us know when she needs anything from [place] regional standpoint.  Manuela Schwartz had mentioned that if she joins a clinical trial there may be times where she needs lab work and possible blood here locally and will let us know once they figure out what clinical trial and what the ins and outs of the clinical trial will be. Manuela Schwartz will keep [place] updated

## 2018-10-20 NOTE — Telephone Encounter (Signed)
...     Ref Range & Units 2wk ago (10/02/18) 3wk ago (09/29/18) 3wk ago (09/23/18) 103mo ago (09/09/18) 70mo ago (08/26/18) 54mo ago (08/12/18) 57mo ago (07/31/18)  Potassium 3.5 - 5.1 mmol/L 4.4  4.4  3.6  3

## 2018-10-21 ENCOUNTER — Inpatient Hospital Stay: Payer: Medicare Other | Admitting: Oncology

## 2018-10-21 ENCOUNTER — Inpatient Hospital Stay: Payer: Medicare Other

## 2018-10-22 DIAGNOSIS — E876 Hypokalemia: Secondary | ICD-10-CM | POA: Diagnosis not present

## 2018-10-22 DIAGNOSIS — C9200 Acute myeloblastic leukemia, not having achieved remission: Secondary | ICD-10-CM | POA: Diagnosis not present

## 2018-10-23 ENCOUNTER — Telehealth: Payer: Self-pay | Admitting: *Deleted

## 2018-10-23 NOTE — Telephone Encounter (Signed)
[facility] calling asking when patient had last Azacitadine treatment. Please return call [phone]

## 2018-10-23 NOTE — Telephone Encounter (Signed)
Please let them know it was 12/13 and she received only 2 of the planned 7 doses cycle 4 due to pneumonia

## 2018-10-23 NOTE — Telephone Encounter (Signed)
CAll returned to [facility] and information relayed

## 2018-10-25 DIAGNOSIS — E876 Hypokalemia: Secondary | ICD-10-CM | POA: Diagnosis not present

## 2018-10-25 DIAGNOSIS — C9200 Acute myeloblastic leukemia, not having achieved remission: Secondary | ICD-10-CM | POA: Diagnosis not present

## 2018-10-27 DIAGNOSIS — C9200 Acute myeloblastic leukemia, not having achieved remission: Secondary | ICD-10-CM | POA: Diagnosis not present

## 2018-10-27 DIAGNOSIS — J069 Acute upper respiratory infection, unspecified: Secondary | ICD-10-CM | POA: Diagnosis not present

## 2018-10-27 DIAGNOSIS — E876 Hypokalemia: Secondary | ICD-10-CM | POA: Diagnosis not present

## 2018-10-29 DIAGNOSIS — C9200 Acute myeloblastic leukemia, not having achieved remission: Secondary | ICD-10-CM | POA: Diagnosis not present

## 2018-10-30 DIAGNOSIS — E876 Hypokalemia: Secondary | ICD-10-CM | POA: Diagnosis not present

## 2018-10-30 DIAGNOSIS — C9200 Acute myeloblastic leukemia, not having achieved remission: Secondary | ICD-10-CM | POA: Diagnosis not present

## 2018-10-31 DIAGNOSIS — E876 Hypokalemia: Secondary | ICD-10-CM | POA: Diagnosis not present

## 2018-10-31 DIAGNOSIS — C9200 Acute myeloblastic leukemia, not having achieved remission: Secondary | ICD-10-CM | POA: Diagnosis not present

## 2018-11-04 DIAGNOSIS — R05 Cough: Secondary | ICD-10-CM | POA: Diagnosis not present

## 2018-11-04 DIAGNOSIS — E876 Hypokalemia: Secondary | ICD-10-CM | POA: Diagnosis not present

## 2018-11-04 DIAGNOSIS — C7951 Secondary malignant neoplasm of bone: Secondary | ICD-10-CM | POA: Diagnosis not present

## 2018-11-04 DIAGNOSIS — R0981 Nasal congestion: Secondary | ICD-10-CM | POA: Diagnosis not present

## 2018-11-04 DIAGNOSIS — C9200 Acute myeloblastic leukemia, not having achieved remission: Secondary | ICD-10-CM | POA: Diagnosis not present

## 2018-11-04 DIAGNOSIS — Z5112 Encounter for antineoplastic immunotherapy: Secondary | ICD-10-CM | POA: Diagnosis not present

## 2018-11-04 DIAGNOSIS — R0602 Shortness of breath: Secondary | ICD-10-CM | POA: Diagnosis not present

## 2018-11-04 DIAGNOSIS — D61818 Other pancytopenia: Secondary | ICD-10-CM | POA: Diagnosis not present

## 2018-11-06 DIAGNOSIS — C9200 Acute myeloblastic leukemia, not having achieved remission: Secondary | ICD-10-CM | POA: Diagnosis not present

## 2018-11-11 DIAGNOSIS — R0602 Shortness of breath: Secondary | ICD-10-CM | POA: Diagnosis not present

## 2018-11-11 DIAGNOSIS — D61818 Other pancytopenia: Secondary | ICD-10-CM | POA: Diagnosis not present

## 2018-11-11 DIAGNOSIS — Z9221 Personal history of antineoplastic chemotherapy: Secondary | ICD-10-CM | POA: Diagnosis not present

## 2018-11-11 DIAGNOSIS — C9200 Acute myeloblastic leukemia, not having achieved remission: Secondary | ICD-10-CM | POA: Diagnosis not present

## 2018-11-11 DIAGNOSIS — R042 Hemoptysis: Secondary | ICD-10-CM | POA: Diagnosis not present

## 2018-11-11 DIAGNOSIS — R110 Nausea: Secondary | ICD-10-CM | POA: Diagnosis not present

## 2018-11-11 DIAGNOSIS — R109 Unspecified abdominal pain: Secondary | ICD-10-CM | POA: Diagnosis not present

## 2018-11-11 DIAGNOSIS — D469 Myelodysplastic syndrome, unspecified: Secondary | ICD-10-CM | POA: Diagnosis not present

## 2018-11-16 DIAGNOSIS — C9200 Acute myeloblastic leukemia, not having achieved remission: Secondary | ICD-10-CM | POA: Diagnosis not present

## 2018-11-16 DIAGNOSIS — E43 Unspecified severe protein-calorie malnutrition: Secondary | ICD-10-CM | POA: Diagnosis not present

## 2018-11-16 DIAGNOSIS — R0902 Hypoxemia: Secondary | ICD-10-CM | POA: Diagnosis not present

## 2018-11-16 DIAGNOSIS — D709 Neutropenia, unspecified: Secondary | ICD-10-CM | POA: Diagnosis not present

## 2018-11-16 DIAGNOSIS — A0472 Enterocolitis due to Clostridium difficile, not specified as recurrent: Secondary | ICD-10-CM | POA: Diagnosis not present

## 2018-11-16 DIAGNOSIS — R9431 Abnormal electrocardiogram [ECG] [EKG]: Secondary | ICD-10-CM | POA: Diagnosis not present

## 2018-11-16 DIAGNOSIS — E876 Hypokalemia: Secondary | ICD-10-CM | POA: Diagnosis not present

## 2018-11-16 DIAGNOSIS — R5081 Fever presenting with conditions classified elsewhere: Secondary | ICD-10-CM | POA: Diagnosis not present

## 2018-11-16 DIAGNOSIS — R197 Diarrhea, unspecified: Secondary | ICD-10-CM | POA: Diagnosis not present

## 2018-11-16 DIAGNOSIS — M1612 Unilateral primary osteoarthritis, left hip: Secondary | ICD-10-CM | POA: Diagnosis not present

## 2018-11-16 DIAGNOSIS — R000 Tachycardia, unspecified: Secondary | ICD-10-CM | POA: Diagnosis not present

## 2018-11-16 DIAGNOSIS — R509 Fever, unspecified: Secondary | ICD-10-CM | POA: Diagnosis not present

## 2018-11-16 DIAGNOSIS — M25551 Pain in right hip: Secondary | ICD-10-CM | POA: Diagnosis not present

## 2018-11-16 DIAGNOSIS — J9811 Atelectasis: Secondary | ICD-10-CM | POA: Diagnosis not present

## 2018-11-16 DIAGNOSIS — K5732 Diverticulitis of large intestine without perforation or abscess without bleeding: Secondary | ICD-10-CM | POA: Diagnosis not present

## 2018-11-16 DIAGNOSIS — K5720 Diverticulitis of large intestine with perforation and abscess without bleeding: Secondary | ICD-10-CM | POA: Diagnosis not present

## 2018-11-16 DIAGNOSIS — A499 Bacterial infection, unspecified: Secondary | ICD-10-CM | POA: Diagnosis not present

## 2018-11-16 DIAGNOSIS — Z515 Encounter for palliative care: Secondary | ICD-10-CM | POA: Diagnosis not present

## 2018-11-16 DIAGNOSIS — R279 Unspecified lack of coordination: Secondary | ICD-10-CM | POA: Diagnosis not present

## 2018-11-16 DIAGNOSIS — K5900 Constipation, unspecified: Secondary | ICD-10-CM | POA: Diagnosis not present

## 2018-11-16 DIAGNOSIS — S79911A Unspecified injury of right hip, initial encounter: Secondary | ICD-10-CM | POA: Diagnosis not present

## 2018-11-16 DIAGNOSIS — R627 Adult failure to thrive: Secondary | ICD-10-CM | POA: Diagnosis not present

## 2018-11-16 DIAGNOSIS — J439 Emphysema, unspecified: Secondary | ICD-10-CM | POA: Diagnosis not present

## 2018-11-16 DIAGNOSIS — J90 Pleural effusion, not elsewhere classified: Secondary | ICD-10-CM | POA: Diagnosis not present

## 2018-11-16 DIAGNOSIS — R5381 Other malaise: Secondary | ICD-10-CM | POA: Diagnosis not present

## 2018-11-16 DIAGNOSIS — Z6822 Body mass index (BMI) 22.0-22.9, adult: Secondary | ICD-10-CM | POA: Diagnosis not present

## 2018-11-16 DIAGNOSIS — G92 Toxic encephalopathy: Secondary | ICD-10-CM | POA: Diagnosis not present

## 2018-11-16 DIAGNOSIS — Z66 Do not resuscitate: Secondary | ICD-10-CM | POA: Diagnosis not present

## 2018-11-16 DIAGNOSIS — R531 Weakness: Secondary | ICD-10-CM | POA: Diagnosis not present

## 2018-11-16 DIAGNOSIS — R918 Other nonspecific abnormal finding of lung field: Secondary | ICD-10-CM | POA: Diagnosis not present

## 2018-11-24 MED ORDER — CHLORHEXIDINE GLUCONATE 0.12 % MT SOLN
15.00 | OROMUCOSAL | Status: DC
Start: 2018-11-24 — End: 2018-11-24

## 2018-11-24 MED ORDER — MORPHINE SULFATE 4 MG/ML IJ SOLN
1.00 | INTRAMUSCULAR | Status: DC
Start: ? — End: 2018-11-24

## 2018-11-24 MED ORDER — FAMOTIDINE 20 MG PO TABS
10.00 | ORAL_TABLET | ORAL | Status: DC
Start: ? — End: 2018-11-24

## 2018-11-24 MED ORDER — LOPERAMIDE HCL 2 MG PO CAPS
2.00 | ORAL_CAPSULE | ORAL | Status: DC
Start: ? — End: 2018-11-24

## 2018-11-24 MED ORDER — VANCOMYCIN HCL 25 MG/ML PO SOLR
125.00 | ORAL | Status: DC
Start: 2018-11-24 — End: 2018-11-24

## 2018-11-24 MED ORDER — PIPERACILLIN-TAZOBACTAM 3.375 G IVPB
3.38 | INTRAVENOUS | Status: DC
Start: 2018-11-24 — End: 2018-11-24

## 2018-11-24 MED ORDER — VALACYCLOVIR HCL 500 MG PO TABS
500.00 | ORAL_TABLET | ORAL | Status: DC
Start: 2018-11-25 — End: 2018-11-24

## 2018-11-24 MED ORDER — ONDANSETRON HCL 4 MG/2ML IJ SOLN
8.00 | INTRAMUSCULAR | Status: DC
Start: ? — End: 2018-11-24

## 2018-11-24 MED ORDER — FLUCONAZOLE 200 MG PO TABS
400.00 | ORAL_TABLET | ORAL | Status: DC
Start: 2018-11-25 — End: 2018-11-24

## 2018-11-24 MED ORDER — DICLOFENAC SODIUM 1 % TD GEL
2.00 | TRANSDERMAL | Status: DC
Start: 2018-11-24 — End: 2018-11-24

## 2018-11-24 MED ORDER — MAGNESIUM SULFATE 2 GM/50ML IV SOLN
2.00 | INTRAVENOUS | Status: DC
Start: ? — End: 2018-11-24

## 2018-11-24 MED ORDER — SODIUM CHLORIDE 0.9 % IV SOLN
INTRAVENOUS | Status: DC
Start: ? — End: 2018-11-24

## 2018-11-24 MED ORDER — LOPERAMIDE HCL 2 MG PO CAPS
4.00 | ORAL_CAPSULE | ORAL | Status: DC
Start: ? — End: 2018-11-24

## 2018-11-24 MED ORDER — ACETAMINOPHEN 325 MG PO TABS
650.00 | ORAL_TABLET | ORAL | Status: DC
Start: ? — End: 2018-11-24

## 2018-12-14 DEATH — deceased

## 2019-08-08 IMAGING — CR DG CHEST 2V
1 series · 2 of 2 positions shown · non-contrast
Comparison: April 09, 2013

CLINICAL DATA: Cough.  History of breast carcinoma

EXAM:
CHEST - 2 VIEW

[Series 1: dg chest 2 view · 0.14mm/px · 2 of 2 slices shown]
[im 1/2]
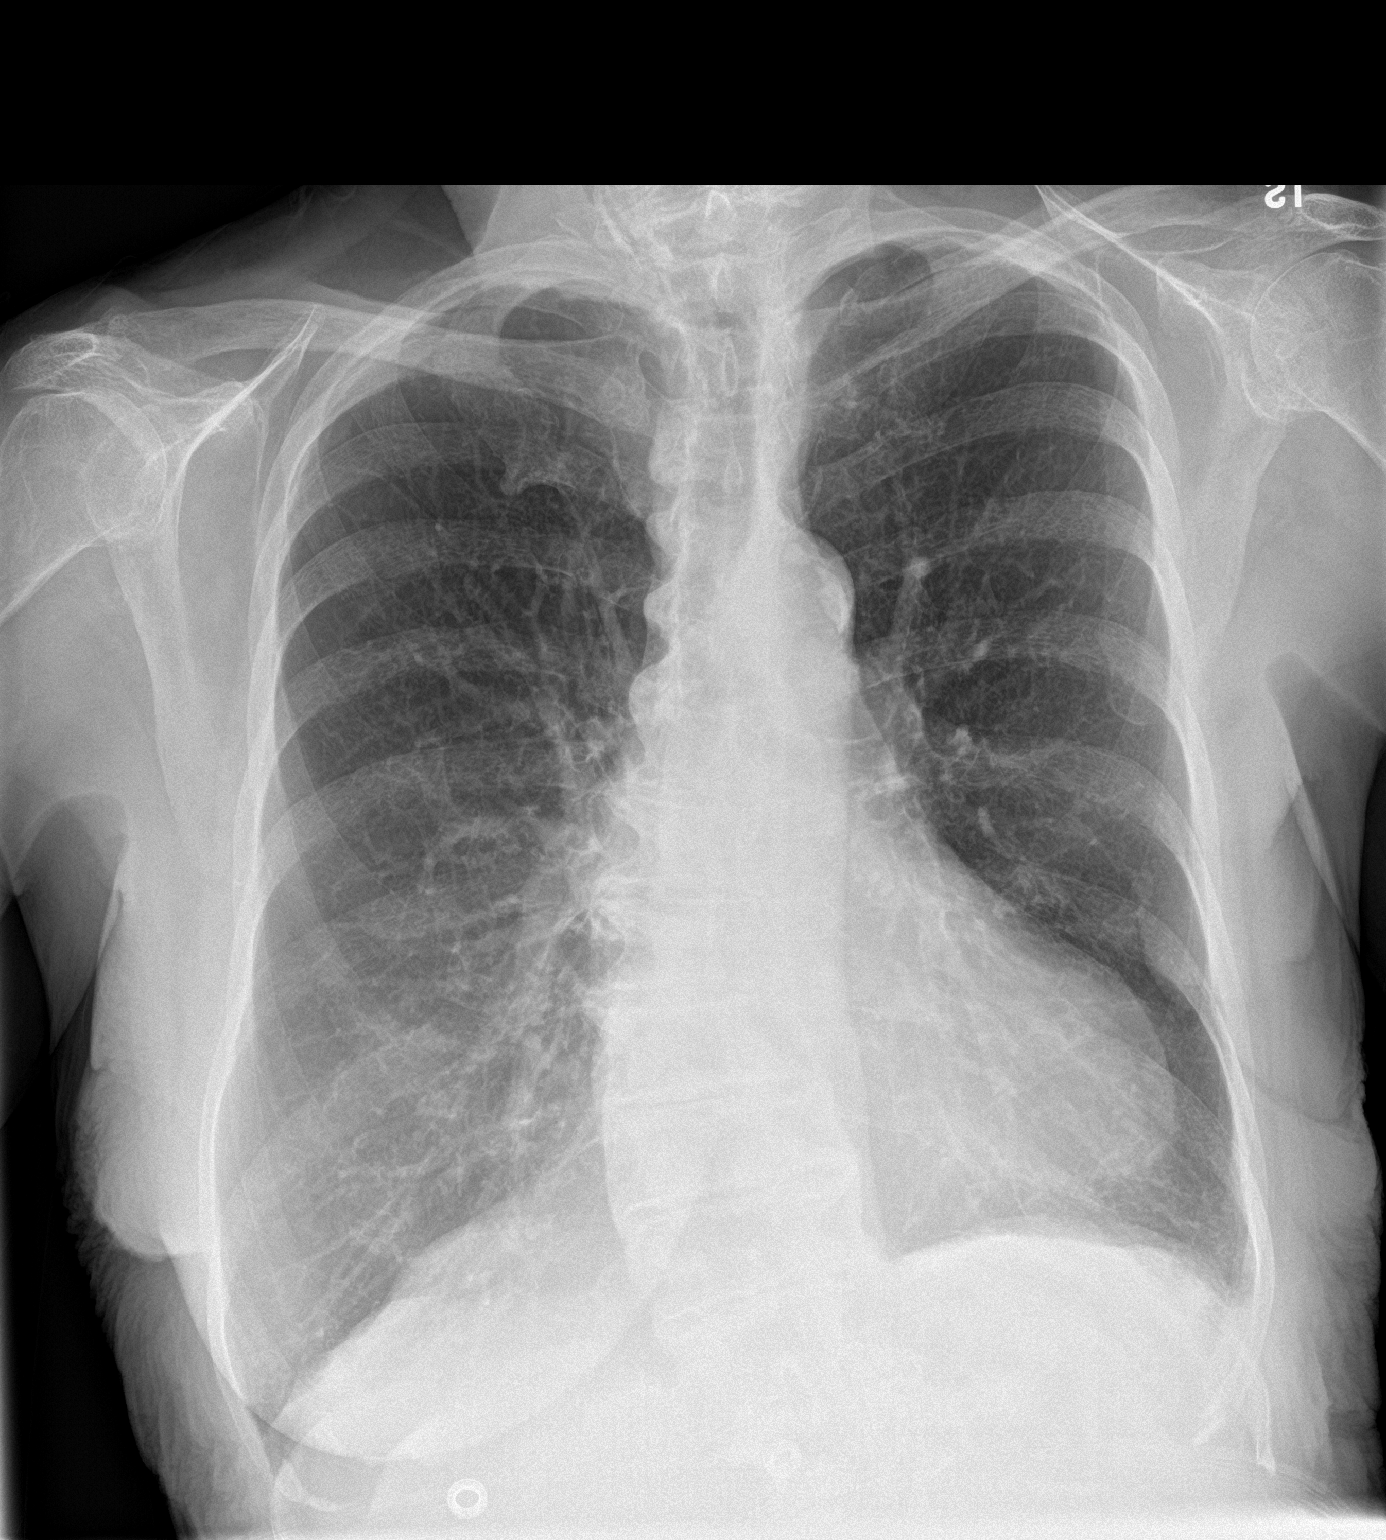
[im 2/2]
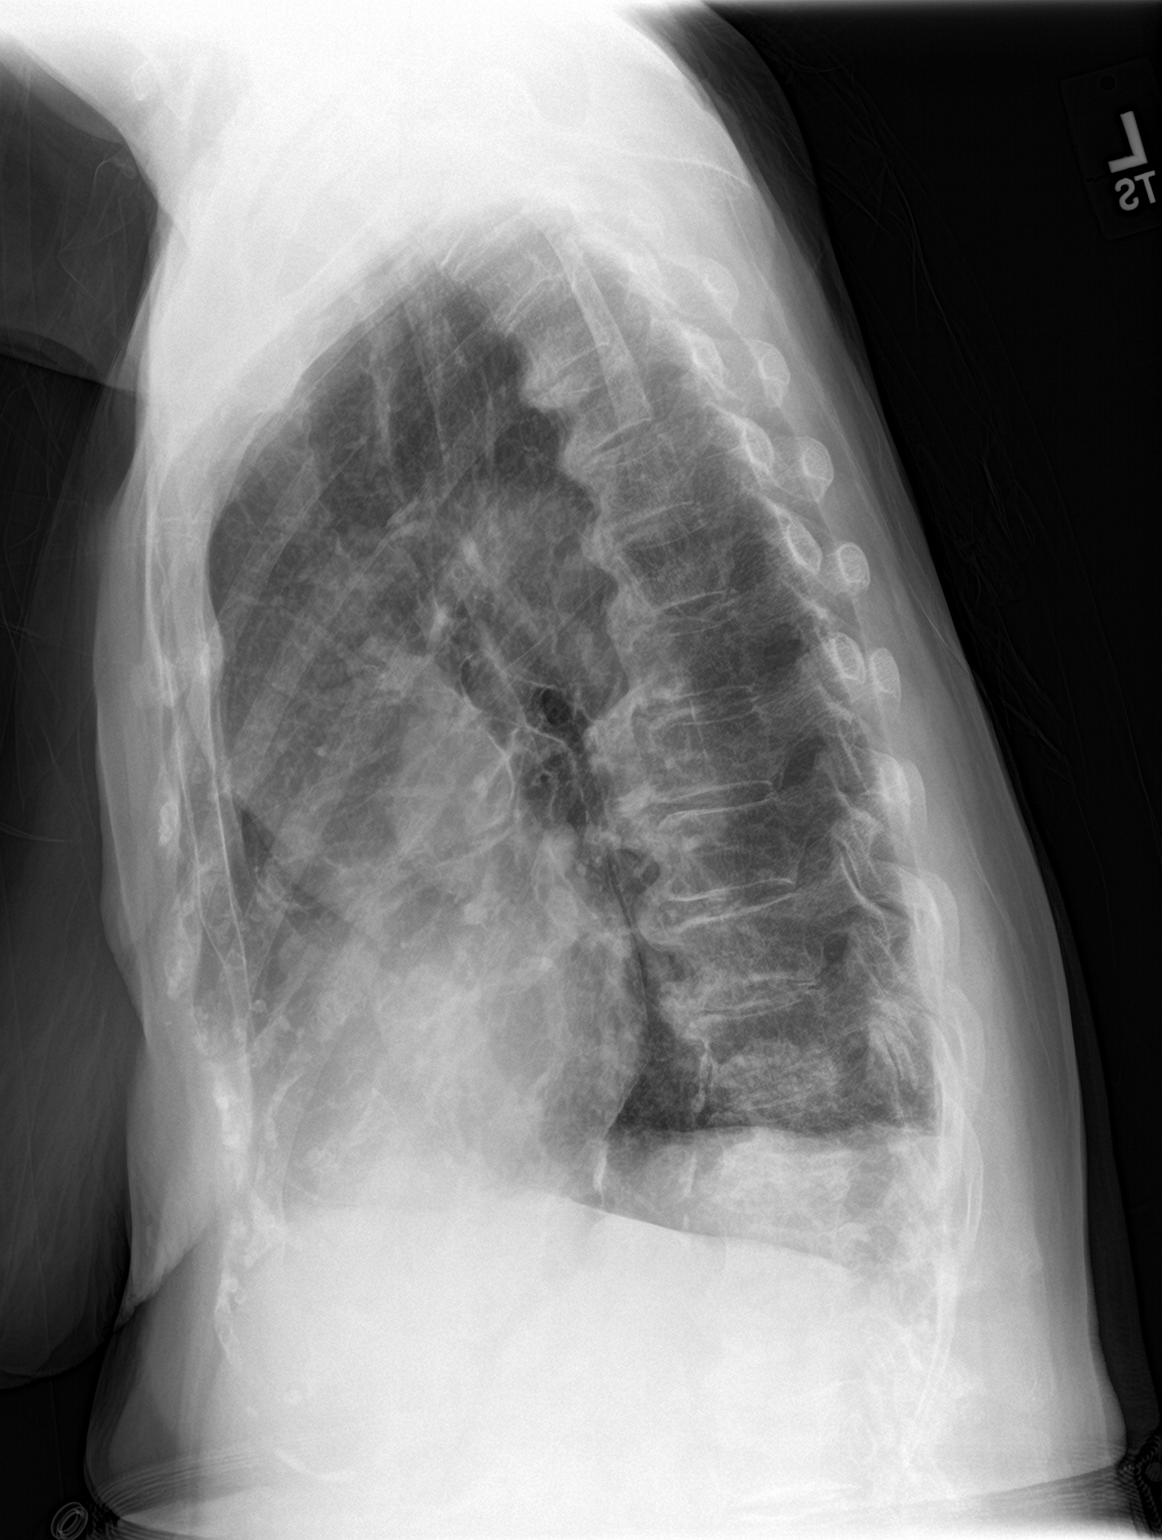

[2 of 2 positions shown; findings below may reference images not displayed]

FINDINGS: There is no edema or consolidation. Lungs are slightly
hyperexpanded. Heart is upper normal in size with pulmonary
vascularity within normal limits. No adenopathy. There is aortic
atherosclerosis. There is degenerative change in the thoracic spine.
Patient is status post left mastectomy. There is upper lumbar
levoscoliosis.
IMPRESSION: No edema or consolidation. Stable cardiac silhouette. Status post
left mastectomy. No evident adenopathy. There is aortic
atherosclerosis.

Aortic Atherosclerosis ([id number]-[id number].C).

## 2019-10-12 IMAGING — CT CT BIOPSY AND ASPIRATION BONE MARROW
1 of 2 series · 10 of 14 positions shown, 13 images · non-contrast
Comparison: none

CLINICAL DATA: Anemia of unknown etiology.  Leukocytosis.

EXAM:
CT GUIDED DEEP ILIAC BONE ASPIRATION AND CORE BIOPSY
TECHNIQUE: Patient was placed supine on the CT gantry and limited axial scans
through the pelvis were obtained. Appropriate skin entry site was
identified. Skin site was marked, prepped with chlorhexidine, draped
in usual sterile fashion, and infiltrated locally with 1% lidocaine.

[Series 2: i-spiral 5.0 b30f · axial · 0.77mm/px · z∈[-294,-200]mm · 10 of 34 slices shown, 13 images]
[im 4/34  soft-tissue]
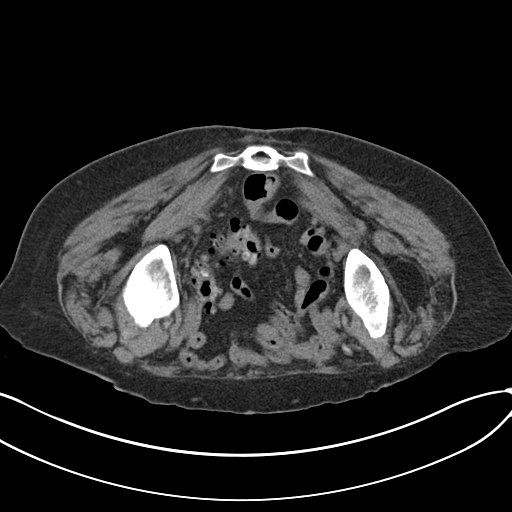
[im 4/34  bone]
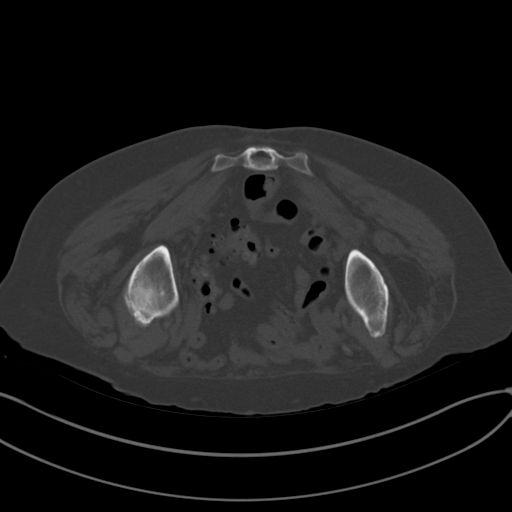
[im 7/34  bone]
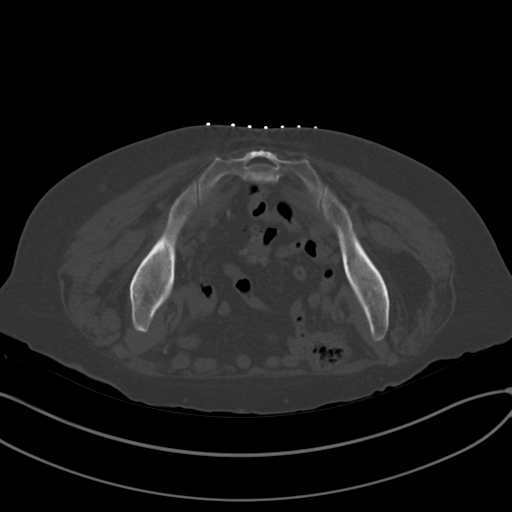
[im 10/34  bone]
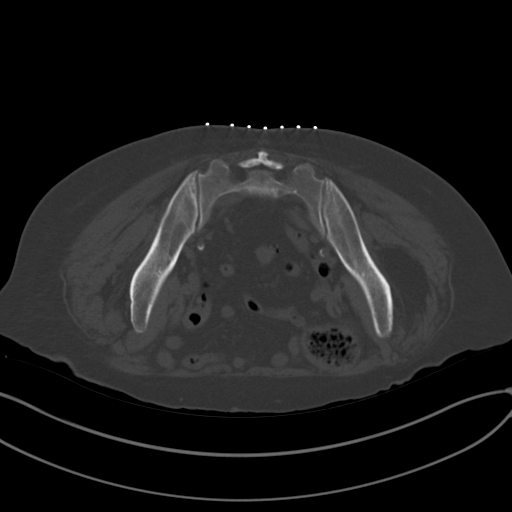
[im 13/34  bone]
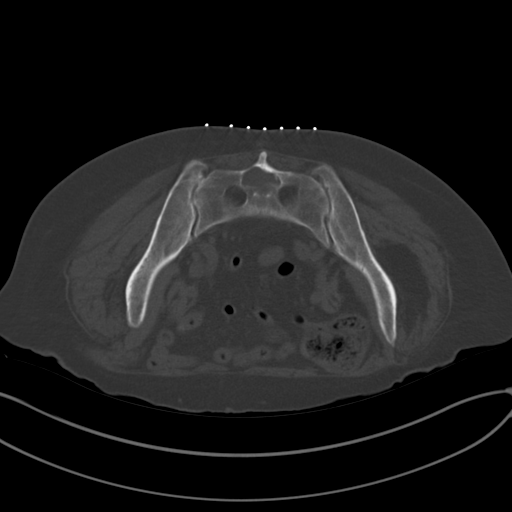
[im 16/34  soft-tissue]
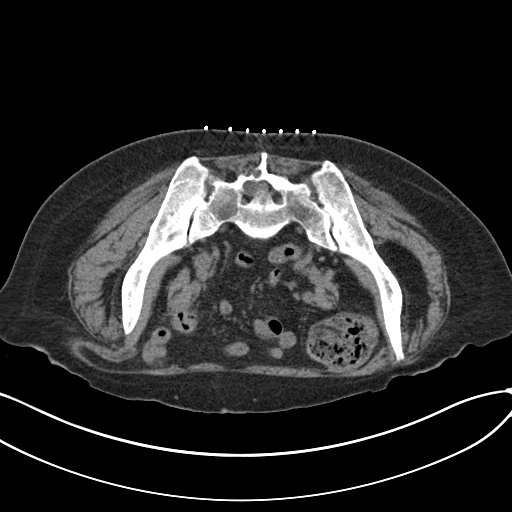
[im 16/34  bone]
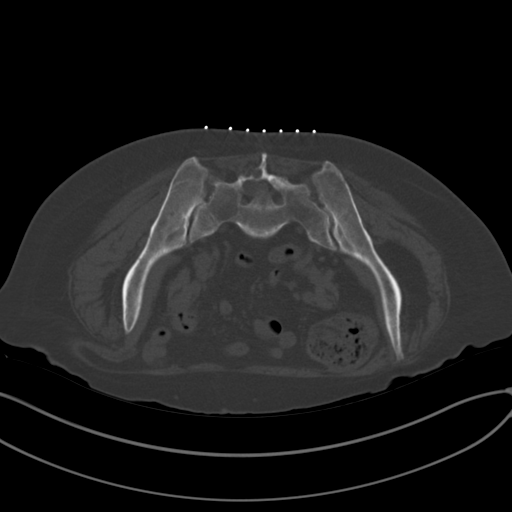
[im 19/34  bone]
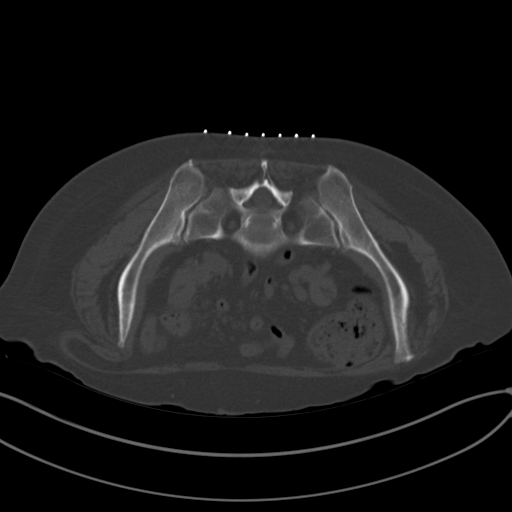
[im 22/34  bone]
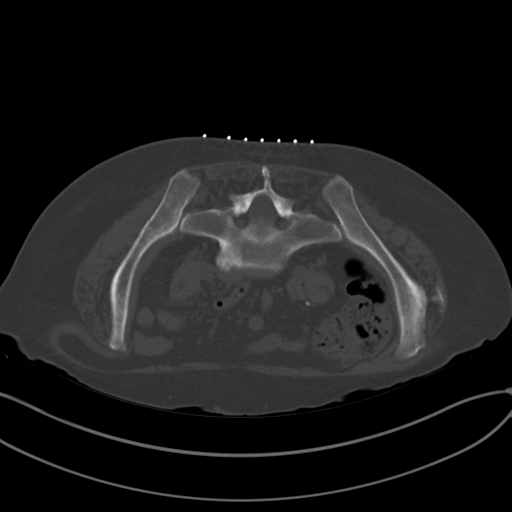
[im 25/34  bone]
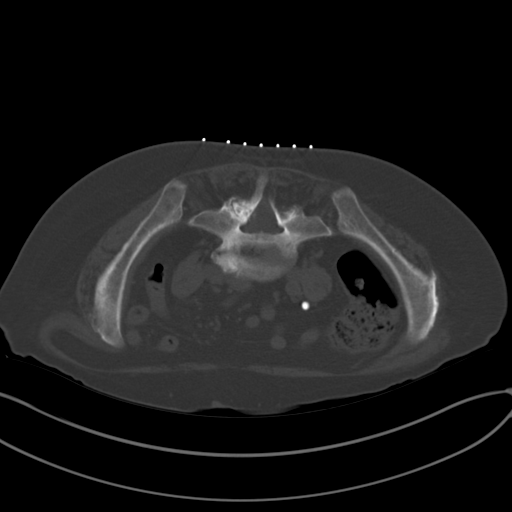
[im 28/34  soft-tissue]
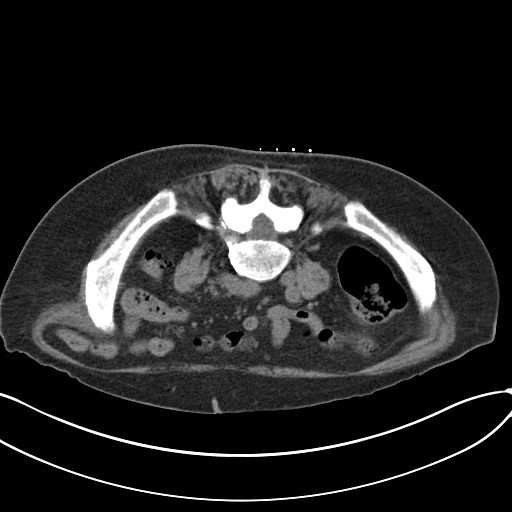
[im 28/34  bone]
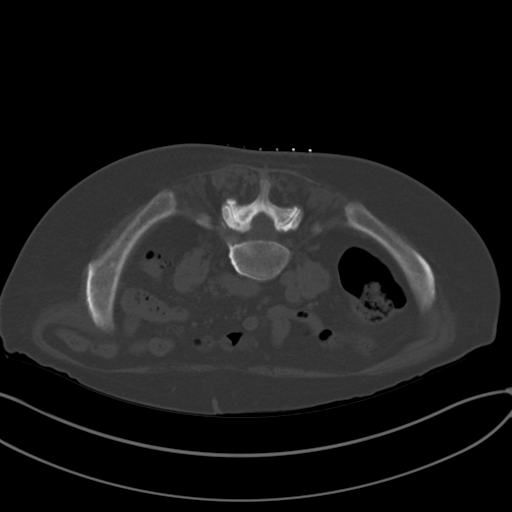
[im 31/34  bone]
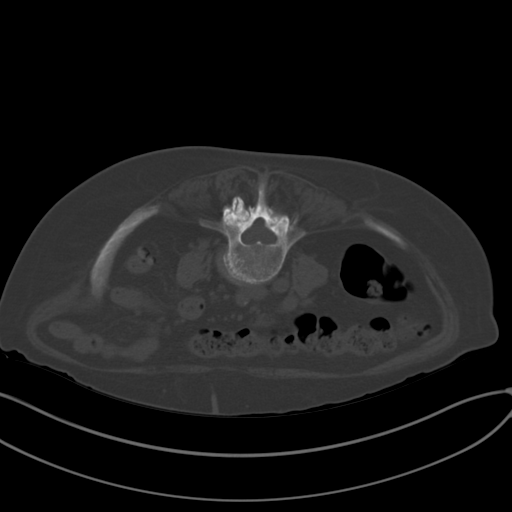

[10 of 14 positions shown; findings below may reference images not displayed]

Intravenous Fentanyl and Versed were administered as conscious
sedation during continuous monitoring of the patient's level of
consciousness and physiological / cardiorespiratory status by the
radiology RN, with a total moderate sedation time of 12 minutes.

Under CT fluoroscopic guidance an 11-gauge Cook trocar bone needle
was advanced into the right iliac bone just lateral to the
sacroiliac joint. Once needle tip position was confirmed, core and
aspiration samples were obtained, submitted to pathology for
approval. Post procedure scans show no hematoma or fracture. Patient
tolerated procedure well.

COMPLICATIONS:
COMPLICATIONS
none
IMPRESSION: 1. Technically successful CT guided right iliac bone core and
aspiration biopsy.

## 2020-02-22 IMAGING — DX DG ABDOMEN 1V
1 series · 1 of 1 positions shown · non-contrast
Comparison: None.

CLINICAL DATA: Constipation, question impaction

EXAM:
ABDOMEN - 1 VIEW

[abdomen kub]
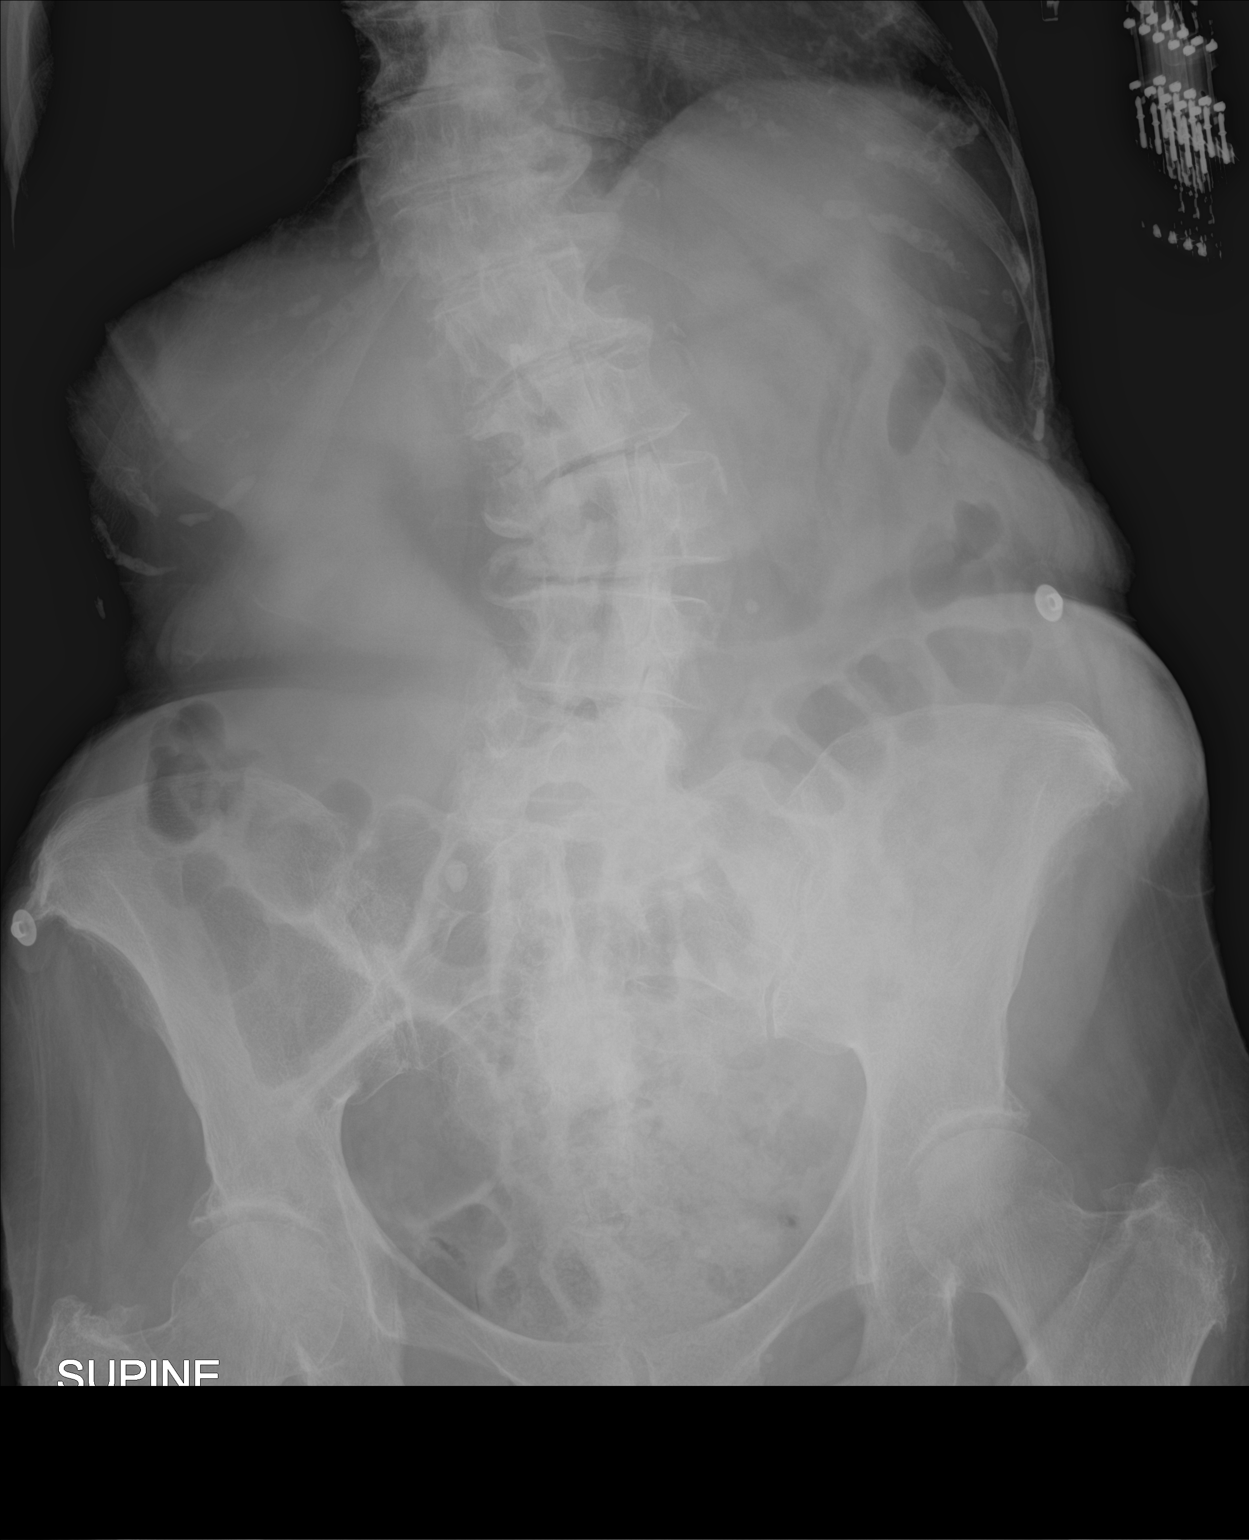

[1 of 1 positions shown; findings below may reference images not displayed]

FINDINGS: Moderate stool burden in the rectosigmoid colon. No evidence of
bowel obstruction, organomegaly or free air. Scoliosis and
degenerative changes in the lumbar spine.
IMPRESSION: Moderate stool burden in the rectosigmoid colon. No evidence of
bowel obstruction

## 2023-10-17 NOTE — Telephone Encounter (Signed)
 error
